# Patient Record
Sex: Female | Born: 1945
Health system: Southern US, Community
[De-identification: ages and names within clinical notes are randomized; demographics above are authoritative.]

## PROBLEM LIST (undated history)

## (undated) DIAGNOSIS — I10 Essential (primary) hypertension: Secondary | ICD-10-CM

## (undated) DIAGNOSIS — E119 Type 2 diabetes mellitus without complications: Secondary | ICD-10-CM

---

## 2005-05-23 ENCOUNTER — Ambulatory Visit: Payer: Self-pay | Admitting: Family Medicine

## 2005-06-03 ENCOUNTER — Ambulatory Visit: Payer: Self-pay | Admitting: Family Medicine

## 2005-06-19 ENCOUNTER — Ambulatory Visit (HOSPITAL_COMMUNITY): Admission: RE | Admit: 2005-06-19 | Discharge: 2005-06-19 | Payer: Self-pay | Admitting: Family Medicine

## 2005-07-02 ENCOUNTER — Ambulatory Visit: Payer: Self-pay | Admitting: Family Medicine

## 2005-07-30 ENCOUNTER — Ambulatory Visit: Payer: Self-pay | Admitting: *Deleted

## 2005-08-06 ENCOUNTER — Ambulatory Visit: Payer: Self-pay | Admitting: Family Medicine

## 2005-08-06 ENCOUNTER — Encounter (INDEPENDENT_AMBULATORY_CARE_PROVIDER_SITE_OTHER): Payer: Self-pay | Admitting: Family Medicine

## 2005-08-12 ENCOUNTER — Ambulatory Visit: Payer: Self-pay | Admitting: Family Medicine

## 2005-09-12 ENCOUNTER — Ambulatory Visit: Payer: Self-pay | Admitting: Family Medicine

## 2005-09-19 ENCOUNTER — Emergency Department (HOSPITAL_COMMUNITY): Admission: EM | Admit: 2005-09-19 | Discharge: 2005-09-19 | Payer: Self-pay | Admitting: Emergency Medicine

## 2005-10-14 ENCOUNTER — Ambulatory Visit: Payer: Self-pay | Admitting: Family Medicine

## 2005-11-12 ENCOUNTER — Ambulatory Visit: Payer: Self-pay | Admitting: Family Medicine

## 2005-11-14 ENCOUNTER — Ambulatory Visit: Payer: Self-pay | Admitting: Family Medicine

## 2006-01-02 ENCOUNTER — Ambulatory Visit: Payer: Self-pay | Admitting: Family Medicine

## 2006-01-09 ENCOUNTER — Ambulatory Visit: Payer: Self-pay | Admitting: Internal Medicine

## 2006-01-24 ENCOUNTER — Ambulatory Visit: Payer: Self-pay | Admitting: Internal Medicine

## 2006-03-06 ENCOUNTER — Encounter (INDEPENDENT_AMBULATORY_CARE_PROVIDER_SITE_OTHER): Payer: Self-pay | Admitting: Family Medicine

## 2006-03-06 ENCOUNTER — Ambulatory Visit: Payer: Self-pay | Admitting: Internal Medicine

## 2006-03-06 LAB — CONVERTED CEMR LAB: TSH: 1.614 microintl units/mL

## 2006-04-10 ENCOUNTER — Encounter (INDEPENDENT_AMBULATORY_CARE_PROVIDER_SITE_OTHER): Payer: Self-pay | Admitting: Family Medicine

## 2006-04-10 ENCOUNTER — Ambulatory Visit: Payer: Self-pay | Admitting: Internal Medicine

## 2006-04-10 LAB — CONVERTED CEMR LAB: Hgb A1c MFr Bld: 9.2 %

## 2006-06-02 ENCOUNTER — Ambulatory Visit: Payer: Self-pay | Admitting: Family Medicine

## 2006-06-23 ENCOUNTER — Encounter: Admission: RE | Admit: 2006-06-23 | Discharge: 2006-06-23 | Payer: Self-pay | Admitting: Internal Medicine

## 2006-07-14 ENCOUNTER — Ambulatory Visit: Payer: Self-pay | Admitting: Internal Medicine

## 2006-07-14 ENCOUNTER — Encounter (INDEPENDENT_AMBULATORY_CARE_PROVIDER_SITE_OTHER): Payer: Self-pay | Admitting: Family Medicine

## 2006-07-14 LAB — CONVERTED CEMR LAB: Hgb A1c MFr Bld: 7.4 %

## 2006-07-23 ENCOUNTER — Encounter (INDEPENDENT_AMBULATORY_CARE_PROVIDER_SITE_OTHER): Payer: Self-pay | Admitting: Family Medicine

## 2006-07-23 DIAGNOSIS — I679 Cerebrovascular disease, unspecified: Secondary | ICD-10-CM

## 2006-07-23 DIAGNOSIS — Z8679 Personal history of other diseases of the circulatory system: Secondary | ICD-10-CM | POA: Insufficient documentation

## 2006-07-23 DIAGNOSIS — E785 Hyperlipidemia, unspecified: Secondary | ICD-10-CM | POA: Insufficient documentation

## 2006-07-23 DIAGNOSIS — E119 Type 2 diabetes mellitus without complications: Secondary | ICD-10-CM

## 2006-07-23 DIAGNOSIS — E039 Hypothyroidism, unspecified: Secondary | ICD-10-CM | POA: Insufficient documentation

## 2006-07-23 DIAGNOSIS — I10 Essential (primary) hypertension: Secondary | ICD-10-CM | POA: Insufficient documentation

## 2006-07-23 DIAGNOSIS — M5410 Radiculopathy, site unspecified: Secondary | ICD-10-CM

## 2006-07-23 DIAGNOSIS — M7700 Medial epicondylitis, unspecified elbow: Secondary | ICD-10-CM

## 2006-07-23 DIAGNOSIS — E1149 Type 2 diabetes mellitus with other diabetic neurological complication: Secondary | ICD-10-CM

## 2006-10-01 ENCOUNTER — Encounter (INDEPENDENT_AMBULATORY_CARE_PROVIDER_SITE_OTHER): Payer: Self-pay | Admitting: *Deleted

## 2006-10-23 ENCOUNTER — Telehealth (INDEPENDENT_AMBULATORY_CARE_PROVIDER_SITE_OTHER): Payer: Self-pay | Admitting: *Deleted

## 2006-11-24 ENCOUNTER — Ambulatory Visit: Payer: Self-pay | Admitting: Family Medicine

## 2006-11-24 LAB — CONVERTED CEMR LAB
ALT: 14 units/L (ref 0–35)
AST: 17 units/L (ref 0–37)
Albumin: 4.6 g/dL (ref 3.5–5.2)
Blood Glucose, Fingerstick: 114
CO2: 25 meq/L (ref 19–32)
Calcium: 9.9 mg/dL (ref 8.4–10.5)
Cholesterol: 152 mg/dL (ref 0–200)
Eosinophils Relative: 2 % (ref 0–5)
Hgb A1c MFr Bld: 6.6 %
LDL Cholesterol: 61 mg/dL (ref 0–99)
Lymphocytes Relative: 44 % (ref 12–46)
Lymphs Abs: 2.7 10*3/uL (ref 0.7–4.0)
MCHC: 30.8 g/dL (ref 30.0–36.0)
Monocytes Relative: 7 % (ref 3–12)
Neutro Abs: 2.9 10*3/uL (ref 1.7–7.7)
Platelets: 301 10*3/uL (ref 150–400)
Protein, U semiquant: 30
RBC: 4.95 M/uL (ref 3.87–5.11)
RDW: 14.9 % (ref 11.5–15.5)
Specific Gravity, Urine: 1.015
TSH: 1.584 microintl units/mL (ref 0.350–5.50)
Total Bilirubin: 0.4 mg/dL (ref 0.3–1.2)
Total CHOL/HDL Ratio: 2
Triglycerides: 81 mg/dL (ref <150)
Urobilinogen, UA: 0.2
VLDL: 16 mg/dL (ref 0–40)
pH: 6

## 2007-01-26 ENCOUNTER — Ambulatory Visit: Payer: Self-pay | Admitting: Nurse Practitioner

## 2007-01-26 DIAGNOSIS — R319 Hematuria, unspecified: Secondary | ICD-10-CM

## 2007-01-26 DIAGNOSIS — N3090 Cystitis, unspecified without hematuria: Secondary | ICD-10-CM | POA: Insufficient documentation

## 2007-01-26 LAB — CONVERTED CEMR LAB
Hgb A1c MFr Bld: 7.2 %
pH: 7.5

## 2007-01-27 ENCOUNTER — Encounter (INDEPENDENT_AMBULATORY_CARE_PROVIDER_SITE_OTHER): Payer: Self-pay | Admitting: Nurse Practitioner

## 2007-01-29 ENCOUNTER — Encounter (INDEPENDENT_AMBULATORY_CARE_PROVIDER_SITE_OTHER): Payer: Self-pay | Admitting: Nurse Practitioner

## 2007-02-09 ENCOUNTER — Ambulatory Visit: Payer: Self-pay | Admitting: Family Medicine

## 2007-02-09 LAB — CONVERTED CEMR LAB
Nitrite: NEGATIVE
Specific Gravity, Urine: 1.02
pH: 5

## 2007-02-23 ENCOUNTER — Ambulatory Visit (HOSPITAL_COMMUNITY): Admission: RE | Admit: 2007-02-23 | Discharge: 2007-02-23 | Payer: Self-pay | Admitting: Family Medicine

## 2007-02-23 ENCOUNTER — Encounter (INDEPENDENT_AMBULATORY_CARE_PROVIDER_SITE_OTHER): Payer: Self-pay | Admitting: Family Medicine

## 2007-03-25 ENCOUNTER — Telehealth (INDEPENDENT_AMBULATORY_CARE_PROVIDER_SITE_OTHER): Payer: Self-pay | Admitting: *Deleted

## 2007-06-22 ENCOUNTER — Ambulatory Visit: Payer: Self-pay | Admitting: Family Medicine

## 2007-06-22 DIAGNOSIS — M899 Disorder of bone, unspecified: Secondary | ICD-10-CM | POA: Insufficient documentation

## 2007-06-22 DIAGNOSIS — M949 Disorder of cartilage, unspecified: Secondary | ICD-10-CM

## 2007-06-22 LAB — CONVERTED CEMR LAB: Blood Glucose, Fingerstick: 122

## 2007-07-07 LAB — CONVERTED CEMR LAB
ALT: 12 units/L (ref 0–35)
Basophils Relative: 0 % (ref 0–1)
Calcium: 9.1 mg/dL (ref 8.4–10.5)
Chloride: 105 meq/L (ref 96–112)
Creatinine, Ser: 0.6 mg/dL (ref 0.40–1.20)
HDL: 78 mg/dL (ref >39)
Hemoglobin: 12.4 g/dL (ref 12.0–15.0)
Lymphocytes Relative: 42 % (ref 12–46)
Monocytes Absolute: 0.5 10*3/uL (ref 0.1–1.0)
Monocytes Relative: 7 % (ref 3–12)
Neutrophils Relative %: 48 % (ref 43–77)
Platelets: 324 10*3/uL (ref 150–400)
RDW: 14.9 % (ref 11.5–15.5)
TSH: 6.216 microintl units/mL — ABNORMAL HIGH (ref 0.350–5.50)
Total CHOL/HDL Ratio: 2.6
Total Protein: 8.2 g/dL (ref 6.0–8.3)

## 2007-08-31 ENCOUNTER — Ambulatory Visit: Payer: Self-pay | Admitting: Family Medicine

## 2007-10-14 LAB — CONVERTED CEMR LAB: TSH: 5.113 microintl units/mL — ABNORMAL HIGH (ref 0.350–4.50)

## 2007-11-19 ENCOUNTER — Telehealth (INDEPENDENT_AMBULATORY_CARE_PROVIDER_SITE_OTHER): Payer: Self-pay | Admitting: Family Medicine

## 2007-11-23 ENCOUNTER — Emergency Department (HOSPITAL_COMMUNITY): Admission: EM | Admit: 2007-11-23 | Discharge: 2007-11-24 | Payer: Self-pay | Admitting: Emergency Medicine

## 2007-11-30 ENCOUNTER — Ambulatory Visit: Payer: Self-pay | Admitting: Family Medicine

## 2007-11-30 DIAGNOSIS — S86819A Strain of other muscle(s) and tendon(s) at lower leg level, unspecified leg, initial encounter: Secondary | ICD-10-CM

## 2007-11-30 DIAGNOSIS — S838X9A Sprain of other specified parts of unspecified knee, initial encounter: Secondary | ICD-10-CM | POA: Insufficient documentation

## 2008-01-25 ENCOUNTER — Ambulatory Visit: Payer: Self-pay | Admitting: Family Medicine

## 2008-01-25 LAB — CONVERTED CEMR LAB: TSH: 0.568 microintl units/mL (ref 0.350–4.50)

## 2008-04-25 ENCOUNTER — Telehealth (INDEPENDENT_AMBULATORY_CARE_PROVIDER_SITE_OTHER): Payer: Self-pay | Admitting: Family Medicine

## 2008-05-03 ENCOUNTER — Encounter (INDEPENDENT_AMBULATORY_CARE_PROVIDER_SITE_OTHER): Payer: Self-pay | Admitting: Family Medicine

## 2008-05-03 ENCOUNTER — Ambulatory Visit: Payer: Self-pay | Admitting: Family Medicine

## 2008-05-09 ENCOUNTER — Ambulatory Visit: Payer: Self-pay | Admitting: Family Medicine

## 2008-05-09 LAB — CONVERTED CEMR LAB
Blood Glucose, Fingerstick: 151
Hgb A1c MFr Bld: 7.6 %
Microalb, Ur: 1.13 mg/dL (ref 0.00–1.89)

## 2008-05-25 ENCOUNTER — Telehealth (INDEPENDENT_AMBULATORY_CARE_PROVIDER_SITE_OTHER): Payer: Self-pay | Admitting: Family Medicine

## 2008-06-06 ENCOUNTER — Ambulatory Visit: Payer: Self-pay | Admitting: Family Medicine

## 2008-06-06 LAB — CONVERTED CEMR LAB
BUN: 11 mg/dL (ref 6–23)
CO2: 25 meq/L (ref 19–32)
Chloride: 105 meq/L (ref 96–112)
Glucose, Bld: 102 mg/dL — ABNORMAL HIGH (ref 70–99)
HDL: 75 mg/dL (ref >39)
Potassium: 4.5 meq/L (ref 3.5–5.3)
Sodium: 141 meq/L (ref 135–145)
Triglycerides: 80 mg/dL (ref <150)

## 2008-06-07 ENCOUNTER — Encounter (INDEPENDENT_AMBULATORY_CARE_PROVIDER_SITE_OTHER): Payer: Self-pay | Admitting: Family Medicine

## 2008-12-26 ENCOUNTER — Ambulatory Visit: Payer: Self-pay | Admitting: Physician Assistant

## 2008-12-26 LAB — CONVERTED CEMR LAB
Blood Glucose, AC Bkfst: 93 mg/dL
Cholesterol, target level: 200 mg/dL

## 2008-12-27 LAB — CONVERTED CEMR LAB
ALT: 10 units/L (ref 0–35)
AST: 12 units/L (ref 0–37)
Albumin: 4.2 g/dL (ref 3.5–5.2)
CO2: 27 meq/L (ref 19–32)
Calcium: 9.2 mg/dL (ref 8.4–10.5)
Chloride: 105 meq/L (ref 96–112)
Creatinine, Ser: 0.62 mg/dL (ref 0.40–1.20)
Glucose, Bld: 95 mg/dL (ref 70–99)
HDL: 77 mg/dL (ref >39)
TSH: 3.813 microintl units/mL (ref 0.350–4.500)
Total Bilirubin: 0.4 mg/dL (ref 0.3–1.2)
Total Protein: 7.5 g/dL (ref 6.0–8.3)
Triglycerides: 114 mg/dL (ref <150)
VLDL: 23 mg/dL (ref 0–40)

## 2009-01-03 ENCOUNTER — Telehealth: Payer: Self-pay | Admitting: Physician Assistant

## 2009-03-16 ENCOUNTER — Emergency Department (HOSPITAL_COMMUNITY): Admission: EM | Admit: 2009-03-16 | Discharge: 2009-03-17 | Payer: Self-pay | Admitting: Emergency Medicine

## 2009-03-20 ENCOUNTER — Ambulatory Visit: Payer: Self-pay | Admitting: Physician Assistant

## 2009-03-20 DIAGNOSIS — L0291 Cutaneous abscess, unspecified: Secondary | ICD-10-CM | POA: Insufficient documentation

## 2009-03-20 DIAGNOSIS — L0390 Cellulitis, unspecified: Secondary | ICD-10-CM

## 2009-03-20 DIAGNOSIS — I8390 Asymptomatic varicose veins of unspecified lower extremity: Secondary | ICD-10-CM

## 2009-03-20 LAB — CONVERTED CEMR LAB: Blood Glucose, Fingerstick: 179

## 2009-03-21 DIAGNOSIS — R809 Proteinuria, unspecified: Secondary | ICD-10-CM

## 2009-03-21 LAB — CONVERTED CEMR LAB
ALT: 12 units/L (ref 0–35)
AST: 15 units/L (ref 0–37)
Albumin: 4.4 g/dL (ref 3.5–5.2)
CO2: 26 meq/L (ref 19–32)
Calcium: 9.3 mg/dL (ref 8.4–10.5)
Total Bilirubin: 0.3 mg/dL (ref 0.3–1.2)
Total Protein: 7.4 g/dL (ref 6.0–8.3)

## 2009-03-28 ENCOUNTER — Ambulatory Visit: Payer: Self-pay | Admitting: Physician Assistant

## 2009-03-28 ENCOUNTER — Ambulatory Visit (HOSPITAL_COMMUNITY): Admission: RE | Admit: 2009-03-28 | Discharge: 2009-03-28 | Payer: Self-pay | Admitting: Internal Medicine

## 2009-03-28 DIAGNOSIS — R82998 Other abnormal findings in urine: Secondary | ICD-10-CM

## 2009-03-28 DIAGNOSIS — R0989 Other specified symptoms and signs involving the circulatory and respiratory systems: Secondary | ICD-10-CM

## 2009-03-28 LAB — CONVERTED CEMR LAB
KOH Prep: NEGATIVE
Ketones, urine, test strip: NEGATIVE
Protein, U semiquant: NEGATIVE

## 2009-03-29 ENCOUNTER — Encounter: Payer: Self-pay | Admitting: Physician Assistant

## 2009-03-29 LAB — CONVERTED CEMR LAB
Bacteria, UA: NONE SEEN
Basophils Relative: 0 % (ref 0–1)
Bilirubin Urine: NEGATIVE
Eosinophils Absolute: 0.3 10*3/uL (ref 0.0–0.7)
HCT: 36.9 % (ref 36.0–46.0)
Hemoglobin, Urine: NEGATIVE
Ketones, ur: NEGATIVE mg/dL
Leukocytes, UA: NEGATIVE
Lymphocytes Relative: 35 % (ref 12–46)
MCHC: 31.4 g/dL (ref 30.0–36.0)
Monocytes Absolute: 0.7 10*3/uL (ref 0.1–1.0)
Neutro Abs: 3.8 10*3/uL (ref 1.7–7.7)
Neutrophils Relative %: 52 % (ref 43–77)
Nitrite: NEGATIVE
Platelets: 309 10*3/uL (ref 150–400)
Protein, ur: NEGATIVE mg/dL
Specific Gravity, Urine: 1.027 (ref 1.005–1.030)
Urine Glucose: NEGATIVE mg/dL
Urobilinogen, UA: 0.2 (ref 0.0–1.0)
WBC: 7.3 10*3/uL (ref 4.0–10.5)

## 2009-03-30 ENCOUNTER — Encounter: Payer: Self-pay | Admitting: Physician Assistant

## 2009-04-04 ENCOUNTER — Ambulatory Visit: Payer: Self-pay | Admitting: Physician Assistant

## 2009-04-04 ENCOUNTER — Encounter: Payer: Self-pay | Admitting: Physician Assistant

## 2009-04-05 ENCOUNTER — Encounter: Payer: Self-pay | Admitting: Physician Assistant

## 2009-04-05 LAB — CONVERTED CEMR LAB
Ferritin: 39 ng/mL (ref 10–291)
RBC Folate: 485 ng/mL (ref 180–600)
Vitamin B-12: 253 pg/mL (ref 211–911)

## 2009-04-10 ENCOUNTER — Ambulatory Visit: Payer: Self-pay | Admitting: Physician Assistant

## 2009-04-18 ENCOUNTER — Telehealth: Payer: Self-pay | Admitting: Physician Assistant

## 2009-04-18 ENCOUNTER — Ambulatory Visit (HOSPITAL_COMMUNITY)
Admission: RE | Admit: 2009-04-18 | Discharge: 2009-04-18 | Payer: Self-pay | Source: Home / Self Care | Admitting: Internal Medicine

## 2009-04-18 ENCOUNTER — Encounter: Payer: Self-pay | Admitting: Physician Assistant

## 2009-05-01 ENCOUNTER — Ambulatory Visit: Payer: Self-pay | Admitting: Physician Assistant

## 2009-05-01 LAB — CONVERTED CEMR LAB
CO2: 27 meq/L (ref 19–32)
Calcium: 8.5 mg/dL (ref 8.4–10.5)
Chloride: 106 meq/L (ref 96–112)
Cholesterol: 155 mg/dL (ref 0–200)
Potassium: 4.1 meq/L (ref 3.5–5.3)
Sodium: 141 meq/L (ref 135–145)
Triglycerides: 91 mg/dL (ref <150)

## 2009-05-02 ENCOUNTER — Encounter: Payer: Self-pay | Admitting: Physician Assistant

## 2009-05-04 ENCOUNTER — Encounter: Payer: Self-pay | Admitting: Physician Assistant

## 2009-05-07 ENCOUNTER — Encounter: Payer: Self-pay | Admitting: Physician Assistant

## 2009-06-05 ENCOUNTER — Encounter: Payer: Self-pay | Admitting: Physician Assistant

## 2009-07-03 ENCOUNTER — Ambulatory Visit: Payer: Self-pay | Admitting: Physician Assistant

## 2009-07-03 ENCOUNTER — Telehealth: Payer: Self-pay | Admitting: Physician Assistant

## 2009-07-03 DIAGNOSIS — M722 Plantar fascial fibromatosis: Secondary | ICD-10-CM | POA: Insufficient documentation

## 2009-07-03 LAB — CONVERTED CEMR LAB: Hgb A1c MFr Bld: 8 %

## 2009-07-05 LAB — CONVERTED CEMR LAB: TSH: 4.677 microintl units/mL — ABNORMAL HIGH (ref 0.350–4.500)

## 2009-07-14 ENCOUNTER — Encounter: Payer: Self-pay | Admitting: Physician Assistant

## 2009-07-28 ENCOUNTER — Ambulatory Visit: Payer: Self-pay | Admitting: Physician Assistant

## 2009-07-28 ENCOUNTER — Telehealth: Payer: Self-pay | Admitting: Physician Assistant

## 2009-07-28 LAB — CONVERTED CEMR LAB: Blood Glucose, Fingerstick: 115

## 2009-07-31 ENCOUNTER — Ambulatory Visit: Payer: Self-pay | Admitting: Vascular Surgery

## 2009-07-31 ENCOUNTER — Encounter: Payer: Self-pay | Admitting: Physician Assistant

## 2009-08-01 ENCOUNTER — Ambulatory Visit: Payer: Self-pay | Admitting: Physician Assistant

## 2009-08-03 ENCOUNTER — Encounter (INDEPENDENT_AMBULATORY_CARE_PROVIDER_SITE_OTHER): Payer: Self-pay | Admitting: *Deleted

## 2009-08-11 ENCOUNTER — Encounter (INDEPENDENT_AMBULATORY_CARE_PROVIDER_SITE_OTHER): Payer: Self-pay | Admitting: *Deleted

## 2009-08-14 ENCOUNTER — Ambulatory Visit: Payer: Self-pay | Admitting: Gastroenterology

## 2009-08-14 ENCOUNTER — Ambulatory Visit: Payer: Self-pay | Admitting: Physician Assistant

## 2009-08-14 ENCOUNTER — Encounter (INDEPENDENT_AMBULATORY_CARE_PROVIDER_SITE_OTHER): Payer: Self-pay | Admitting: *Deleted

## 2009-08-22 ENCOUNTER — Ambulatory Visit: Payer: Self-pay | Admitting: Gastroenterology

## 2009-08-23 ENCOUNTER — Encounter: Payer: Self-pay | Admitting: Physician Assistant

## 2009-10-16 ENCOUNTER — Ambulatory Visit: Payer: Self-pay | Admitting: Physician Assistant

## 2009-10-16 DIAGNOSIS — K0500 Acute gingivitis, plaque induced: Secondary | ICD-10-CM | POA: Insufficient documentation

## 2009-10-17 LAB — CONVERTED CEMR LAB
Basophils Absolute: 0 10*3/uL (ref 0.0–0.1)
Basophils Relative: 0 % (ref 0–1)
Eosinophils Absolute: 0.1 10*3/uL (ref 0.0–0.7)
Eosinophils Relative: 2 % (ref 0–5)
Free T4: 0.99 ng/dL (ref 0.80–1.80)
HCT: 41.4 % (ref 36.0–46.0)
Lymphs Abs: 2.5 10*3/uL (ref 0.7–4.0)
MCV: 79.9 fL (ref 78.0–100.0)
Neutrophils Relative %: 50 % (ref 43–77)
Platelets: 327 10*3/uL (ref 150–400)
RDW: 15.2 % (ref 11.5–15.5)
TSH: 3.541 microintl units/mL (ref 0.350–4.500)
WBC: 6 10*3/uL (ref 4.0–10.5)

## 2009-12-11 ENCOUNTER — Ambulatory Visit: Payer: Self-pay | Admitting: Internal Medicine

## 2010-01-21 ENCOUNTER — Telehealth (INDEPENDENT_AMBULATORY_CARE_PROVIDER_SITE_OTHER): Payer: Self-pay | Admitting: Internal Medicine

## 2010-01-22 ENCOUNTER — Ambulatory Visit: Admit: 2010-01-22 | Payer: Self-pay | Admitting: Physician Assistant

## 2010-02-03 ENCOUNTER — Emergency Department (HOSPITAL_COMMUNITY)
Admission: EM | Admit: 2010-02-03 | Discharge: 2010-02-03 | Payer: Self-pay | Source: Home / Self Care | Admitting: Emergency Medicine

## 2010-02-04 ENCOUNTER — Encounter: Payer: Self-pay | Admitting: Family Medicine

## 2010-02-06 LAB — URINALYSIS, ROUTINE W REFLEX MICROSCOPIC
Bilirubin Urine: NEGATIVE
Ketones, ur: NEGATIVE mg/dL
Protein, ur: NEGATIVE mg/dL
Specific Gravity, Urine: 1.015 (ref 1.005–1.030)
Urobilinogen, UA: 0.2 mg/dL (ref 0.0–1.0)

## 2010-02-06 LAB — POCT I-STAT, CHEM 8
Calcium, Ion: 1.17 mmol/L (ref 1.12–1.32)
Chloride: 101 mEq/L (ref 96–112)
Glucose, Bld: 86 mg/dL (ref 70–99)
HCT: 41 % (ref 36.0–46.0)
Hemoglobin: 13.9 g/dL (ref 12.0–15.0)
TCO2: 29 mmol/L (ref 0–100)

## 2010-02-06 LAB — URINE MICROSCOPIC-ADD ON

## 2010-02-06 LAB — GLUCOSE, CAPILLARY

## 2010-02-13 NOTE — Miscellaneous (Signed)
  Clinical Lists Changes  Problems: Assessed ABDOMINAL BRUIT as comment only - renal duplex: no RA stenosis inc velocities in prox and mid SMA  Observations: Added new observation of PAST MED HX: Current Problems:  MEDIAL EPICONDYLITIS, LEFT (ICD-726.31) DIABETIC PERIPHERAL NEUROPATHY (ICD-250.60) CEREBROVASCULAR DISEASE (ICD-437.9) TRANSIENT ISCHEMIC ATTACK, HX OF (ICD-V12.50) HYPOTHYROIDISM (ICD-244.9) HYPERTENSION (ICD-401.9) HYPERLIPIDEMIA (ICD-272.4) DIABETES MELLITUS, TYPE II (ICD-250.00) EKG done 09/12/05 Renal duplex 07/2009:  no RA stenosis; bilat kidney lengths normal; no celiac art stenosis but increased velocities noted in prox and mid SMA (08/23/2009 22:55)       Past History:  Past Medical History: Current Problems:  MEDIAL EPICONDYLITIS, LEFT (ICD-726.31) DIABETIC PERIPHERAL NEUROPATHY (ICD-250.60) CEREBROVASCULAR DISEASE (ICD-437.9) TRANSIENT ISCHEMIC ATTACK, HX OF (ICD-V12.50) HYPOTHYROIDISM (ICD-244.9) HYPERTENSION (ICD-401.9) HYPERLIPIDEMIA (ICD-272.4) DIABETES MELLITUS, TYPE II (ICD-250.00) EKG done 09/12/05 Renal duplex 07/2009:  no RA stenosis; bilat kidney lengths normal; no celiac art stenosis but increased velocities noted in prox and mid SMA   Impression & Recommendations:  Problem # 1:  ABDOMINAL BRUIT (ICD-785.9) renal duplex: no RA stenosis inc velocities in prox and mid SMA  Complete Medication List: 1)  Norvasc 10 Mg Tabs (Amlodipine besylate) .... Take 1 tablet by mouth once a day 2)  Diovan Hct 320-25 Mg Tabs (Valsartan-hydrochlorothiazide) .... Take 1 tablet by mouth once a day 3)  Aspir-low 81 Mg Tbec (Aspirin) .... Take 1 tablet by mouth once a day with food 4)  Crestor 40 Mg Tabs (Rosuvastatin calcium) .... Take 1 tablet by mouth once a day 5)  Actos 30 Mg Tabs (Pioglitazone hcl) .Marland Kitchen.. 1 by mouth once daily 6)  Glucophage Xr 500 Mg Tb24 (Metformin hcl) .... 2 by mouth two times a day 7)  Fosamax 70 Mg Tabs (Alendronate sodium)  .... Take one pill each week for bone strength 8)  Levothroid 125 Mcg Tabs (Levothyroxine sodium) .... Take 1 tablet by mouth once a day 9)  Caltrate 600+d Plus 600-400 Mg-unit Tabs (Calcium carbonate-vit d-min) .... Take 1 tablet by mouth two times a day 10)  Glucose Meter  .... Check sugar once daily 11)  Glucose Meter Strips  .... Check sugar once daily 12)  Lancets  .... Check sugar once daily 13)  Glucotrol Xl 5 Mg Xr24h-tab (Glipizide) .... Take 1 tablet by mouth once a day with a meal for diabetes.  please write in spanish. 14)  Diclofenac Sodium 75 Mg Tbec (Diclofenac sodium) .... Take 1 tablet by mouth two times a day with food as needed for pain (please write in spanish) 15)  Doxycycline Hyclate 100 Mg Tabs (Doxycycline hyclate) .Marland Kitchen.. 1 tab by mouth two times a day for 10 days 16)  Moviprep 100 Gm Solr (Peg-kcl-nacl-nasulf-na asc-c) .... As per prep instructions.

## 2010-02-13 NOTE — Letter (Signed)
Summary: TEST ORDER FORM//DEXA SCAN  TEST ORDER FORM//DEXA SCAN   Imported By: Arta Bruce 06/15/2009 12:16:17  _____________________________________________________________________  External Attachment:    Type:   Image     Comment:   External Document

## 2010-02-13 NOTE — Progress Notes (Signed)
Summary: Large Pimple infected//gk  Phone Note Call from Patient Call back at [phone]   Summary of Call: Pt has three or for pimples in her back and the large one is infected and she wants to come to see the provider if that is possible today (urgent).  Pt can come anytime before 4 pm. Alben Spittle PA-c Initial call taken by: Manon Hilding,  July 28, 2009 8:09 AM  Follow-up for Phone Call        States has large pimple on her back, very painful, draining purulent fluid through her clothes.  Appt. with triage nurse this AM.  Follow-up by: Dutch Quint RN,  July 28, 2009 10:59 AM  Additional Follow-up for Phone Call Additional follow up Details #1::        In office for visit.   Additional Follow-up by: Dutch Quint RN,  July 28, 2009 12:40 PM

## 2010-02-13 NOTE — Assessment & Plan Note (Signed)
Summary: *wound check//mm  Nurse Visit   Primary Care Provider:  Tereso Newcomer PA-C  CC:  f/u epidermoid cyst.  History of Present Illness: Seen in office Friday for painful, closed epidermoid cyst.  Cyst had I&D performed -- here for f/u wound check.  Provider notes:  pt. did not get Doxy filled--was taking Amoxicillin for dental problem.   Review of Systems Derm:  Open wound remains reddened, states thick, yellow exudate continues to drain from wound.  Has two other areas that have black centers, raised to mid back, closed.Marland Kitchen   Physical Exam  Skin:  Area of erythema about I and D  wound of infected epidermoid cyst less erythematous --some cheesy substance expressed again today with little discomfort.   No fluctuanc.    Impression & Recommendations:  Problem # 1:  EPIDERMOID CYST, INFECTED (ICD-706.2) Improved To get started on Doxycycline Orders: Dressing 4x4 UP (F6213)  Complete Medication List: 1)  Norvasc 10 Mg Tabs (Amlodipine besylate) .... Take 1 tablet by mouth once a day 2)  Diovan Hct 320-25 Mg Tabs (Valsartan-hydrochlorothiazide) .... Take 1 tablet by mouth once a day 3)  Aspir-low 81 Mg Tbec (Aspirin) .... Take 1 tablet by mouth once a day with food 4)  Crestor 40 Mg Tabs (Rosuvastatin calcium) .... Take 1 tablet by mouth once a day 5)  Actos 30 Mg Tabs (Pioglitazone hcl) .Marland Kitchen.. 1 by mouth once daily 6)  Glucophage Xr 500 Mg Tb24 (Metformin hcl) .... 2 by mouth two times a day 7)  Fosamax 70 Mg Tabs (Alendronate sodium) .... Take one pill each week for bone strength 8)  Levothroid 125 Mcg Tabs (Levothyroxine sodium) .... Take 1 tablet by mouth once a day 9)  Caltrate 600+d Plus 600-400 Mg-unit Tabs (Calcium carbonate-vit d-min) .... Take 1 tablet by mouth two times a day 10)  Glucose Meter  .... Check sugar once daily 11)  Glucose Meter Strips  .... Check sugar once daily 12)  Lancets  .... Check sugar once daily 13)  Glucotrol Xl 5 Mg Xr24h-tab (Glipizide) ....  Take 1 tablet by mouth once a day with a meal for diabetes.  please write in spanish. 14)  Diclofenac Sodium 75 Mg Tbec (Diclofenac sodium) .... Take 1 tablet by mouth two times a day with food as needed for pain (please write in spanish) 15)  Doxycycline Hyclate 100 Mg Tabs (Doxycycline hyclate) .Marland Kitchen.. 1 tab by mouth two times a day for 10 days   Patient Instructions: 1)  Warm compresses two times a day, Stop Amoxicillin and begin Doxy. ready to pickup @ [facility]. RTC if area becomes worse or does not heal after completing antibiotics.  CC: f/u epidermoid cyst   Allergies: No Known Drug Allergies  Orders Added: 1)  Dressing 4x4 UP [A6402] 2)  Est. Patient Level II [08657]

## 2010-02-13 NOTE — Miscellaneous (Signed)
Summary: Retasure Normal  Clinical Lists Changes  Observations: Added new observation of DIAB EYE EX: Retasure Normal (04/15/2009 23:08)

## 2010-02-13 NOTE — Letter (Signed)
Summary: Diabetic Instructions  [facility]  [address] [place], [place] [address]   Phone: [phone]  Fax: [phone]    Linda House 08/03/1946 MRN: [id number]   _x _   ORAL DIABETIC MEDICATION INSTRUCTIONS  The day before your procedure:   Take your diabetic pill as you do normally  The day of your procedure:   Do not take your diabetic pill    We will check your blood sugar levels during the admission process and again in Recovery before discharging you home  ________________________________________________________________________

## 2010-02-13 NOTE — Miscellaneous (Signed)
Summary: previsit/rm  Clinical Lists Changes  Medications: Added new medication of MOVIPREP 100 GM  SOLR (PEG-KCL-NACL-NASULF-NA ASC-C) As per prep instructions. - Signed Rx of MOVIPREP 100 GM  SOLR (PEG-KCL-NACL-NASULF-NA ASC-C) As per prep instructions.;  #1 x 0;  Signed;  Entered by: Sherren Kerns RN;  Authorized by: Mardella Layman MD [facility];  Method used: Samples Given Observations: Added new observation of ALLERGY REV: Done (08/14/2009 13:04)    Prescriptions: MOVIPREP 100 GM  SOLR (PEG-KCL-NACL-NASULF-NA ASC-C) As per prep instructions.  #1 x 0   Entered by:   Sherren Kerns RN   Authorized by:   Mardella Layman MD [facility]   Signed by:   Sherren Kerns RN on 08/14/2009   Method used:   Samples Given   RxID:   [id number]

## 2010-02-13 NOTE — Progress Notes (Signed)
Summary: [facility]  Phone Note From Other Clinic   Summary of Call: Diane, [facility] called in today because the pt has an aorta ultrasound, which was normal but they do not do rheno artery study.  If you heed any additional information, you can call back at [phone] [facility] Initial call taken by: Manon Hilding,  April 18, 2009 8:27 AM  Follow-up for Phone Call        [facility] does them. We should have sent her to the facility that does both. Schedule her renal arterial dopplers at [facility]. Follow-up by: Tereso Newcomer PA-C,  April 18, 2009 1:46 PM  Additional Follow-up for Phone Call Additional follow up Details #1::        called [facility] vascular  and they don't do it.... have to call [facility]... Armenia Shannon  April 21, 2009 2:36 PM     Additional Follow-up for Phone Call Additional follow up Details #2::    i called the vascular vein and they wants me to fax pt's order, referral sheet and information to them before scheduling pt's appt Follow-up by: Armenia Shannon,  April 24, 2009 2:35 PM   Appended Document: Orders Update Check with [facility] . . . they do them  ok... Armenia Shannon  May 03, 2009 5:06 PM    Clinical Lists Changes

## 2010-02-13 NOTE — Assessment & Plan Note (Signed)
Summary: cpp///cns   Vital Signs:  Patient profile:   65 year old female Menstrual status:  postmenopausal Height:      58 inches Weight:      167 pounds BMI:     35.03 Temp:     97.9 degrees F oral Pulse rate:   79 / minute Pulse rhythm:   regular Resp:     18 per minute BP sitting:   137 / 75  (left arm) Cuff size:   regular  Vitals Entered By: Arm[place] Shannon (March 28, 2009 3:21 PM) CC: cpp, Hypertension Management Is Patient Diabetic? Yes Pain Assessment Patient in pain? no       Does patient need assistance? Functional Status Self care Ambulation Normal   Primary Care Provider:  Tereso Newcomer PA-C  CC:  cpp and Hypertension Management.  History of Present Illness: Here for CPP No h/o abnormal pap. Postmenopausal. No vaginal bleeding. No odor, dishcarge or burning. Had mammo yest. Last DEXA 2009.  On fosamax.  Due for repeat this year. No h/o colonoscopy.   Hypertension History:      Positive major cardiovascular risk factors include female age 9 years old or older, diabetes, hyperlipidemia, and hypertension.  Negative major cardiovascular risk factors include negative family history for ischemic heart disease and non-tobacco-user status.    Habits & Providers  Alcohol-Tobacco-Diet     Alcohol drinks/day: 0     Tobacco Status: quit     Year Quit: 2006     Pack years: <10  Exercise-Depression-Behavior     Does Patient Exercise: yes     Type of exercise: walking     Exercise (avg: min/session): 30-60     Times/week: <3     Have you felt down or hopeless? no     Have you felt little pleasure in things? no     STD Risk: never     Drug Use: never     Seat Belt Use: always  Problems Prior to Update: 1)  Abdominal Bruit  (ICD-785.9) 2)  Family History Diabetes 1st Degree Relative  (ICD-V18.0) 3)  Urinalysis, Abnormal  (ICD-791.9) 4)  Microalbuminuria  (ICD-791.0) 5)  Varicose Veins, Lower Extremities  (ICD-454.9) 6)  Abscess, Skin  (ICD-682.9) 7)   Preventive Health Care  (ICD-V70.0) 8)  Muscle Strain, Hamstring Muscle  (ICD-844.8) 9)  Screening For Mlig Neop, Breast, Nos  (ICD-V76.10) 10)  Osteopenia  (ICD-733.90) 11)  Cystitis  (ICD-595.9) 12)  Hematuria Unspecified  (ICD-599.70) 13)  Medial Epicondylitis, Left  (ICD-726.31) 14)  Diabetic Peripheral Neuropathy  (ICD-250.60) 15)  Cerebrovascular Disease  (ICD-437.9) 16)  Transient Ischemic Attack, Hx of  (ICD-V12.50) 17)  Hypothyroidism  (ICD-244.9) 18)  Hypertension  (ICD-401.9) 19)  Hyperlipidemia  (ICD-272.4) 20)  Diabetes Mellitus, Type II  (ICD-250.00)  Current Medications (verified): 1)  Norvasc 10 Mg Tabs (Amlodipine Besylate) .... Take 1 Tablet By Mouth Once A Day 2)  Diovan Hct 320-12.5 Mg Tabs (Valsartan-Hydrochlorothiazide) .Marland Kitchen.. 1 By Mouth Q Am 3)  Aspir-Low 81 Mg Tbec (Aspirin) .... Take 1 Tablet By Mouth Once A Day With Food 4)  Crestor 40 Mg Tabs (Rosuvastatin Calcium) .... Take 1 Tablet By Mouth Once A Day 5)  Actos 30 Mg Tabs (Pioglitazone Hcl) .Marland Kitchen.. 1 By Mouth Once Daily 6)  Glucophage Xr 500 Mg  Tb24 (Metformin Hcl) .... 2 By Mouth Two Times A Day 7)  Fosamax 70 Mg  Tabs (Alendronate Sodium) .... Take One Pill Each Week For Bone Strength 8)  Levothroid  125 Mcg Tabs (Levothyroxine Sodium) .... Take 1 Tablet By Mouth Once A Day 9)  Naprosyn 500 Mg Tabs (Naproxen) .... 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) CEREBROVASCULAR DISEASE (ICD-437.9) TRANSIENT ISCHEMIC ATTACK, HX OF (ICD-V12.50) HYPOTHYROIDISM (ICD-244.9) HYPERTENSION (ICD-401.9) HYPERLIPIDEMIA (ICD-272.4) DIABETES MELLITUS, TYPE II (ICD-250.00) EKG done 09/12/05  Past Surgical History: Last updated: 07/23/2006 Tubal ligation 1982  Family History: Family History  Diabetes 1st degree relative - dad no colon CA no breast CA no ovarian CA  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         Rest of ROS negative.  Physical Exam  General:  alert, well-developed, and well-nourished.   Head:  normocephalic and atraumatic.   Eyes:  pupils equal, pupils round, and pupils reactive to light.   fundi diff to visualize Ears:  R ear normal and L ear normal.   Nose:  no external deformity.   Mouth:  pharynx pink and moist, no erythema, and no exudates.   Neck:  supple, no thyromegaly, no JVD, no carotid bruits, and no cervical lymphadenopathy.   Breasts:  skin/areolae normal, no masses, no abnormal thickening, no nipple discharge, no tenderness, and no adenopathy.   Lungs:  normal breath sounds, no crackles, and no wheezes.   Heart:  normal rate, regular rhythm, and no murmur.   Abdomen:  soft, non-tender, normal bowel sounds, and no hepatomegaly.   + abdominal bruit  Rectal:  no external abnormalities, no hemorrhoids, normal sphincter tone, no masses, no tenderness, no fissures, no fistulae, and no perianal rash.   Genitalia:  normal introitus, no external lesions, no vaginal discharge, mucosa pink and moist, no vaginal or cervical lesions, and no vaginal atrophy.   bleeding noted from insertion of speculum unable to palpate fundus or adnexae due to body habitus  Msk:  normal ROM.   Extremities:  no edema Neurologic:  alert & oriented X3, cranial nerves II-XII intact, strength normal in all extremities, and DTRs symmetrical and normal.   Skin:  turgor normal.   Psych:  normally interactive and good eye contact.     Impression & Recommendations:  Problem # 1:  PREVENTIVE HEALTH CARE  (ICD-V70.0) discussed colonoscopy . . . she is willing to proceed PHQ9=1  Orders: Hemoccult Cards -3 specimans (take home) (16109) Hemoccult Guaiac-1 spec.(in office) (82270) T-CBC w/Diff ([id number]) T-Pap Smear, Thin Prep (91478) KOH/ WET Mount [phone]) T-Urinalysis [phone]) Gastroenterology Referral (GI)  Problem # 2:  ABSCESS, SKIN (ICD-682.9)  will complete 14 days of doxy still has some purulent drainage advised her to use warm compresses  Her updated medication list for this problem includes:    Doxycycline Hyclate 100 Mg Tabs (Doxycycline hyclate) .Marland Kitchen... Take 1 tablet by mouth two times a day  Problem # 3:  DIABETES MELLITUS, TYPE II (ICD-250.00) to see dietician f/u in 3 mos  if A1C is above 7, add glipizide Retasure scheduled  Her updated medication list for this problem includes:    Diovan Hct 320-25 Mg Tabs (Valsartan-hydrochlorothiazide) .Marland Kitchen... Take 1 tablet by mouth once a day    Aspir-low  81 Mg Tbec (Aspirin) .Marland Kitchen... Take 1 tablet by mouth once a day with food    Actos 30 Mg Tabs (Pioglitazone hcl) .Marland Kitchen... 1 by mouth once daily    Glucophage Xr 500 Mg Tb24 (Metformin hcl) .Marland Kitchen... 2 by mouth two times a day  Orders: T-Urinalysis ([id number])  Problem # 4:  HYPERTENSION (ICD-401.9) still not at goal change diovan to 320/25 and recheck bmet in 2 weeks  Her updated medication list for this problem includes:    Norvasc 10 Mg Tabs (Amlodipine besylate) .Marland Kitchen... Take 1 tablet by mouth once a day    Diovan Hct 320-25 Mg Tabs (Valsartan-hydrochlorothiazide) .Marland Kitchen... Take 1 tablet by mouth once a day  Orders: T-Urinalysis ([id number])  Problem # 5:  HYPOTHYROIDISM (ICD-244.9) recent labs ok  Her updated medication list for this problem includes:    Levothroid 125 Mcg Tabs (Levothyroxine sodium) .Marland Kitchen... Take 1 tablet by mouth once a day  Problem # 6:  HYPERLIPIDEMIA (ICD-272.4) recent LFTs ok arrange FLP  Her updated medication list for this problem includes:     Crestor 40 Mg Tabs (Rosuvastatin calcium) .Marland Kitchen... Take 1 tablet by mouth once a day  Problem # 7:  ABDOMINAL BRUIT (ICD-785.9) check for AAA or renal artery stenosis Orders: Ultrasound (Ultrasound)  Problem # 8:  OSTEOPENIA (ICD-733.90) due for repeat DEXA  Her updated medication list for this problem includes:    Fosamax 70 Mg Tabs (Alendronate sodium) .Marland Kitchen... Take one pill each week for bone strength  Orders: Dexa scan (Dexa scan)  Complete Medication List: 1)  Norvasc 10 Mg Tabs (Amlodipine besylate) .... Take 1 tablet by mouth once a day 2)  Diovan Hct 320-25 Mg Tabs (Valsartan-hydrochlorothiazide) .... Take 1 tablet by mouth once a day 3)  Aspir-low 81 Mg Tbec (Aspirin) .... Take 1 tablet by mouth once a day with food 4)  Crestor 40 Mg Tabs (Rosuvastatin calcium) .... Take 1 tablet by mouth once a day 5)  Actos 30 Mg Tabs (Pioglitazone hcl) .Marland Kitchen.. 1 by mouth once daily 6)  Glucophage Xr 500 Mg Tb24 (Metformin hcl) .... 2 by mouth two times a day 7)  Fosamax 70 Mg Tabs (Alendronate sodium) .... Take one pill each week for bone strength 8)  Levothroid 125 Mcg Tabs (Levothyroxine sodium) .... Take 1 tablet by mouth once a day 9)  Naprosyn 500 Mg Tabs (Naproxen) .... Take 1 tablet by mouth every 12 hours as needed leg pain 10)  Doxycycline Hyclate 100 Mg Tabs (Doxycycline hyclate) .... Take 1 tablet by mouth two times a day  Other Orders: T- * Misc. Laboratory test [id number])  Hypertension Assessment/Plan:      The patient's hypertensive risk group is category C: Target organ damage and/or diabetes.  Her calculated 10 year risk of coronary heart disease is 13 %.  Today's blood pressure is 137/75.  Her blood pressure goal is < 130/80.   Patient Instructions: 1)  Please schedule a follow-up appointment in 3 months with Demetrice Amstutz for diabetes.  2)  Schedule appt with Susie Piper. 3)  Return to the lab 2 weeks after getting the new prescription for Diovan for a BMET (401.1). 4)  Return fasting  in next several weeks for Lipids (272.4).  Do not eat or drink anything after midnight except water. 5)  Warm compresses to left shoulder infection two times a day. 6)  Take antibiotic until it is all gone. Prescriptions: DIOVAN HCT 320-25 MG TABS (VALSARTAN-HYDROCHLOROTHIAZIDE) Take 1 tablet by mouth once a day  #  30 x 5   Entered and Authorized by:   Tereso Newcomer PA-C   Signed by:   Tereso Newcomer PA-C on 03/28/2009   Method used:   Print then Give to Patient   RxID:   [id number] DOXYCYCLINE HYCLATE 100 MG TABS (DOXYCYCLINE HYCLATE) Take 1 tablet by mouth two times a day  #8 x 0   Entered and Authorized by:   Tereso Newcomer PA-C   Signed by:   Tereso Newcomer PA-C on 03/28/2009   Method used:   Print then Give to Patient   RxID:   [id number]   Laboratory Results   Urine Tests  Date/Time Received: March 28, 2009 3:38 PM   Routine Urinalysis   Glucose: negative   (Normal Range: Negative) Bilirubin: negative   (Normal Range: Negative) Ketone: negative   (Normal Range: Negative) Spec. Gravity: >=1.030   (Normal Range: 1.003-1.035) Blood: trace-lysed   (Normal Range: Negative) pH: 5.5   (Normal Range: 5.0-8.0) Protein: negative   (Normal Range: Negative) Urobilinogen: 1.0   (Normal Range: 0-1) Nitrite: negative   (Normal Range: Negative) Leukocyte Esterace: negative   (Normal Range: Negative)     Blood Tests   Date/Time Received: March 28, 2009 3:39 PM   HGBA1C: 7.3%   (Normal Range: Non-Diabetic - 3-6%   Control Diabetic - 6-8%)    Wet Mount Source: vaginal WBC/hpf: 1-5 Bacteria/hpf: rare Clue cells/hpf: none Yeast/hpf: none Wet Mount KOH: Negative Trichomonas/hpf: none

## 2010-02-13 NOTE — Letter (Signed)
Summary: Linda House /TO PT  Letter/FROM Linda House /TO PT   Imported By: Arta Bruce 07/14/2009 09:31:38  _____________________________________________________________________  External Attachment:    Type:   Image     Comment:   External Document

## 2010-02-13 NOTE — Assessment & Plan Note (Signed)
Summary: 3 MONTH FU FOR DIABETES//KT   Vital Signs:  Patient profile:   65 year old female Menstrual status:  postmenopausal Height:      58 inches Weight:      164 pounds BMI:     34.40 Temp:     98.0 degrees F oral Pulse rate:   74 / minute Pulse rhythm:   regular Resp:     18 per minute BP sitting:   126 / 76  (left arm) Cuff size:   regular  Vitals Entered By: [place] Shannon (July 03, 2009 10:06 AM) CC: three month f/u.... pt says her feet hurt..., Hypertension Management Is Patient Diabetic? Yes Pain Assessment Patient in pain? no      CBG Result 114  Does patient need assistance? Functional Status Self care Ambulation Normal   Primary Care Provider:  Tereso Newcomer PA-C  CC:  three month f/u.... pt says her feet hurt... and Hypertension Management.  History of Present Illness: Here for f/u.  HTN:  Diovan increased last visit.  BP much better today.  DM:  A1C 7.3 last visit.  Never saw Susie Piper.  Trying to watch diet more closely.  She does not check blood sugars at home.  Does not have a machine.    Pain in feet:  Notes right heel pain.  Worse in the morning.  Feels better the more she does.  But, pain is constant.  Feels stiff.  Abdominal bruit:  No AAA on ultrasound.  Was to have renal arterial dopplers.  No results yet.  She has not done the study yet.  She shows me papers that she is supposed to get the ultrasound done at [facility] on July 18.    Screening colo:  She could not afford colo.  Will check on referral and ask to have with Dr. Doreatha Martin.  Hypertension History:      She denies headache, chest pain, and dyspnea with exertion.        Positive major cardiovascular risk factors include female age 65 years old or older, diabetes, hyperlipidemia, and hypertension.  Negative major cardiovascular risk factors include negative family history for ischemic heart disease and non-tobacco-user status.     Current Medications (verified): 1)  Norvasc 10 Mg Tabs  (Amlodipine Besylate) .... Take 1 Tablet By Mouth Once A Day 2)  Diovan Hct 320-25 Mg Tabs (Valsartan-Hydrochlorothiazide) .... Take 1 Tablet By Mouth Once A Day 3)  Aspir-Low 81 Mg Tbec (Aspirin) .... Take 1 Tablet By Mouth Once A Day With Food 4)  Crestor 40 Mg Tabs (Rosuvastatin Calcium) .... Take 1 Tablet By Mouth Once A Day 5)  Actos 30 Mg Tabs (Pioglitazone Hcl) .Marland Kitchen.. 1 By Mouth Once Daily 6)  Glucophage Xr 500 Mg  Tb24 (Metformin Hcl) .... 2 By Mouth Two Times A Day 7)  Fosamax 70 Mg  Tabs (Alendronate Sodium) .... Take One Pill Each Week For Bone Strength 8)  Levothroid 125 Mcg Tabs (Levothyroxine Sodium) .... Take 1 Tablet By Mouth Once A Day 9)  Naprosyn 500 Mg Tabs (Naproxen) .... Take 1 Tablet By Mouth Every 12 Hours As Needed Leg Pain 10)  Caltrate 600+d Plus 600-400 Mg-Unit Tabs (Calcium Carbonate-Vit D-Min) .... Take 1 Tablet By Mouth Two Times A Day  Allergies (verified): No Known Drug Allergies  Physical Exam  General:  alert, well-developed, and well-nourished.   Head:  normocephalic and atraumatic.   Neck:  supple and no carotid bruits.   Lungs:  normal breath sounds,  no crackles, and no wheezes.   Heart:  normal rate and regular rhythm.   Msk:  right foot: + pain with palpation over heel no deformity  Pulses:  R posterior tibial normal, R dorsalis pedis normal, L posterior tibial normal, and L dorsalis pedis normal.   Extremities:  no edema  Neurologic:  alert & oriented X3 and cranial nerves II-XII intact.   Psych:  normally interactive.     Impression & Recommendations:  Problem # 1:  DIABETES MELLITUS, TYPE II (ICD-250.00) add glipizide f/u in 3 mos  Her updated medication list for this problem includes:    Diovan Hct 320-25 Mg Tabs (Valsartan-hydrochlorothiazide) .Marland Kitchen... Take 1 tablet by mouth once a day    Aspir-low 81 Mg Tbec (Aspirin) .Marland Kitchen... Take 1 tablet by mouth once a day with food    Actos 30 Mg Tabs (Pioglitazone hcl) .Marland Kitchen... 1 by mouth once daily     Glucophage Xr 500 Mg Tb24 (Metformin hcl) .Marland Kitchen... 2 by mouth two times a day    Glucotrol Xl 5 Mg Xr24h-tab (Glipizide) .Marland Kitchen... Take 1 tablet by mouth once a day with a meal for diabetes.  please write in spanish.  Problem # 2:  PLANTAR FASCIITIS, RIGHT (ICD-728.71) naprosyn not helping will change to diclofenac and take two times a day for a week, then as needed ice massage stretches heel inserts if no improvement, send to podiatry  The following medications were removed from the medication list:    Naprosyn 500 Mg Tabs (Naproxen) .Marland Kitchen... Take 1 tablet by mouth every 12 hours as needed leg pain Her updated medication list for this problem includes:    Diclofenac Sodium 75 Mg Tbec (Diclofenac sodium) .Marland Kitchen... Take 1 tablet by mouth two times a day with food as needed for pain (please write in spanish)  Problem # 3:  ABDOMINAL BRUIT (ICD-785.9) renal dopplers sched 7/18 at [facility]  Problem # 4:  PREVENTIVE HEALTH CARE (ICD-V70.0) refer to Dr. Doreatha Martin for colo  Problem # 5:  HYPERTENSION (ICD-401.9) Assessment: Improved better controlled  Her updated medication list for this problem includes:    Norvasc 10 Mg Tabs (Amlodipine besylate) .Marland Kitchen... Take 1 tablet by mouth once a day    Diovan Hct 320-25 Mg Tabs (Valsartan-hydrochlorothiazide) .Marland Kitchen... Take 1 tablet by mouth once a day  Problem # 6:  HYPOTHYROIDISM (ICD-244.9)  Her updated medication list for this problem includes:    Levothroid 125 Mcg Tabs (Levothyroxine sodium) .Marland Kitchen... Take 1 tablet by mouth once a day  Orders: T-TSH ([address])  Problem # 7:  OSTEOPENIA (ICD-733.90) continue fosamax  Her updated medication list for this problem includes:    Fosamax 70 Mg Tabs (Alendronate sodium) .Marland Kitchen... Take one pill each week for bone strength    Caltrate 600+d Plus 600-400 Mg-unit Tabs (Calcium carbonate-vit d-min) .Marland Kitchen... Take 1 tablet by mouth two times a day  Problem # 8:  CEREBROVASCULAR DISEASE (ICD-437.9) cont ASA  Complete Medication  List: 1)  Norvasc 10 Mg Tabs (Amlodipine besylate) .... Take 1 tablet by mouth once a day 2)  Diovan Hct 320-25 Mg Tabs (Valsartan-hydrochlorothiazide) .... Take 1 tablet by mouth once a day 3)  Aspir-low 81 Mg Tbec (Aspirin) .... Take 1 tablet by mouth once a day with food 4)  Crestor 40 Mg Tabs (Rosuvastatin calcium) .... Take 1 tablet by mouth once a day 5)  Actos 30 Mg Tabs (Pioglitazone hcl) .Marland Kitchen.. 1 by mouth once daily 6)  Glucophage Xr 500 Mg Tb24 (Metformin hcl) .Marland KitchenMarland KitchenMarland Kitchen  2 by mouth two times a day 7)  Fosamax 70 Mg Tabs (Alendronate sodium) .... Take one pill each week for bone strength 8)  Levothroid 125 Mcg Tabs (Levothyroxine sodium) .... Take 1 tablet by mouth once a day 9)  Caltrate 600+d Plus 600-400 Mg-unit Tabs (Calcium carbonate-vit d-min) .... Take 1 tablet by mouth two times a day 10)  Glucose Meter  .... Check sugar once daily 11)  Glucose Meter Strips  .... Check sugar once daily 12)  Lancets  .... Check sugar once daily 13)  Glucotrol Xl 5 Mg Xr24h-tab (Glipizide) .... Take 1 tablet by mouth once a day with a meal for diabetes.  please write in spanish. 14)  Diclofenac Sodium 75 Mg Tbec (Diclofenac sodium) .... Take 1 tablet by mouth two times a day with food as needed for pain (please write in spanish)  Hypertension Assessment/Plan:      The patient's hypertensive risk group is category C: Target organ damage and/or diabetes.  Her calculated 10 year risk of coronary heart disease is 9 %.  Today's blood pressure is 126/76.  Her blood pressure goal is < 130/80.  Patient Instructions: 1)  Stop taking naprosyn. 2)  Take diclofenac two times a day with food for 7 days, then take two times a day with food as needed for pain. 3)  Rub ice on your heel two times a day for a week.  Do this for about 15 minute each time. 4)  Get tennis shoes or shoes with good cushioning in your heel.  You can also go to the drug store and get "Heel Inserts" to wear in your shoes for extra support. 5)   Do the stretches I gave you two times a day every day. 6)  If your pain does not get better over the next 6 weeks or gets worse, call. 7)  I am adding Glipizide to your medicines to help with your diabetes.  Take with a meal every day.  I have faxed the prescription to [facility].  You can pick up in 3-4 days. 8)  Please schedule a follow-up appointment in 3 months with Nyiah Pianka for diabetes and blood pressure. 9)  Make sure you go to the test on July 18. 10)  Someone will call you for an appointment with the doctor to do a colonoscopy. 11)    Prescriptions: DICLOFENAC SODIUM 75 MG TBEC (DICLOFENAC SODIUM) Take 1 tablet by mouth two times a day with food as needed for pain (please write in Spanish)  #30 x 3   Entered and Authorized by:   Tereso Newcomer PA-C   Signed by:   Tereso Newcomer PA-C on 07/03/2009   Method used:   Faxed to ...       [facility] (retail)       [address] [place], [place]  [address]       Ph: [phone]       Fax: [phone]   RxID:   [id number] GLUCOTROL XL 5 MG XR24H-TAB (GLIPIZIDE) Take 1 tablet by mouth once a day with a meal for diabetes.  Please write in Spanish.  #30 x 5   Entered and Authorized by:   Tereso Newcomer PA-C   Signed by:   Tereso Newcomer PA-C on 07/03/2009   Method used:   Faxed to ...       [facility] (retail)  [address] [place], [place]  [address]       Ph: [phone]       Fax: [phone]   RxID:   [id number] LANCETS check sugar once daily  #1 mo supply x 11   Entered and Authorized by:   Tereso Newcomer PA-C   Signed by:   Tereso Newcomer PA-C on 07/03/2009   Method used:   Print then Give to Patient   RxID:   [id number] GLUCOSE METER STRIPS check sugar once daily  #1 mo supply x 11   Entered and Authorized by:   Tereso Newcomer PA-C   Signed by:   Tereso Newcomer PA-C on 07/03/2009   Method used:   Print then Give to Patient    RxID:   [id number] GLUCOSE METER check sugar once daily  #1 x 0   Entered and Authorized by:   Tereso Newcomer PA-C   Signed by:   Tereso Newcomer PA-C on 07/03/2009   Method used:   Print then Give to Patient   RxID:   [id number]   Laboratory Results   Blood Tests     HGBA1C: 8.0%   (Normal Range: Non-Diabetic - 3-6%   Control Diabetic - 6-8%) CBG Random:: 114mg /dL

## 2010-02-13 NOTE — Procedures (Signed)
Summary: Colonoscopy  Patient: Linda House Note: All result statuses are Final unless otherwise noted.  Tests: (1) Colonoscopy (COL)   COL Colonoscopy           DONE     [facility]     [address]     [place], [place]  [address]           COLONOSCOPY PROCEDURE REPORT           PATIENT:  Linda, House  MR#:  #[id number]     BIRTHDATE:  08/03/1946, 63 yrs. old  GENDER:  female     ENDOSCOPIST:  Vania Rea. Jarold Motto, MD, [facility]     REF. BY:  Julieanne Manson, M.D.     PROCEDURE DATE:  08/22/2009     PROCEDURE:  Average-risk screening colonoscopy     G0121     ASA CLASS:  Class II     INDICATIONS:  Routine Risk Screening     MEDICATIONS:   Fentanyl 50 mcg IV, Versed 8 mg IV           DESCRIPTION OF PROCEDURE:   After the risks benefits and     alternatives of the procedure were thoroughly explained, informed     consent was obtained.  Digital rectal exam was performed and     revealed no abnormalities.   The LB [id number] endoscope     was introduced through the anus and advanced to the cecum, which     was identified by both the appendix and ileocecal valve, without     limitations.  The quality of the prep was excellent, using     MoviPrep.  The instrument was then slowly withdrawn as the colon     was fully examined.     <<PROCEDUREIMAGES>>           FINDINGS:  No polyps or cancers were seen.  This was otherwise a     normal examination of the colon.   Retroflexed views in the rectum     revealed hemorrhoids.  small internal hemorrhoid noted.  The scope     was then withdrawn from the patient and the procedure completed.           COMPLICATIONS:  None     ENDOSCOPIC IMPRESSION:     1) No polyps or cancers     2) Otherwise normal examination     3) Hemorrhoids     RECOMMENDATIONS:     1) Continue current colorectal screening recommendations for     "routine risk" patients with a repeat colonoscopy in 10 years.     REPEAT  EXAM:  No           ______________________________     Vania Rea. Jarold Motto, MD, [facility]           CC:  Tereso Newcomer PA           n.     eSIGNED:   Vania Rea. Patterson at 08/22/2009 08:57 AM           Timmie Foerster, #[id number]  Note: An exclamation mark (!) indicates a result that was not dispersed into the flowsheet. Document Creation Date: 08/22/2009 8:58 AM _______________________________________________________________________  (1) Order result status: Final Collection or observation date-time: 08/22/2009 08:51 Requested date-time:  Receipt date-time:  Reported date-time:  Referring Physician:   Ordering Physician: Sheryn Bison [phone]) Specimen Source:  Source: Launa Grill Order Number: [id number] Lab site:   Appended  Document: Colonoscopy    Clinical Lists Changes  Observations: Added new observation of COLONNXTDUE: 08/2019 (08/22/2009 11:12)      Appended Document: Colonoscopy    Clinical Lists Changes  Observations: Added new observation of PAST MED HX: Current Problems:  MEDIAL EPICONDYLITIS, LEFT (ICD-726.31) DIABETIC PERIPHERAL NEUROPATHY (ICD-250.60) CEREBROVASCULAR DISEASE (ICD-437.9) TRANSIENT ISCHEMIC ATTACK, HX OF (ICD-V12.50) HYPOTHYROIDISM (ICD-244.9) HYPERTENSION (ICD-401.9) HYPERLIPIDEMIA (ICD-272.4) DIABETES MELLITUS, TYPE II (ICD-250.00) EKG done 09/12/05 Renal duplex 07/2009:  no RA stenosis; bilat kidney lengths normal; no celiac art stenosis but increased velocities noted in prox and mid SMA Colo 08/2009: hemorrhoids; no polyps; repeat 2021 (08/24/2009 17:26)        Past History:  Past Medical History: Current Problems:  MEDIAL EPICONDYLITIS, LEFT (ICD-726.31) DIABETIC PERIPHERAL NEUROPATHY (ICD-250.60) CEREBROVASCULAR DISEASE (ICD-437.9) TRANSIENT ISCHEMIC ATTACK, HX OF (ICD-V12.50) HYPOTHYROIDISM (ICD-244.9) HYPERTENSION (ICD-401.9) HYPERLIPIDEMIA (ICD-272.4) DIABETES MELLITUS, TYPE II (ICD-250.00) EKG  done 09/12/05 Renal duplex 07/2009:  no RA stenosis; bilat kidney lengths normal; no celiac art stenosis but increased velocities noted in prox and mid SMA Colo 08/2009: hemorrhoids; no polyps; repeat 2021

## 2010-02-13 NOTE — Letter (Signed)
Summary: *HSN Results Follow up  [facility]  [address] [place], [place] [address]   Phone: [phone]  Fax: [phone]      03/30/2009   [facility] [address] [place], [place]  [address]   Dear  Ms. Lennon Alstrom,                            ____S.Drinkard,FNP   ____D. Gore,FNP       ____B. McPherson,MD   ____V. Rankins,MD    ____E. Mulberry,MD    ____N. Daphine Deutscher, FNP  ____D. Reche Dixon, MD    ____K. Philipp Deputy, MD    __x__S. Alben Spittle, PA-C     This letter is to inform you that your recent test(s):  ___x____Pap Smear    _______Lab Test     _______X-ray    ___x____ is within acceptable limits  _______ requires a medication change  _______ requires a follow-up lab visit  _______ requires a follow-up visit with your provider   Comments:       _________________________________________________________ If you have any questions, please contact our office                     Sincerely,  Tereso Newcomer PA-C [facility]

## 2010-02-13 NOTE — Letter (Signed)
Summary: CONSULTATION REQUEST VASCULAR LAB  CONSULTATION REQUEST VASCULAR LAB   Imported By: Arta Bruce 05/04/2009 09:30:08  _____________________________________________________________________  External Attachment:    Type:   Image     Comment:   External Document

## 2010-02-13 NOTE — Letter (Signed)
Summary: *HSN Results Follow up  [facility]  [address] [place], [place] [address]   Phone: [phone]  Fax: [phone]      06/05/2009   [facility] [address] [place], [place]  [address]   Dear  Ms. Lennon Alstrom,                            ____S.Drinkard,FNP   ____D. Gore,FNP       ____B. McPherson,MD   ____V. Rankins,MD    ____E. Mulberry,MD    ____N. Daphine Deutscher, FNP  ____D. Reche Dixon, MD    ____K. Philipp Deputy, MD    __x__S. Alben Spittle, PA-C     This letter is to inform you that your recent test(s):  _______Pap Smear    _______Lab Test     _______X-ray    _______ is within acceptable limits  _______ requires a medication change  _______ requires a follow-up lab visit  _______ requires a follow-up visit with your provider   Comments: Your eye test in April was normal.       _________________________________________________________ If you have any questions, please contact our office                     Sincerely,  Tereso Newcomer PA-C [facility]

## 2010-02-13 NOTE — Letter (Signed)
Summary: Previsit letter  [facility]  [address] [place], [place] [address]   Phone: [phone]  Fax: [phone]       08/03/2009 MRN: [id number]  [facility] [address] [place], [place]  [address]  Dear Linda House,  Welcome to the Gastroenterology Division at [facility].    You are scheduled to see a nurse for your pre-procedure visit on 08/14/2009 at 1:00PM on the 3rd floor at [facility], [address]  We ask that you try to arrive at our office 15 minutes prior to your appointment time to allow for check-in.  Your nurse visit will consist of discussing your medical and surgical history, your immediate family medical history, and your medications.    Please bring a complete list of all your medications or, if you prefer, bring the medication bottles and we will list them.  We will need to be aware of both prescribed and over the counter drugs.  We will need to know exact dosage information as well.  If you are on blood thinners (Coumadin, Plavix, Aggrenox, Ticlid, etc.) please call our office today/prior to your appointment, as we need to consult with your physician about holding your medication.   Please be prepared to read and sign documents such as consent forms, a financial agreement, and acknowledgement forms.  If necessary, and with your consent, a friend or relative is welcome to sit-in on the nurse visit with you.  Please bring your insurance card so that we may make a copy of it.  If your insurance requires a referral to see a specialist, please bring your referral form from your primary care physician.  No co-pay is required for this nurse visit.     If you cannot keep your appointment, please call [phone] to cancel or reschedule prior to your appointment date.  This allows [place] the opportunity to schedule an appointment for another patient in need of care.    Thank you for choosing [facility] for  your medical needs.  We appreciate the opportunity to care for you.  Please visit [place] at our website  to learn more about our practice.                     Sincerely.                                                                                                                   The Gastroenterology Division

## 2010-02-13 NOTE — Miscellaneous (Signed)
Summary: Bone Density 2011  Clinical Lists Changes  Problems: Assessed OSTEOPENIA as comment only -  Her updated medication list for this problem includes:    Fosamax 70 Mg Tabs (Alendronate sodium) .Marland Kitchen... Take one pill each week for bone strength    Caltrate 600+d Plus 600-400 Mg-unit Tabs (Calcium carbonate-vit d-min) .Marland Kitchen... Take 1 tablet by mouth two times a day  - Signed Medications: Added new medication of CALTRATE 600+D PLUS 600-400 MG-UNIT TABS (CALCIUM CARBONATE-VIT D-MIN) Take 1 tablet by mouth two times a day - Signed Rx of CALTRATE 600+D PLUS 600-400 MG-UNIT TABS (CALCIUM CARBONATE-VIT D-MIN) Take 1 tablet by mouth two times a day;  #60 x 11;  Signed;  Entered by: Tereso Newcomer PA-C;  Authorized by: Tereso Newcomer PA-C;  Method used: Faxed to [facility], [address], [place], [place]  [address], Ph: [phone], Fax: [phone] Observations: Added new observation of BONE DENSITY:  Hip Total: T Score -2.5 to -1.0 Hip.     T score of right and left Femur is -1.5 (04/18/2009 22:03)   Notify patient that her bone density test is stable. She should stay on fosamax. She should also be taking calcium + vitamin D every day (Caltrate + D 600 mg + 400 International Units two times a day )   pt would like a rx sent to [facility]... [place] Shannon  May 09, 2009 10:23 AM  Prescriptions: CALTRATE 600+D PLUS 600-400 MG-UNIT TABS (CALCIUM CARBONATE-VIT D-MIN) Take 1 tablet by mouth two times a day  #60 x 11   Entered and Authorized by:   Tereso Newcomer PA-C   Signed by:   Tereso Newcomer PA-C on 05/10/2009   Method used:   Faxed to ...       [facility] (retail)       [address] [place], [place]  [address]       Ph: [phone]       Fax: [phone]   RxID:   [id number]    Bone Density  Procedure date:  04/18/2009  Findings:       Hip Total: T Score -2.5 to -1.0 Hip.     T score of  right and left Femur is -1.5  Comments:       Assessment:  Osteopenia.      Impression & Recommendations:  Problem # 1:  OSTEOPENIA (ICD-733.90)  Her updated medication list for this problem includes:    Fosamax 70 Mg Tabs (Alendronate sodium) .Marland Kitchen... Take one pill each week for bone strength    Caltrate 600+d Plus 600-400 Mg-unit Tabs (Calcium carbonate-vit d-min) .Marland Kitchen... Take 1 tablet by mouth two times a day  Complete Medication List: 1)  Norvasc 10 Mg Tabs (Amlodipine besylate) .... Take 1 tablet by mouth once a day 2)  Diovan Hct 320-25 Mg Tabs (Valsartan-hydrochlorothiazide) .... Take 1 tablet by mouth once a day 3)  Aspir-low 81 Mg Tbec (Aspirin) .... Take 1 tablet by mouth once a day with food 4)  Crestor 40 Mg Tabs (Rosuvastatin calcium) .... Take 1 tablet by mouth once a day 5)  Actos 30 Mg Tabs (Pioglitazone hcl) .Marland Kitchen.. 1 by mouth once daily 6)  Glucophage Xr 500 Mg Tb24 (Metformin hcl) .... 2 by mouth two times a day 7)  Fosamax 70 Mg Tabs (Alendronate sodium) .... Take one pill each week for bone strength 8)  Levothroid 125 Mcg Tabs (Levothyroxine sodium) .Marland KitchenMarland KitchenMarland Kitchen  Take 1 tablet by mouth once a day 9)  Naprosyn 500 Mg Tabs (Naproxen) .... Take 1 tablet by mouth every 12 hours as needed leg pain 10)  Caltrate 600+d Plus 600-400 Mg-unit Tabs (Calcium carbonate-vit d-min) .... Take 1 tablet by mouth two times a day

## 2010-02-13 NOTE — Assessment & Plan Note (Signed)
Summary: FU VISIT WITH WEAVER PA IN 3 MONTHS FOR DM AND BP CHECK//GK   Vital Signs:  Patient profile:   65 year old female Menstrual status:  postmenopausal Height:      58 inches Weight:      157.9 pounds BMI:     33.12 Temp:     97.7 degrees F oral Pulse rate:   76 / minute Pulse rhythm:   regular Resp:     16 per minute BP sitting:   150 / 84  (left arm) Cuff size:   regular  Vitals Entered By: [place] Shannon (October 16, 2009 9:57 AM) CC: f/u.... Is Patient Diabetic? No Pain Assessment Patient in pain? no       Does patient need assistance? Functional Status Self care Ambulation Normal   Primary Care Provider:  Tereso Newcomer PA-C  CC:  f/u.....  History of Present Illness: Here for f/u.  DM:  Thinks sugars are doing ok.  Card expired.  Could not get medicines.  To pick up this week.  Has not takend anything for a month.  Sugars range 110-120s.  Sugars were ok while taking meds.  No hypoglycemic episodes.    HTN:  Again, no meds in a month.  Felt pretty good when she was taking meds.  Feels a little dizzy now that she is not taking.  No sycope.  No chest pain or dyspnea.  No headaches.   Right plantar fasciitis:  Has been seen for this before.  Gave her stretches, diclofenac.  She has heel inserts.  Not getting better.  Having significant pain.  On her feet all day at work.  Gingival irritation:  Had dental work done recently.  Startedd after this.  To see dentist in 2 days.  Painful.  No fever.  Problems Prior to Update: 1)  Epidermoid Cyst, Infected  (ICD-706.2) 2)  Plantar Fasciitis, Right  (ICD-728.71) 3)  Abdominal Bruit  (ICD-785.9) 4)  Family History Diabetes 1st Degree Relative  (ICD-V18.0) 5)  Urinalysis, Abnormal  (ICD-791.9) 6)  Microalbuminuria  (ICD-791.0) 7)  Varicose Veins, Lower Extremities  (ICD-454.9) 8)  Abscess, Skin  (ICD-682.9) 9)  Preventive Health Care  (ICD-V70.0) 10)  Muscle Strain, Hamstring Muscle  (ICD-844.8) 11)  Screening For  Mlig Neop, Breast, Nos  (ICD-V76.10) 12)  Osteopenia  (ICD-733.90) 13)  Cystitis  (ICD-595.9) 14)  Hematuria Unspecified  (ICD-599.70) 15)  Medial Epicondylitis, Left  (ICD-726.31) 16)  Diabetic Peripheral Neuropathy  (ICD-250.60) 17)  Cerebrovascular Disease  (ICD-437.9) 18)  Transient Ischemic Attack, Hx of  (ICD-V12.50) 19)  Hypothyroidism  (ICD-244.9) 20)  Hypertension  (ICD-401.9) 21)  Hyperlipidemia  (ICD-272.4) 22)  Diabetes Mellitus, Type II  (ICD-250.00)  Current Medications (verified): 1)  Norvasc 10 Mg Tabs (Amlodipine Besylate) .... Take 1 Tablet By Mouth Once A Day 2)  Diovan Hct 320-25 Mg Tabs (Valsartan-Hydrochlorothiazide) .... Take 1 Tablet By Mouth Once A Day 3)  Aspir-Low 81 Mg Tbec (Aspirin) .... Take 1 Tablet By Mouth Once A Day With Food 4)  Crestor 40 Mg Tabs (Rosuvastatin Calcium) .... Take 1 Tablet By Mouth Once A Day 5)  Actos 30 Mg Tabs (Pioglitazone Hcl) .Marland Kitchen.. 1 By Mouth Once Daily 6)  Glucophage Xr 500 Mg  Tb24 (Metformin Hcl) .... 2 By Mouth Two Times A Day 7)  Fosamax 70 Mg  Tabs (Alendronate Sodium) .... Take One Pill Each Week For Bone Strength 8)  Levothroid 125 Mcg Tabs (Levothyroxine Sodium) .... Take 1 Tablet By Mouth  Once A Day 9)  Caltrate 600+d Plus 600-400 Mg-Unit Tabs (Calcium Carbonate-Vit D-Min) .... Take 1 Tablet By Mouth Two Times A Day 10)  Glucose Meter .... Check Sugar Once Daily 11)  Glucose Meter Strips .... Check Sugar Once Daily 12)  Lancets .... Check Sugar Once Daily 13)  Glucotrol Xl 5 Mg Xr24h-Tab (Glipizide) .... Take 1 Tablet By Mouth Once A Day With A Meal For Diabetes.  Please Write in Spanish. 14)  Diclofenac Sodium 75 Mg Tbec (Diclofenac Sodium) .... Take 1 Tablet By Mouth Two Times A Day With Food As Needed For Pain (Please Write in Spanish) 15)  Doxycycline Hyclate 100 Mg Tabs (Doxycycline Hyclate) .Marland Kitchen.. 1 Tab By Mouth Two Times A Day For 10 Days  Allergies (verified): No Known Drug Allergies  Past History:  Past  Medical History: Last updated: 08/24/2009 Current Problems:  MEDIAL EPICONDYLITIS, LEFT (ICD-726.31) DIABETIC PERIPHERAL NEUROPATHY (ICD-250.60) CEREBROVASCULAR DISEASE (ICD-437.9) TRANSIENT ISCHEMIC ATTACK, HX OF (ICD-V12.50) HYPOTHYROIDISM (ICD-244.9) HYPERTENSION (ICD-401.9) HYPERLIPIDEMIA (ICD-272.4) DIABETES MELLITUS, TYPE II (ICD-250.00) EKG done 09/12/05 Renal duplex 07/2009:  no RA stenosis; bilat kidney lengths normal; no celiac art stenosis but increased velocities noted in prox and mid SMA Colo 08/2009: hemorrhoids; no polyps; repeat 2021  Past Surgical History: Last updated: 07/23/2006 Tubal ligation 1982  Physical Exam  General:  alert and well-developed.   Head:  normocephalic and atraumatic.   Mouth:  poor dentition and gingival inflammation.   Neck:  no cervical lymphadenopathy.   Lungs:  normal breath sounds.   Heart:  normal rate and regular rhythm.   Abdomen:  soft and non-tender.   Msk:  Right heel:  + pain with palp  Neurologic:  alert & oriented X3 and cranial nerves II-XII intact.   Psych:  normally interactive.    Diabetes Management Exam:    Foot Exam (with socks and/or shoes not present):       Inspection:          Left foot: normal          Right foot: normal       Nails:          Left foot: normal          Right foot: normal   Impression & Recommendations:  Problem # 1:  PLANTAR FASCIITIS, RIGHT (ICD-728.71)  refer to foot clinic at [facility]   Her updated medication list for this problem includes:    Diclofenac Sodium 75 Mg Tbec (Diclofenac sodium) .Marland Kitchen... Take 1 tablet by mouth two times a day with food as needed for pain (please write in spanish)  Orders: Podiatry Referral (Podiatry)  Problem # 2:  DIABETES MELLITUS, TYPE II (ICD-250.00) refill meds check labs  Her updated medication list for this problem includes:    Diovan Hct 320-25 Mg Tabs (Valsartan-hydrochlorothiazide) .Marland Kitchen... Take 1 tablet by mouth once a day    Aspir-low 81 Mg Tbec  (Aspirin) .Marland Kitchen... Take 1 tablet by mouth once a day with food    Actos 30 Mg Tabs (Pioglitazone hcl) .Marland Kitchen... 1 by mouth once daily    Glucophage Xr 500 Mg Tb24 (Metformin hcl) .Marland Kitchen... 2 by mouth two times a day    Glucotrol Xl 5 Mg Xr24h-tab (Glipizide) .Marland Kitchen... Take 1 tablet by mouth once a day with a meal for diabetes.  please write in spanish.  Orders: Capillary Blood Glucose/CBG (82948) T- Hemoglobin A1C ([address])  Problem # 3:  HYPERTENSION (ICD-401.9) refill meds  Her updated medication list for this problem includes:  Norvasc 10 Mg Tabs (Amlodipine besylate) .Marland Kitchen... Take 1 tablet by mouth once a day    Diovan Hct 320-25 Mg Tabs (Valsartan-hydrochlorothiazide) .Marland Kitchen... Take 1 tablet by mouth once a day  Problem # 4:  HYPOTHYROIDISM (ICD-244.9) refill meds check labs  Her updated medication list for this problem includes:    Levothroid 125 Mcg Tabs (Levothyroxine sodium) .Marland Kitchen... Take 1 tablet by mouth once a day  Orders: T-TSH (14782-95621) T-T4, Free [id number])  Problem # 5:  ACUTE GINGIVITIS PLAQUE INDUCED (ICD-523.00)  will give chlorhexidine rinse f/u with dentist this week check tsh and check cbc to r/o vit def.  Orders: T-TSH (62952-84132) T-CBC w/Diff [phone])  Complete Medication List: 1)  Norvasc 10 Mg Tabs (Amlodipine besylate) .... Take 1 tablet by mouth once a day 2)  Diovan Hct 320-25 Mg Tabs (Valsartan-hydrochlorothiazide) .... Take 1 tablet by mouth once a day 3)  Aspir-low 81 Mg Tbec (Aspirin) .... Take 1 tablet by mouth once a day with food 4)  Crestor 40 Mg Tabs (Rosuvastatin calcium) .... Take 1 tablet by mouth once a day 5)  Actos 30 Mg Tabs (Pioglitazone hcl) .Marland Kitchen.. 1 by mouth once daily 6)  Glucophage Xr 500 Mg Tb24 (Metformin hcl) .... 2 by mouth two times a day 7)  Fosamax 70 Mg Tabs (Alendronate sodium) .... Take one pill each week for bone strength 8)  Levothroid 125 Mcg Tabs (Levothyroxine sodium) .... Take 1 tablet by mouth once a day 9)   Caltrate 600+d Plus 600-400 Mg-unit Tabs (Calcium carbonate-vit d-min) .... Take 1 tablet by mouth two times a day 10)  Glucose Meter  .... Check sugar once daily 11)  Glucose Meter Strips  .... Check sugar once daily 12)  Lancets  .... Check sugar once daily 13)  Glucotrol Xl 5 Mg Xr24h-tab (Glipizide) .... Take 1 tablet by mouth once a day with a meal for diabetes.  please write in spanish. 14)  Diclofenac Sodium 75 Mg Tbec (Diclofenac sodium) .... Take 1 tablet by mouth two times a day with food as needed for pain (please write in spanish) 15)  Peridex 0.12 % Soln (Chlorhexidine gluconate) .... Swish and spit two times a day for one week  Patient Instructions: 1)  Schedule appt with foot clinic at [facility] for plantar fasciitis. 2)  Follow up with your dentist for your gums. 3)  Use the mouth rinse for one week. 4)  Get your medicines today and start taking again. 5)  Schedule follow up appt in 3 months for diabetes and blood pressure. 6)  If gums are getting worse, come back sooner. Prescriptions: PERIDEX 0.12 % SOLN (CHLORHEXIDINE GLUCONATE) swish and spit two times a day for one week  #1 bottle x 0   Entered and Authorized by:   Tereso Newcomer PA-C   Signed by:   Tereso Newcomer PA-C on 10/17/2009   Method used:   Faxed to ...       [facility] (retail)       [address] [place], [place]  [address]       Ph: [phone]       Fax: [phone]   RxID:   [id number]

## 2010-02-13 NOTE — Letter (Signed)
Summary: [facility] Instructions  [facility]  [address] [place], [place] [address]   Phone: [phone]  Fax: [phone]       KEISHLA OYER    08/03/1946    MRN: [id number]        Procedure Day Dorna Bloom:  Jake Shark  08/22/09     Arrival Time:  7:30AM     Procedure Time:  8:30AM     Location of Procedure:                    Juliann Pares _  [facility] (4th Floor)   PREPARATION FOR COLONOSCOPY WITH MOVIPREP   Starting 5 days prior to your procedure 08/17/09 do not eat nuts, seeds, popcorn, corn, beans, peas,  salads, or any raw vegetables.  Do not take any fiber supplements (e.g. Metamucil, Citrucel, and Benefiber).  THE DAY BEFORE YOUR PROCEDURE         DATE: 08/21/09  DAY: MONDAY  1.  Drink clear liquids the entire day-NO SOLID FOOD  2.  Do not drink anything colored red or purple.  Avoid juices with pulp.  No orange juice.  3.  Drink at least 64 oz. (8 glasses) of fluid/clear liquids during the day to prevent dehydration and help the prep work efficiently.  CLEAR LIQUIDS INCLUDE: Water Jello Ice Popsicles Tea (sugar ok, no milk/cream) Powdered fruit flavored drinks Coffee (sugar ok, no milk/cream) Gatorade Juice: apple, white grape, white cranberry  Lemonade Clear bullion, consomm, broth Carbonated beverages (any kind) Strained chicken noodle soup Hard Candy                             4.  In the morning, mix first dose of MoviPrep solution:    Empty 1 Pouch A and 1 Pouch B into the disposable container    Add lukewarm drinking water to the top line of the container. Mix to dissolve    Refrigerate (mixed solution should be used within 24 hrs)  5.  Begin drinking the prep at 5:00 p.m. The MoviPrep container is divided by 4 marks.   Every 15 minutes drink the solution down to the next mark (approximately 8 oz) until the full liter is complete.   6.  Follow completed prep with 16 oz of clear liquid of your choice (Nothing red or purple).   Continue to drink clear liquids until bedtime.  7.  Before going to bed, mix second dose of MoviPrep solution:    Empty 1 Pouch A and 1 Pouch B into the disposable container    Add lukewarm drinking water to the top line of the container. Mix to dissolve    Refrigerate  THE DAY OF YOUR PROCEDURE      DATE: 08/22/09  DAY: TUESDAY  Beginning at 3:30AM (5 hours before procedure):         1. Every 15 minutes, drink the solution down to the next mark (approx 8 oz) until the full liter is complete.  2. Follow completed prep with 16 oz. of clear liquid of your choice.    3. You may drink clear liquids until 6:30AM (2 HOURS BEFORE PROCEDURE).   MEDICATION INSTRUCTIONS  Unless otherwise instructed, you should take regular prescription medications with a small sip of water   as early as possible the morning of your procedure.  Diabetic patients - see separate instructions.    Additional medication instructions:n/a  OTHER INSTRUCTIONS  You will need a responsible adult at least 64 years of age to accompany you and drive you home.   This person must remain in the waiting room during your procedure.  Wear loose fitting clothing that is easily removed.  Leave jewelry and other valuables at home.  However, you may wish to bring a book to read or  an iPod/MP3 player to listen to music as you wait for your procedure to start.  Remove all body piercing jewelry and leave at home.  Total time from sign-in until discharge is approximately 2-3 hours.  You should go home directly after your procedure and rest.  You can resume normal activities the  day after your procedure.  The day of your procedure you should not:   Drive   Make legal decisions   Operate machinery   Drink alcohol   Return to work  You will receive specific instructions about eating, activities and medications before you leave.    The above instructions have been reviewed and explained to me by    Sherren Kerns RN  August 14, 2009 1:37 PM     I fully understand and can verbalize these instructions _____________________________ Date _________

## 2010-02-13 NOTE — Letter (Signed)
Summary: RENAL ARTERY DUPLEX EVAL  RENAL ARTERY DUPLEX EVAL   Imported By: Arta Bruce 08/30/2009 11:48:12  _____________________________________________________________________  External Attachment:    Type:   Image     Comment:   External Document

## 2010-02-13 NOTE — Assessment & Plan Note (Signed)
Summary: *Has infected rash on back // tl  Nurse Visit   Vital Signs:  Patient profile:   65 year old female Menstrual status:  postmenopausal Temp:     98.2 degrees F  Vitals Entered By: Gaylyn Cheers RN (July 28, 2009 11:40 AM)  Patient Instructions: 1)  follow up with Tereso Newcomer, PA-C Beginning of next week.   Review of Systems Derm:  Complains of changes in color of skin, itching, and lesion(s).   Primary Care Provider:  Tereso Newcomer PA-C  CC:  sore on back.  History of Present Illness: mid-back reddened raised pustule, very tender to touch. First noticed yesterday. She also has 2 pimples with black centers on her lower back.   Physical Exam  Skin:  3 cm area about epidermoid cyst, mid thoracic back with erythema --has come to a pustular head. Area cleaned in sterile fashion.  2 cc of Lidocaine with Epi injected subcutaneously about pustular head. 11 blade used to open area of about 2 cm with scant thin pustular drainage.  Hemostat opened in wound to break up adhesions and expressed 3 cc or more of cheesy white sebaceous material with bad odor.  Pt. tolerated well. Dressing placed   Impression & Recommendations:  Problem # 1:  EPIDERMOID CYST, INFECTED (ICD-706.2)  To be seen immediately if develops increased pain, redness, discharge, fever. Change dressing daily Start Doxycycline  Orders: I&D Abscess, Simple / Single (10060)  Complete Medication List: 1)  Norvasc 10 Mg Tabs (Amlodipine besylate) .... Take 1 tablet by mouth once a day 2)  Diovan Hct 320-25 Mg Tabs (Valsartan-hydrochlorothiazide) .... Take 1 tablet by mouth once a day 3)  Aspir-low 81 Mg Tbec (Aspirin) .... Take 1 tablet by mouth once a day with food 4)  Crestor 40 Mg Tabs (Rosuvastatin calcium) .... Take 1 tablet by mouth once a day 5)  Actos 30 Mg Tabs (Pioglitazone hcl) .Marland Kitchen.. 1 by mouth once daily 6)  Glucophage Xr 500 Mg Tb24 (Metformin hcl) .... 2 by mouth two times a day 7)  Fosamax  70 Mg Tabs (Alendronate sodium) .... Take one pill each week for bone strength 8)  Levothroid 125 Mcg Tabs (Levothyroxine sodium) .... Take 1 tablet by mouth once a day 9)  Caltrate 600+d Plus 600-400 Mg-unit Tabs (Calcium carbonate-vit d-min) .... Take 1 tablet by mouth two times a day 10)  Glucose Meter  .... Check sugar once daily 11)  Glucose Meter Strips  .... Check sugar once daily 12)  Lancets  .... Check sugar once daily 13)  Glucotrol Xl 5 Mg Xr24h-tab (Glipizide) .... Take 1 tablet by mouth once a day with a meal for diabetes.  please write in spanish. 14)  Diclofenac Sodium 75 Mg Tbec (Diclofenac sodium) .... Take 1 tablet by mouth two times a day with food as needed for pain (please write in spanish) 15)  Doxycycline Hyclate 100 Mg Tabs (Doxycycline hyclate) .Marland Kitchen.. 1 tab by mouth two times a day for 10 days  Other Orders: Capillary Blood Glucose/CBG (04540)  CC: sore on back Is Patient Diabetic? Yes CBG Result 115 CBG Device ID [id number]   Allergies (verified): No Known Drug Allergies Laboratory Results   Blood Tests     CBG Random:: 115mg /dL     Orders Added: 1)  Capillary Blood Glucose/CBG [82948] 2)  I&D Abscess, Simple / Single [10060] Prescriptions: DOXYCYCLINE HYCLATE 100 MG TABS (DOXYCYCLINE HYCLATE) 1 tab by mouth two times a day for 10 days  #20 x  0   Entered by:   Julieanne Manson MD   Authorized by:   Tereso Newcomer PA-C   Signed by:   Julieanne Manson MD on 07/28/2009   Method used:   Faxed to ...       [facility] (retail)       [address] [place], [place]  [address]       Ph: [phone]       Fax: [phone]   RxID:   [id number]

## 2010-02-13 NOTE — Letter (Signed)
Summary: *HSN Results Follow up  [facility]  [address] [place], [place] [address]   Phone: [phone]  Fax: [phone]      05/02/2009   Taleyah RODRIGUEZ [address] [place], [place]  [address]   Dear  Ms. Lennon Alstrom,                            ____S.Drinkard,FNP   ____D. Gore,FNP       ____B. McPherson,MD   ____V. Rankins,MD    ____E. Mulberry,MD    ____N. Daphine Deutscher, FNP  ____D. Reche Dixon, MD    ____K. Philipp Deputy, MD    __x__S. Alben Spittle, PA-C     This letter is to inform you that your recent test(s):  _______Pap Smear    ____x___Lab Test     _______X-ray    ___x____ is within acceptable limits  _______ requires a medication change  _______ requires a follow-up lab visit  _______ requires a follow-up visit with your provider   Comments:       _________________________________________________________ If you have any questions, please contact our office                     Sincerely,  Tereso Newcomer PA-C [facility]

## 2010-02-13 NOTE — Progress Notes (Signed)
Summary: GI referral  Phone Note Outgoing Call   Summary of Call: Needs screening colo.  Schedule with Dr. Victorino Dike. Initial call taken by: Tereso Newcomer PA-C,  July 03, 2009 10:35 AM

## 2010-02-13 NOTE — Letter (Signed)
Summary: NUTRITIONIST SUMMARY/SUSIE  NUTRITIONIST SUMMARY/SUSIE   Imported By: Arta Bruce 04/12/2009 10:51:14  _____________________________________________________________________  External Attachment:    Type:   Image     Comment:   External Document

## 2010-02-13 NOTE — Letter (Signed)
Summary: RETAUSRE  RETAUSRE   Imported By: Arta Bruce 06/06/2009 10:16:23  _____________________________________________________________________  External Attachment:    Type:   Image     Comment:   External Document

## 2010-02-13 NOTE — Assessment & Plan Note (Signed)
Summary: LEFT ARM PROBLEM/ DM PT//GK   Vital Signs:  Patient profile:   65 year old female Menstrual status:  postmenopausal Weight:      165 pounds Temp:     98.2 degrees F Pulse rate:   76 / minute Pulse rhythm:   regular Resp:     18 per minute BP sitting:   151 / 76  (left arm) Cuff size:   regular  Vitals Entered By: Vesta Mixer CMA (March 20, 2009 11:27 AM)  Serial Vital Signs/Assessments:  Time      Position  BP       Pulse  Resp  Temp     By 1:05 PM             190/80                         Tereso Newcomer PA-C  CC: abcess left deltoid area x 1 1/2 weeks, Hypertension Management Is Patient Diabetic? Yes Pain Assessment Patient in pain? no      CBG Result 179  Does patient need assistance? Ambulation Normal   CC:  abcess left deltoid area x 1 1/2 weeks and Hypertension Management.  History of Present Illness: Here for abscess left deltoid. Went to ED last Friday. Had I&D and was placed on doxycycline. Feels much better.  Denies fevers or chills.  Denies nausea or vomiting. Was told to f/u today for recheck.  DM:  A1C up some.  Has slacked off on activity.  Diet also poor last several mos.  Needs retasure and microalbumin.  Has not check sugar at home.  Ran out of strips.  HTN:  Takes meds at night.  No meds yet today.    Health maint:  Due for mammo and pap.  CPP scheduled next week.   Hypertension History:      She denies headache, chest pain, palpitations, dyspnea with exertion, and syncope.  Further comments include: did not take meds this morning .        Positive major cardiovascular risk factors include female age 7 years old or older, diabetes, hyperlipidemia, and hypertension.  Negative major cardiovascular risk factors include negative family history for ischemic heart disease and non-tobacco-user status.    Problems Prior to Update: 1)  Abscess, Skin  (ICD-682.9) 2)  Preventive Health Care  (ICD-V70.0) 3)  Muscle Strain, Hamstring Muscle   (ICD-844.8) 4)  Screening For Mlig Neop, Breast, Nos  (ICD-V76.10) 5)  Osteopenia  (ICD-733.90) 6)  Cystitis  (ICD-595.9) 7)  Hematuria Unspecified  (ICD-599.70) 8)  Medial Epicondylitis, Left  (ICD-726.31) 9)  Diabetic Peripheral Neuropathy  (ICD-250.60) 10)  Cerebrovascular Disease  (ICD-437.9) 11)  Transient Ischemic Attack, Hx of  (ICD-V12.50) 12)  Hypothyroidism  (ICD-244.9) 13)  Hypertension  (ICD-401.9) 14)  Hyperlipidemia  (ICD-272.4) 15)  Diabetes Mellitus, Type II  (ICD-250.00)  Allergies: No Known Drug Allergies  Past History:  Past Medical History: Last updated: 11/30/2007 Current Problems:  MEDIAL EPICONDYLITIS, LEFT (ICD-726.31) DIABETIC PERIPHERAL NEUROPATHY (ICD-250.60) CEREBROVASCULAR DISEASE (ICD-437.9) TRANSIENT ISCHEMIC ATTACK, HX OF (ICD-V12.50) HYPOTHYROIDISM (ICD-244.9) HYPERTENSION (ICD-401.9) HYPERLIPIDEMIA (ICD-272.4) DIABETES MELLITUS, TYPE II (ICD-250.00) EKG done 09/12/05  Past Surgical History: Last updated: 07/23/2006 Tubal ligation 1982  Physical Exam  General:  alert, well-developed, and well-nourished.   Head:  normocephalic and atraumatic.   Neck:  supple.   Lungs:  normal breath sounds, no crackles, and no wheezes.   Heart:  normal rate and regular rhythm.  Abdomen:  soft.   Neurologic:  alert & oriented X3 and cranial nerves II-XII intact.   Skin:  3.5 cm area of induration over left deltoid lanced area in center with purulent discharge area of erythema confined to immediate vicinity mild pain with palpation  Psych:  normally interactive.    Diabetes Management Exam:    Foot Exam (with socks and/or shoes not present):       Sensory-Monofilament:          Left foot: normal          Right foot: normal   Impression & Recommendations:  Problem # 1:  PREVENTIVE HEALTH CARE (ICD-V70.0)  has CPP scheduled next week overdue for pap schedule mammo  Orders: Mammogram (Screening) (Mammo)  Problem # 2:  DIABETES MELLITUS,  TYPE II (ICD-250.00) A1C above 7 previously under 7 not as active increase activity refresher with dietician if remains above 7, add glipizide  Her updated medication list for this problem includes:    Diovan Hct 320-12.5 Mg Tabs (Valsartan-hydrochlorothiazide) .Marland Kitchen... 1 by mouth q am    Aspir-low 81 Mg Tbec (Aspirin) .Marland Kitchen... Take 1 tablet by mouth once a day with food    Actos 30 Mg Tabs (Pioglitazone hcl) .Marland Kitchen... 1 by mouth once daily    Glucophage Xr 500 Mg Tb24 (Metformin hcl) .Marland Kitchen... 2 by mouth two times a day  Orders: Capillary Blood Glucose/CBG (16109) T-Urine Microalbumin w/creat. ratio [phone]) Diabetic Clinic Referral (Diabetic)  Problem # 3:  HYPERLIPIDEMIA (ICD-272.4) not fasting today  Her updated medication list for this problem includes:    Crestor 40 Mg Tabs (Rosuvastatin calcium) .Marland Kitchen... Take 1 tablet by mouth once a day  Orders: T-Comprehensive Metabolic Panel (82956-21308)  Problem # 4:  HYPERTENSION (ICD-401.9) last took meds last night admits to compliance increase norvasc to 10 mg once daily may need to increase diovan/hct to 320/25 . . .has appt next week may need to add another agent (beta blocker or clonidine)   Her updated medication list for this problem includes:    Norvasc 10 Mg Tabs (Amlodipine besylate) .Marland Kitchen... Take 1 tablet by mouth once a day    Diovan Hct 320-12.5 Mg Tabs (Valsartan-hydrochlorothiazide) .Marland Kitchen... 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 Take 1 tablet by mouth once a day 2)  Diovan Hct 320-12.5 Mg Tabs (Valsartan-hydrochlorothiazide) .Marland Kitchen.. 1 by mouth q am 3)  Aspir-low 81 Mg Tbec (Aspirin) .... Take 1 tablet by mouth once a day with food 4)  Crestor 40 Mg Tabs (Rosuvastatin calcium) .... Take 1 tablet by mouth once a day 5)  Actos 30 Mg Tabs (Pioglitazone hcl) .Marland Kitchen.. 1 by mouth once daily 6)  Glucophage Xr 500 Mg Tb24 (Metformin hcl) .... 2 by mouth two times a day 7)  Fosamax 70 Mg Tabs (Alendronate sodium) .... Take one pill each week for bone strength 8)  Levothroid 125 Mcg Tabs (Levothyroxine sodium) .... Take 1 tablet by mouth once a day 9)  Naprosyn 500 Mg Tabs (Naproxen) .Marland KitchenMarland KitchenMarland Kitchen  Take 1 tablet by mouth every 12 hours as needed leg pain  Hypertension Assessment/Plan:      The patient's hypertensive risk group is category C: Target organ damage and/or diabetes.  Her calculated 10 year risk of coronary heart disease is 17 %.  Today's blood pressure is 151/76.  Her blood pressure goal is < 130/80.   Patient Instructions: 1)  Schedule retasure at [facility]. 2)  Increase acitivity.  Try to walk for about 20-30 minutes 4-5 days a week. Start slow (5 minutes a day) and slowly build up to 20-30 minutes.  Go at your own pace. Walk fast enough that you could not talk on a cell phone while walking.  Stop if you have chest pain or severe shortness of breath.   3)  Keep arm clean and dry. 4)  Keep open area covered when active.  5)  You can use neosporin applied once to twice a day. 6)  If you develop fever, chills, worsening pain or redness, call us as soon as possible. 7)  Finish all your antibiotics. 8)  Follow up next week with Tniyah Nakagawa for CPP as scheduled.  Check with the front desk before leaving for the date and time. 9)  Come in fasting to next appt if possible for labs (nothing to eat or drink for 8 hours before appt except water). 10)  Increase norvasc to 10 mg a day (you can take 2 of the 5 mg tablets to  make 10 mg). Prescriptions: NORVASC 10 MG TABS (AMLODIPINE BESYLATE) Take 1 tablet by mouth once a day  #30 x 5   Entered and Authorized by:   Tereso Newcomer PA-C   Signed by:   Tereso Newcomer PA-C on 03/20/2009   Method used:   Print then Give to Patient   RxID:   [id number]   Laboratory Results   Blood Tests     HGBA1C: 7.6%   (Normal Range: Non-Diabetic - 3-6%   Control Diabetic - 6-8%) CBG Random:: 179mg /dL       Last LDL:                                                 114 (12/26/2008 9:09:00 PM)        Diabetic Foot Exam  Set Next Diabetic Foot Exam here: 03/22/2010   10-g (5.07) Semmes-Weinstein Monofilament Test Performed by: Vesta Mixer CMA          Right Foot          Left Foot Visual Inspection               Test Control      normal         normal Site 1         normal         normal Site 2         normal         normal Site 3         normal         normal Site 4         normal         normal Site 5         normal         normal Site 6  normal         normal Site 7         normal         normal Site 8         normal         normal Site 9         normal         normal Site 10         normal         normal  Impression      normal         normal

## 2010-02-13 NOTE — Progress Notes (Signed)
Summary: Office Visit//DEPRESSION SCREENING  Office Visit//DEPRESSION SCREENING   Imported By: Arta Bruce 05/26/2009 14:41:12  _____________________________________________________________________  External Attachment:    Type:   Image     Comment:   External Document

## 2010-02-15 NOTE — Progress Notes (Signed)
  Phone Note From Pharmacy   Caller: [facility] - Pharmac Summary of Call: Request for refill of all the meds previously filled on 12/29/09.  [place]--could you please call on Monday and check to see if they received previous refill orders--if not give it to them on telephone.  Thanks Initial call taken by: Julieanne Manson MD,  January 21, 2010 11:40 PM  Follow-up for Phone Call        Per Ria Comment at [facility] pharmacy all refills sent on 12/29/09 were received and she has refills. Follow-up by: Levon Hedger,  January 23, 2010 12:10 PM

## 2010-03-19 ENCOUNTER — Other Ambulatory Visit (HOSPITAL_COMMUNITY): Payer: Self-pay | Admitting: *Deleted

## 2010-03-19 ENCOUNTER — Encounter (INDEPENDENT_AMBULATORY_CARE_PROVIDER_SITE_OTHER): Payer: Self-pay | Admitting: Nurse Practitioner

## 2010-03-19 ENCOUNTER — Encounter: Payer: Self-pay | Admitting: Nurse Practitioner

## 2010-03-19 DIAGNOSIS — Z1231 Encounter for screening mammogram for malignant neoplasm of breast: Secondary | ICD-10-CM

## 2010-03-19 LAB — CONVERTED CEMR LAB
Bilirubin Urine: NEGATIVE
Blood in Urine, dipstick: NEGATIVE
Glucose, Urine, Semiquant: NEGATIVE
Urobilinogen, UA: 0.2

## 2010-03-20 LAB — CONVERTED CEMR LAB
Albumin: 4.4 g/dL (ref 3.5–5.2)
Alkaline Phosphatase: 89 units/L (ref 39–117)
BUN: 8 mg/dL (ref 6–23)
Creatinine, Ser: 0.6 mg/dL (ref 0.40–1.20)
Eosinophils Absolute: 0.2 10*3/uL (ref 0.0–0.7)
Eosinophils Relative: 3 % (ref 0–5)
Glucose, Bld: 111 mg/dL — ABNORMAL HIGH (ref 70–99)
HCT: 39.5 % (ref 36.0–46.0)
HDL: 71 mg/dL (ref >39)
Hemoglobin: 12.3 g/dL (ref 12.0–15.0)
LDL Cholesterol: 110 mg/dL — ABNORMAL HIGH (ref 0–99)
Lymphs Abs: 2.3 10*3/uL (ref 0.7–4.0)
MCV: 82.1 fL (ref 78.0–100.0)
Monocytes Absolute: 0.3 10*3/uL (ref 0.1–1.0)
Monocytes Relative: 6 % (ref 3–12)
Neutrophils Relative %: 50 % (ref 43–77)
Potassium: 4.9 meq/L (ref 3.5–5.3)
RBC: 4.81 M/uL (ref 3.87–5.11)
Triglycerides: 110 mg/dL (ref <150)
WBC: 5.6 10*3/uL (ref 4.0–10.5)

## 2010-03-27 NOTE — Letter (Signed)
Summary: TEST  ORDER FORM/MAMMOGRAM//APPT DATE & TIME  TEST  ORDER FORM/MAMMOGRAM//APPT DATE & TIME   Imported By: Arta Bruce 03/20/2010 08:58:23  _____________________________________________________________________  External Attachment:    Type:   Image     Comment:   External Document

## 2010-03-27 NOTE — Letter (Signed)
Summary: Handout Printed  Printed Handout:  - Gingivitis-Brief

## 2010-03-27 NOTE — Assessment & Plan Note (Signed)
Summary: Diabetes/HTN   Vital Signs:  Patient profile:   65 year old female Menstrual status:  postmenopausal Weight:      154.38 pounds Temp:     98.3 degrees F oral Pulse rate:   70 / minute Pulse rhythm:   regular Resp:     16 per minute BP sitting:   153 / 68  (left arm) Cuff size:   regular  Vitals Entered By: Hale Drone CMA (March 19, 2010 8:43 AM) CC: 3 month f/u on DM and BP. , Hypertension Management, Lipid Management Is Patient Diabetic? Yes Pain Assessment Patient in pain? no      CBG Result 111 CBG Device ID non fasting  Does patient need assistance? Functional Status Self care Ambulation Normal   Primary Care Provider:  Tereso Newcomer PA-C  CC:  3 month f/u on DM and BP. , Hypertension Management, and Lipid Management.  History of Present Illness:  Pt into the office for f/u on diabetes and high blood pressure.  Pt presents today with all her medications purchase tickets. Reports that she needs refills on crestor.  Community liason present today during the exam - Spanish  Dental problems - pt has been going to the dentist and she has been taking antibiotics and using mouth was for "infection". Pt has questions about what is causing the infection.  Mammogram - last done 03/28/2009  Diabetes Management History:      The patient is a 65 years old female who comes in for evaluation of DM Type 2.  She has not been enrolled in the "Diabetic Education Program".  She states understanding of dietary principles and is following her diet appropriately.  No sensory loss is reported.  Self foot exams are not being performed.  She is not checking home blood sugars.  She says that she is exercising.  Type of exercise includes: walking.  Duration of exercise is estimated to be 30-60  She is doing this <3 times per week.        Hypoglycemic symptoms are not occurring.  No hyperglycemic symptoms are reported.    Hypertension History:      She denies headache, chest pain,  and palpitations.  She notes no problems with any antihypertensive medication side effects.  pt is fasting today for labs and not yet taken her medications.        Positive major cardiovascular risk factors include female age 54 years old or older, diabetes, hyperlipidemia, and hypertension.  Negative major cardiovascular risk factors include negative family history for ischemic heart disease and non-tobacco-user status.        Further assessment for target organ damage reveals no history of ASHD, stroke/TIA, or peripheral vascular disease.    Lipid Management History:      Positive NCEP/ATP III risk factors include female age 4 years old or older, diabetes, and hypertension.  Negative NCEP/ATP III risk factors include HDL cholesterol greater than 60, no family history for ischemic heart disease, non-tobacco-user status, no ASHD (atherosclerotic heart disease), no prior stroke/TIA, no peripheral vascular disease, and no history of aortic aneurysm.        The patient states that she knows about the "Therapeutic Lifestyle Change" diet.  The patient does not know about adjunctive measures for cholesterol lowering.  She notes side effects from her lipid-lowering medication.  The patient notes symptoms to suggest myopathy or liver disease.     Current Medications (verified): 1)  Norvasc 10 Mg Tabs (Amlodipine Besylate) .... Take  1 Tablet By Mouth Once A Day 2)  Diovan Hct 320-25 Mg Tabs (Valsartan-Hydrochlorothiazide) .... Take 1 Tablet By Mouth Once A Day 3)  Aspir-Low 81 Mg Tbec (Aspirin) .... Take 1 Tablet By Mouth Once A Day With Food 4)  Crestor 40 Mg Tabs (Rosuvastatin Calcium) .... Take 1 Tablet By Mouth Once A Day 5)  Actos 30 Mg Tabs (Pioglitazone Hcl) .Marland Kitchen.. 1 By Mouth Once Daily 6)  Glucophage Xr 500 Mg  Tb24 (Metformin Hcl) .... 2 By Mouth Two Times A Day 7)  Fosamax 70 Mg  Tabs (Alendronate Sodium) .... Take One Pill Each Week For Bone Strength 8)  Levothroid 125 Mcg Tabs (Levothyroxine  Sodium) .... Take 1 Tablet By Mouth Once A Day 9)  Caltrate 600+d Plus 600-400 Mg-Unit Tabs (Calcium Carbonate-Vit D-Min) .... Take 1 Tablet By Mouth Two Times A Day 10)  Glucose Meter .... Check Sugar Once Daily 11)  Glucose Meter Strips .... Check Sugar Once Daily 12)  Lancets .... Check Sugar Once Daily 13)  Glucotrol Xl 5 Mg Xr24h-Tab (Glipizide) .... Take 1 Tablet By Mouth Once A Day With A Meal For Diabetes.  Please Write in Spanish. 14)  Diclofenac Sodium 75 Mg Tbec (Diclofenac Sodium) .... Take 1 Tablet By Mouth Two Times A Day With Food As Needed For Pain (Please Write in Spanish)  Allergies (verified): No Known Drug Allergies  Review of Systems General:  Denies loss of appetite. CV:  Denies chest pain or discomfort. Resp:  Denies cough. GI:  Denies abdominal pain, nausea, and vomiting. MS:  ongoing problems with foot - she has done the exercises as ordered without good results. Neuro:  Denies headaches.  Physical Exam  General:  alert.   Head:  normocephalic.   Eyes:  glasses Ears:  ear piercing(s) noted.   Lungs:  normal breath sounds.   Heart:  normal rate and regular rhythm.    Diabetes Management Exam:    Foot Exam (with socks and/or shoes not present):       Sensory-Monofilament:          Left foot: normal          Right foot: normal   Impression & Recommendations:  Problem # 1:  DIABETES MELLITUS, TYPE II (ICD-250.00) doing well. good control. continue current meds. Her updated medication list for this problem includes:    Diovan Hct 320-25 Mg Tabs (Valsartan-hydrochlorothiazide) .Marland Kitchen... Take 1 tablet by mouth once a day    Aspir-low 81 Mg Tbec (Aspirin) .Marland Kitchen... Take 1 tablet by mouth once a day with food    Actos 30 Mg Tabs (Pioglitazone hcl) .Marland Kitchen... 1 by mouth once daily    Glucophage Xr 500 Mg Tb24 (Metformin hcl) .Marland Kitchen... 2 by mouth two times a day    Glucotrol Xl 5 Mg Xr24h-tab (Glipizide) .Marland Kitchen... Take 1 tablet by mouth once a day with a meal for diabetes.   please write in spanish.  Orders: Capillary Blood Glucose/CBG (82948) Hgb A1C (16109UE) UA Dipstick w/o Micro (manual) (45409)  Problem # 2:  HYPERLIPIDEMIA (ICD-272.4) will check labs today Her updated medication list for this problem includes:    Crestor 40 Mg Tabs (Rosuvastatin calcium) .Marland Kitchen... Take 1 tablet by mouth once a day  Orders: T-Lipid Profile (81191-47829)  Problem # 3:  HYPERTENSION (ICD-401.9) BP is slightly elevated today but pt is fasing today for labs Her updated medication list for this problem includes:    Norvasc 10 Mg Tabs (Amlodipine besylate) .Marland Kitchen... Take 1 tablet  by mouth once a day    Diovan Hct 320-25 Mg Tabs (Valsartan-hydrochlorothiazide) .Marland Kitchen... Take 1 tablet by mouth once a day  Orders: T-Comprehensive Metabolic Panel (09811-91478) T-CBC w/Diff (29562-13086)  Problem # 4:  HYPOTHYROIDISM (ICD-244.9) will check labs Her updated medication list for this problem includes:    Levothroid 125 Mcg Tabs (Levothyroxine sodium) .Marland Kitchen... Take 1 tablet by mouth once a day  Orders: T-TSH (57846-96295) T-Urine Microalbumin w/creat. ratio [phone])  Problem # 5:  ACUTE GINGIVITIS PLAQUE INDUCED (ICD-523.00) reviewed dx with pt handout given  Problem # 6:  OTHER SCREENING BREAST EXAMINATION (ICD-V76.19) will schedule for pt Orders: Mammogram (Screening) (Mammo)  Complete Medication List: 1)  Norvasc 10 Mg Tabs (Amlodipine besylate) .... Take 1 tablet by mouth once a day 2)  Diovan Hct 320-25 Mg Tabs (Valsartan-hydrochlorothiazide) .... Take 1 tablet by mouth once a day 3)  Aspir-low 81 Mg Tbec (Aspirin) .... Take 1 tablet by mouth once a day with food 4)  Crestor 40 Mg Tabs (Rosuvastatin calcium) .... Take 1 tablet by mouth once a day 5)  Actos 30 Mg Tabs (Pioglitazone hcl) .Marland Kitchen.. 1 by mouth once daily 6)  Glucophage Xr 500 Mg Tb24 (Metformin hcl) .... 2 by mouth two times a day 7)  Fosamax 70 Mg Tabs (Alendronate sodium) .... Take one pill each week for  bone strength 8)  Levothroid 125 Mcg Tabs (Levothyroxine sodium) .... Take 1 tablet by mouth once a day 9)  Caltrate 600+d Plus 600-400 Mg-unit Tabs (Calcium carbonate-vit d-min) .... Take 1 tablet by mouth two times a day 10)  Glucose Meter  .... Check sugar once daily 11)  Glucose Meter Strips  .... Check sugar once daily 12)  Lancets  .... Check sugar once daily 13)  Glucotrol Xl 5 Mg Xr24h-tab (Glipizide) .... Take 1 tablet by mouth once a day with a meal for diabetes.  please write in spanish. 14)  Diclofenac Sodium 75 Mg Tbec (Diclofenac sodium) .... Take 1 tablet by mouth two times a day with food as needed for pain (please write in spanish)  Diabetes Management Assessment/Plan:      The following lipid goals have been established for the patient: Total cholesterol goal of 200; LDL cholesterol goal of 100; HDL cholesterol goal of 40; Triglyceride goal of 150.  Her blood pressure goal is < 130/80.    Hypertension Assessment/Plan:      The patient's hypertensive risk group is category C: Target organ damage and/or diabetes.  Her calculated 10 year risk of coronary heart disease is 13 %.  Today's blood pressure is 153/68.  Her blood pressure goal is < 130/80.  Lipid Assessment/Plan:      Based on NCEP/ATP III, the patient's risk factor category is "history of diabetes".  The patient's lipid goals are as follows: Total cholesterol goal is 200; LDL cholesterol goal is 100; HDL cholesterol goal is 40; Triglyceride goal is 150.  Her LDL cholesterol goal has been met.     Patient Instructions: 1)  Your labs will be checked today and you will be notified of the results. 2)  Blood pressure 153/68 but his is likely because you have not eaten. Be sure to take your medications daily. 3)  Read the handout about infection of the gums 4)  Diabetes - Your Hgba1c = 6.6.  This is doing weill. 5)  Be sure to incorporate some type of exercise into your regimen into your daily routine which will make the  stiffness better 6)  You will be scheduled for a mammogram.  Keep the time/date of this appointment 7)  Follow up in 3 months for diabetes and high blood pressure.   Orders Added: 1)  Capillary Blood Glucose/CBG [82948] 2)  Hgb A1C [83036QW] 3)  T-Lipid Profile [80061-22930] 4)  T-Comprehensive Metabolic Panel [80053-22900] 5)  T-CBC w/Diff [04540-98119] 6)  T-TSH [14782-95621] 7)  T-Urine Microalbumin w/creat. ratio [[id number]] 8)  UA Dipstick w/o Micro (manual) [81002] 9)  Mammogram (Screening) [Mammo]     Diabetic Foot Exam    10-g (5.07) Semmes-Weinstein Monofilament Test Performed by: Hale Drone CMA          Right Foot          Left Foot Visual Inspection     normal         normal Test Control      normal         normal Site 1         normal         normal Site 2         normal         normal Site 3         normal         normal Site 4         normal         normal Site 5         normal         normal Site 6         normal         normal Site 7         normal         normal Site 8         normal         normal Site 9         normal         normal Site 10         normal         normal  Impression      normal         normal   Laboratory Results   Urine Tests  Date/Time Received: March 19, 2010 9:04 AM   Routine Urinalysis   Color: lt. yellow Glucose: negative   (Normal Range: Negative) Bilirubin: negative   (Normal Range: Negative) Ketone: negative   (Normal Range: Negative) Spec. Gravity: 1.015   (Normal Range: 1.003-1.035) Blood: negative   (Normal Range: Negative) pH: 8.0   (Normal Range: 5.0-8.0) Protein: trace   (Normal Range: Negative) Urobilinogen: 0.2   (Normal Range: 0-1) Nitrite: negative   (Normal Range: Negative) Leukocyte Esterace: negative   (Normal Range: Negative)     Blood Tests   Date/Time Received: March 19, 2010 8:57 AM   HGBA1C: 6.6%   (Normal Range: Non-Diabetic - 3-6%   Control Diabetic - 6-8%) CBG Random::  111mg /dL      Appended Document: Diabetes/HTN    Clinical Lists Changes  Orders: Added new Service order of Est. Patient Level IV (30865) - Signed

## 2010-03-30 LAB — GLUCOSE, CAPILLARY

## 2010-04-03 ENCOUNTER — Ambulatory Visit (HOSPITAL_COMMUNITY)
Admission: RE | Admit: 2010-04-03 | Discharge: 2010-04-03 | Disposition: A | Discharge status: Home / Self Care | Payer: Self-pay | Source: Ambulatory Visit | Attending: Internal Medicine | Admitting: Internal Medicine

## 2010-04-03 DIAGNOSIS — Z1231 Encounter for screening mammogram for malignant neoplasm of breast: Secondary | ICD-10-CM | POA: Insufficient documentation

## 2010-05-29 NOTE — Procedures (Signed)
RENAL ARTERY DUPLEX EVALUATION   INDICATION:  Uncontrolled high blood pressure and abdominal bruit.   HISTORY:  Diabetes:  yes.  Cardiac:  no  Hypertension:  yes  Smoking:  previous   RENAL ARTERY DUPLEX FINDINGS:  Aorta-Proximal:  99 cm/s  Aorta-Mid:  153 cm/s  Aorta-Distal:  131 cm/s  Celiac Artery Origin:  184 cm/s  SMA Origin:  180 cm/s  SMA proximal: 304 cm/s  SMA mid:  570 cm/s                                    RIGHT               LEFT  Renal Artery Origin:             190 Cm/s            118 cm/s  Renal Artery Proximal:           190 cm/s            125 cm/s  Renal Artery Mid:                177 cm/s            104 cm/s  Renal Artery Distal:             152 cm/s            94 cm/s  Hilar Acceleration Time (AT):    m/s2                m/s2  Renal-Aortic Ratio (RAR):        1.24                0.8  Kidney Size:                     10.2 cm             10.3 cm  End Diastolic Ratio (EDR):  Resistive Index (RI):            0.65                0.69   IMPRESSION:  1. No evidence of renal artery stenosis noted in the bilateral renal      arteries.  2. The bilateral kidney length and intrarenal resistive indices are      within normal limits.  3. No evidence of celiac artery stenosis; however increased velocity      is noted in the proximal to mid superior mesenteric artery.       ___________________________________________  Quita Skye Hart [place], M.D.   NT/MEDQ  D:  07/31/2009  T:  07/31/2009  Job:  [id number]

## 2010-10-16 LAB — POCT I-STAT, CHEM 8
BUN: 9
Calcium, Ion: 1.16
Creatinine, Ser: 0.8
Glucose, Bld: 155 — ABNORMAL HIGH
Hemoglobin: 12.9
TCO2: 28

## 2010-10-16 LAB — DIFFERENTIAL
Basophils Relative: 0
Lymphocytes Relative: 29
Monocytes Relative: 7
Neutro Abs: 5.1
Neutrophils Relative %: 61

## 2010-10-16 LAB — URINALYSIS, ROUTINE W REFLEX MICROSCOPIC
Nitrite: NEGATIVE
Specific Gravity, Urine: 1.012
Urobilinogen, UA: 0.2
pH: 5.5

## 2010-10-16 LAB — URINE MICROSCOPIC-ADD ON

## 2010-10-16 LAB — CBC
MCHC: 33.2
RBC: 4.48
WBC: 8.3

## 2010-12-03 ENCOUNTER — Emergency Department (HOSPITAL_COMMUNITY)
Admission: EM | Admit: 2010-12-03 | Discharge: 2010-12-03 | Disposition: A | Discharge status: Home / Self Care | Payer: Self-pay | Attending: Emergency Medicine | Admitting: Emergency Medicine

## 2010-12-03 ENCOUNTER — Emergency Department (HOSPITAL_COMMUNITY): Payer: Self-pay

## 2010-12-03 DIAGNOSIS — M1711 Unilateral primary osteoarthritis, right knee: Secondary | ICD-10-CM

## 2010-12-03 DIAGNOSIS — M1710 Unilateral primary osteoarthritis, unspecified knee: Secondary | ICD-10-CM | POA: Insufficient documentation

## 2010-12-03 DIAGNOSIS — I10 Essential (primary) hypertension: Secondary | ICD-10-CM | POA: Insufficient documentation

## 2010-12-03 DIAGNOSIS — M25569 Pain in unspecified knee: Secondary | ICD-10-CM | POA: Insufficient documentation

## 2010-12-03 DIAGNOSIS — Z79899 Other long term (current) drug therapy: Secondary | ICD-10-CM | POA: Insufficient documentation

## 2010-12-03 DIAGNOSIS — E119 Type 2 diabetes mellitus without complications: Secondary | ICD-10-CM | POA: Insufficient documentation

## 2010-12-03 DIAGNOSIS — Z7982 Long term (current) use of aspirin: Secondary | ICD-10-CM | POA: Insufficient documentation

## 2010-12-03 HISTORY — DX: Essential (primary) hypertension: I10

## 2010-12-03 MED ORDER — HYDROCODONE-ACETAMINOPHEN 5-325 MG PO TABS: 1.0000 | ORAL_TABLET | Freq: Four times a day (QID) | ORAL | Status: AC | PRN

## 2010-12-03 NOTE — ED Notes (Signed)
Pt has been taken to radiology.  No distress when she left

## 2010-12-03 NOTE — ED Notes (Signed)
Called ortho for knee sleeve

## 2010-12-03 NOTE — Progress Notes (Signed)
Orthopedic Tech Progress Note Patient Details:  Linda House 1945/09/15 [id number]  Other Ortho Devices Type of Ortho Device: Crutches;Knee Sleeve   Cammer, Mickie Bail 12/03/2010, 12:00 PM

## 2010-12-03 NOTE — ED Notes (Signed)
Walking down the steps on Saturday and developed. Rt. Knee pain, which is swollen

## 2010-12-03 NOTE — ED Provider Notes (Signed)
History     CSN: [id number] Arrival date & time: 12/03/2010  9:41 AM   First MD Initiated Contact with Patient 12/03/10 1024      Chief Complaint  Patient presents with  . Knee Pain   HPI Patient seen and evaluated for right knee pain. Reports that she was walking down the stairs when she developed the pain. Patient denies any trauma or falls. Patient denies any other injuries. Patient denies any ankle pain. Patient has pain in his right knee on and off. Patient is able to ambulate without problems.   Past Medical History  Diagnosis Date  . Diabetes mellitus   . Hypertension     History reviewed. No pertinent past surgical history.  History reviewed. No pertinent family history.  History  Substance Use Topics  . Smoking status: Never Smoker   . Smokeless tobacco: Never Used  . Alcohol Use: No    OB History    Grav Para Term Preterm Abortions TAB SAB Ect Mult Living                  Review of Systems  Constitutional: Negative for fever, chills, diaphoresis and appetite change.  HENT: Negative for neck pain.   Eyes: Negative for photophobia and visual disturbance.  Respiratory: Negative for cough, chest tightness and shortness of breath.   Cardiovascular: Negative for chest pain.  Gastrointestinal: Negative for nausea, vomiting and abdominal pain.  Genitourinary: Negative for flank pain.  Musculoskeletal: Negative for back pain and gait problem.  Skin: Negative for rash.  Neurological: Negative for weakness and numbness.  All other systems reviewed and are negative.    Allergies  Review of patient's allergies indicates no known allergies.  Home Medications   Current Outpatient Rx  Name Route Sig Dispense Refill  . ALENDRONATE SODIUM 70 MG PO TABS Oral Take 70 mg by mouth every 7 (seven) days. Hold while in hospital. Take with a full glass of water on an empty stomach.     . AMLODIPINE BESYLATE 10 MG PO TABS Oral Take 10 mg by mouth daily.      . ASPIRIN EC  81 MG PO TBEC Oral Take 81 mg by mouth daily.      Marland Kitchen CALCIUM 500-125 MG-UNIT PO TABS Oral Take 1 tablet by mouth daily.      Marland Kitchen GLIPIZIDE ER 5 MG PO TB24 Oral Take 5 mg by mouth daily.      Marland Kitchen LEVOTHYROXINE SODIUM 125 MCG PO TABS Oral Take 125 mcg by mouth daily.      Marland Kitchen METFORMIN HCL ER 500 MG PO TB24 Oral Take 500 mg by mouth 2 (two) times daily.      Marland Kitchen PIOGLITAZONE HCL 30 MG PO TABS Oral Take 30 mg by mouth daily.      Marland Kitchen ROSUVASTATIN CALCIUM 40 MG PO TABS Oral Take 40 mg by mouth daily.      Marland Kitchen VALSARTAN-HYDROCHLOROTHIAZIDE 320-25 MG PO TABS Oral Take 1 tablet by mouth daily.        BP 146/57  Pulse 72  Temp(Src) 97.5 F (36.4 C) (Oral)  Resp 20  SpO2 98%  Physical Exam  Constitutional: She is oriented to person, place, and time. She appears well-developed and well-nourished. No distress.  HENT:  Head: Normocephalic and atraumatic.  Eyes: EOM are normal. Pupils are equal, round, and reactive to light.  Neck: Normal range of motion.  Cardiovascular: Normal rate and regular rhythm.  Exam reveals no gallop and no friction rub.  No murmur heard. Pulmonary/Chest: Effort normal and breath sounds normal.  Abdominal: There is no tenderness. There is no rebound.  Musculoskeletal:       Right knee: She exhibits normal range of motion, no swelling, no deformity, no LCL laxity, normal patellar mobility, no bony tenderness, normal meniscus and no MCL laxity. tenderness found. Medial joint line, lateral joint line, MCL and LCL tenderness noted. No patellar tendon tenderness noted.  Neurological: She is alert and oriented to person, place, and time. She has normal reflexes. No sensory deficit.  Skin: She is not diaphoretic.    ED Course  Procedures (including critical care time)  11:57 AM Patient seen and re-evaluated. Resting comfortably. VSS stable. NAD. Patient notified of testing results. Stated agreement and understanding. Patient stated understanding to treatment plan and diagnosis. Knee  sleeve and crutches.  Dg Knee Complete 4 Views Right  12/03/2010  *RADIOLOGY REPORT*  Clinical Data: Posterior knee pain.  No known injury.  RIGHT KNEE - COMPLETE 4+ VIEW  Comparison: None.  Findings: There is a small to moderate knee joint effusion.  The mineralization and alignment are normal.  There is mild medial joint space loss.  The additional joint spaces are preserved. There is no evidence of acute fracture or dislocation.  IMPRESSION: No acute osseous findings.  Small to moderate knee joint effusion.  Original Report Authenticated By: Gerrianne Scale, M.D.      MDM  DJD of right knee        Demetrius Charity, PA 12/03/10 1157

## 2010-12-03 NOTE — ED Provider Notes (Signed)
Medical screening examination/treatment/procedure(s) were performed by non-physician practitioner and as supervising physician I was immediately available for consultation/collaboration.  Raeford Razor, MD 12/03/10 2155

## 2012-03-16 ENCOUNTER — Emergency Department (HOSPITAL_COMMUNITY)
Admission: EM | Admit: 2012-03-16 | Discharge: 2012-03-16 | Disposition: A | Discharge status: Home / Self Care | Payer: No Typology Code available for payment source | Source: Home / Self Care

## 2012-03-16 ENCOUNTER — Encounter (HOSPITAL_COMMUNITY): Payer: Self-pay

## 2012-03-16 DIAGNOSIS — E119 Type 2 diabetes mellitus without complications: Secondary | ICD-10-CM

## 2012-03-16 DIAGNOSIS — I10 Essential (primary) hypertension: Secondary | ICD-10-CM

## 2012-03-16 LAB — TSH: TSH: 5.218 u[IU]/mL — ABNORMAL HIGH (ref 0.350–4.500)

## 2012-03-16 LAB — HEMOGLOBIN A1C
Hgb A1c MFr Bld: 10.6 % — ABNORMAL HIGH (ref <5.7)
Mean Plasma Glucose: 258 mg/dL — ABNORMAL HIGH (ref <117)

## 2012-03-16 LAB — COMPREHENSIVE METABOLIC PANEL
AST: 16 U/L (ref 0–37)
Albumin: 3.9 g/dL (ref 3.5–5.2)
BUN: 8 mg/dL (ref 6–23)
Calcium: 9.8 mg/dL (ref 8.4–10.5)
Chloride: 100 mEq/L (ref 96–112)
Creatinine, Ser: 0.55 mg/dL (ref 0.50–1.10)
GFR calc non Af Amer: >90 mL/min (ref >90)
Total Bilirubin: 0.4 mg/dL (ref 0.3–1.2)

## 2012-03-16 LAB — CBC
MCH: 26.6 pg (ref 26.0–34.0)
MCHC: 34.1 g/dL (ref 30.0–36.0)
MCV: 78.2 fL (ref 78.0–100.0)
Platelets: 329 10*3/uL (ref 150–400)

## 2012-03-16 MED ORDER — LEVOTHYROXINE SODIUM 125 MCG PO TABS: 125.0000 ug | ORAL_TABLET | Freq: Every day | ORAL | Status: DC

## 2012-03-16 MED ORDER — PIOGLITAZONE HCL 30 MG PO TABS: 30.0000 mg | ORAL_TABLET | Freq: Every day | ORAL | Status: DC

## 2012-03-16 MED ORDER — ASPIRIN EC 81 MG PO TBEC: 81.0000 mg | DELAYED_RELEASE_TABLET | Freq: Every day | ORAL | Status: DC

## 2012-03-16 MED ORDER — METFORMIN HCL ER 500 MG PO TB24: 500.0000 mg | ORAL_TABLET | Freq: Two times a day (BID) | ORAL | Status: DC

## 2012-03-16 MED ORDER — VALSARTAN-HYDROCHLOROTHIAZIDE 320-25 MG PO TABS: 1.0000 | ORAL_TABLET | Freq: Every day | ORAL | Status: DC

## 2012-03-16 MED ORDER — ROSUVASTATIN CALCIUM 40 MG PO TABS: 40.0000 mg | ORAL_TABLET | Freq: Every day | ORAL | Status: DC

## 2012-03-16 NOTE — ED Provider Notes (Signed)
History     CSN: [id number]  Arrival date & time 03/16/12  4860  67 year old female who is here to establish care. The patient states that she has not had any of her medications for the last 10 months. She is here for refill on all of her medications. The patient has a history of hypothyroidism, hypertension, diabetes,she denies any chest pain shortness of breath orthopnea paroxysmal nocturnal dyspnea or dependent edema.    Chief Complaint  Patient presents with  . Medication Refill    (Consider location/radiation/quality/duration/timing/severity/associated sxs/prior treatment) HPI  Past Medical History  Diagnosis Date  . Diabetes mellitus   . Hypertension     History reviewed. No pertinent past surgical history.  No family history on file.  History  Substance Use Topics  . Smoking status: Never Smoker   . Smokeless tobacco: Never Used  . Alcohol Use: No    OB History   Grav Para Term Preterm Abortions TAB SAB Ect Mult Living                  Review of Systems Complete 14 point review of systems was done and was found to be negative  Allergies  Review of patient's allergies indicates no known allergies.  Home Medications   Current Outpatient Rx  Name  Route  Sig  Dispense  Refill  . alendronate (FOSAMAX) 70 MG tablet   Oral   Take 70 mg by mouth every 7 (seven) days. Hold while in hospital. Take with a full glass of water on an empty stomach.          Marland Kitchen amLODipine (NORVASC) 10 MG tablet   Oral   Take 10 mg by mouth daily.           Marland Kitchen aspirin EC 81 MG tablet   Oral   Take 1 tablet (81 mg total) by mouth daily.   30 tablet   2   . Calcium 500-125 MG-UNIT TABS   Oral   Take 1 tablet by mouth daily.           Marland Kitchen glipiZIDE (GLUCOTROL XL) 5 MG 24 hr tablet   Oral   Take 5 mg by mouth daily.          Marland Kitchen levothyroxine (SYNTHROID, LEVOTHROID) 125 MCG tablet   Oral   Take 1 tablet (125 mcg total) by mouth daily.   30 tablet   2   . metFORMIN  (GLUCOPHAGE-XR) 500 MG 24 hr tablet   Oral   Take 1 tablet (500 mg total) by mouth 2 (two) times daily.   60 tablet   2   . pioglitazone (ACTOS) 30 MG tablet   Oral   Take 1 tablet (30 mg total) by mouth daily.   30 tablet   2   . rosuvastatin (CRESTOR) 40 MG tablet   Oral   Take 1 tablet (40 mg total) by mouth daily.   30 tablet   3   . valsartan-hydrochlorothiazide (DIOVAN-HCT) 320-25 MG per tablet   Oral   Take 1 tablet by mouth daily.   30 tablet   3     BP 179/66  Pulse 71  Temp(Src) 98 F (36.7 C) (Oral)  Resp 18  SpO2 99%  Physical Exam General: alert, well-developed, and well-nourished.  Head: normocephalic and atraumatic.  Neck: supple and no carotid bruits.  Lungs: normal breath sounds, no crackles, and no wheezes.  Heart: normal rate and regular rhythm.  Msk: right foot:  +  pain with palpation over heel  no deformity   ED Course  Procedures (including critical care time)  Labs Reviewed - No data to display No results found.   No diagnosis found.    MDM  #1Diabetes mellitus type 2 we'll check a hemoglobin A1c, continue metformin, continue Actos. Will provide a prescription for glucometer #2Hypertension the patient has been provided a prescription for valsartan-hydrochlorothiazide. #3 hypothyroidism we'll check a TSH and continue Synthroid #64followup in 2 months        Richarda Overlie, MD 03/16/12 1227

## 2012-03-16 NOTE — ED Notes (Signed)
Patient here to establish doctor- history of DM

## 2012-05-23 ENCOUNTER — Encounter (HOSPITAL_COMMUNITY): Payer: Self-pay | Admitting: Emergency Medicine

## 2012-05-23 ENCOUNTER — Emergency Department (HOSPITAL_COMMUNITY)
Admission: EM | Admit: 2012-05-23 | Discharge: 2012-05-23 | Disposition: A | Discharge status: Home / Self Care | Payer: No Typology Code available for payment source | Source: Home / Self Care | Attending: Emergency Medicine | Admitting: Emergency Medicine

## 2012-05-23 DIAGNOSIS — T148XXA Other injury of unspecified body region, initial encounter: Secondary | ICD-10-CM

## 2012-05-23 DIAGNOSIS — IMO0002 Reserved for concepts with insufficient information to code with codable children: Secondary | ICD-10-CM

## 2012-05-23 MED ORDER — CEPHALEXIN 500 MG PO CAPS: 500.0000 mg | ORAL_CAPSULE | Freq: Three times a day (TID) | ORAL | Status: DC

## 2012-05-23 MED ORDER — IBUPROFEN 800 MG PO TABS
ORAL_TABLET | ORAL | Status: AC
  Filled 2012-05-23: qty 1

## 2012-05-23 MED ORDER — HYDROCODONE-ACETAMINOPHEN 5-325 MG PO TABS
1.0000 | ORAL_TABLET | Freq: Once | ORAL | Status: AC
  Administered 2012-05-23: 1 via ORAL

## 2012-05-23 MED ORDER — IBUPROFEN 800 MG PO TABS: 800.0000 mg | ORAL_TABLET | Freq: Once | ORAL | Status: DC

## 2012-05-23 MED ORDER — HYDROCODONE-ACETAMINOPHEN 5-325 MG PO TABS
ORAL_TABLET | ORAL | Status: AC
  Filled 2012-05-23: qty 1

## 2012-05-23 MED ORDER — HYDROCODONE-ACETAMINOPHEN 5-325 MG PO TABS: ORAL_TABLET | ORAL | Status: DC

## 2012-05-23 NOTE — ED Notes (Addendum)
Pt c/o puncture wound to left hand/palm w/tip of knife onset 1 hour ago while cooking w/a knife.  Puncture wound <0.5cm  Last tetanus w/in 2 yrs; hx of DM She is alert and oriented w/no signs of acute distress.

## 2012-05-23 NOTE — ED Provider Notes (Signed)
Chief Complaint:   Chief Complaint  Patient presents with  . Extremity Laceration    History of Present Illness:   Linda House is a 67 year old female who cut the palm of her left hand on a knife at home about an hour ago. She was slicing some vegetables. The knife slipped. She is able to flex all her fingers without any difficulty or hurts to flex her middle finger. She describes some numbness in the tip of the little finger. Her last tetanus shot was 2-1/2 years ago.  Review of Systems:  Other than noted above, the patient denies any of the following symptoms: Systemic:  No fever or chills. Musculoskeletal:  No joint pain or decreased range of motion. Neuro:  No numbness, tingling, or weakness.  PMFSH:  Past medical history, family history, social history, meds, and allergies were reviewed. She has diabetes, hypertension, and hyperlipidemia. She takes Fosamax, amlodipine, aspirin, Glucotrol, Synthroid, metformin, Actos, Crestor, and Diovan/HCTZ.  Physical Exam:   Vital signs:  BP 147/66  Pulse 81  Temp(Src) 97.8 F (36.6 C) (Oral)  Resp 16  SpO2 99% Ext:  There is a small 1 cm stab wound in the palm of the hand just overlying the flexor tendon of the middle finger. She is able to flex her middle finger all the way and the flexor mechanism is intact as well as extensor mechanism. There was a small amount of bleeding. There is no obvious contamination the wound or foreign body present.  All other joints had a full ROM without pain.  Pulses were full.  Good capillary refill in all digits.  No edema. Neurological:  Alert and oriented.  No muscle weakness.  Sensation was intact to light touch.   Procedure: Verbal informed consent was obtained.  The patient was informed of the risks and benefits of the procedure and understands and accepts.  Identity of the patient was verified verbally and by wristband.   The laceration area described above was prepped with Betadine and saline   and anesthetized with 5 mL of 2% Xylocaine without epinephrine.  The wound was then closed as follows:  Wound edges were approximated with 3 5-0 nylon sutures.  There were no immediate complications, and the patient tolerated the procedure well. The laceration was then cleansed, Bacitracin ointment was applied and a clean, dry pressure dressing was put on.   Course in Urgent Care Center:   Given Norco 5/325 for pain.  Assessment:  The encounter diagnosis was Laceration.  Plan:   1.  The following meds were prescribed:   Discharge Medication List as of 05/23/2012  6:04 PM    START taking these medications   Details  cephALEXin (KEFLEX) 500 MG capsule Take 1 capsule (500 mg total) by mouth 3 (three) times daily., Starting 05/23/2012, Until Discontinued, Normal    HYDROcodone-acetaminophen (NORCO/VICODIN) 5-325 MG per tablet 1 to 2 tabs every 4 to 6 hours as needed for pain., Print       2.  The patient was instructed in wound care and pain control, and handouts were given. 3.  The patient was told to return in 14 days for suture removal or wound recheck or sooner if any sign of infection.     Reuben Likes, MD 05/23/12 [phone]

## 2012-06-08 ENCOUNTER — Emergency Department (INDEPENDENT_AMBULATORY_CARE_PROVIDER_SITE_OTHER)
Admission: EM | Admit: 2012-06-08 | Discharge: 2012-06-08 | Disposition: A | Discharge status: Home / Self Care | Payer: No Typology Code available for payment source | Source: Home / Self Care | Attending: Family Medicine | Admitting: Family Medicine

## 2012-06-08 ENCOUNTER — Encounter (HOSPITAL_COMMUNITY): Payer: Self-pay

## 2012-06-08 DIAGNOSIS — Z4802 Encounter for removal of sutures: Secondary | ICD-10-CM

## 2012-06-08 NOTE — ED Provider Notes (Addendum)
History     CSN: [id number]  Arrival date & time 06/08/12  1109   First MD Initiated Contact with Patient 06/08/12 1147      Chief Complaint  Patient presents with  . Suture / Staple Removal    (Consider location/radiation/quality/duration/timing/severity/associated sxs/prior treatment) HPI Comments: 67 year old right-handed female with history of diabetes and a recent stab wound in her left hand status post suture on may 10. Here for suture removal and wound check. State that the wound is minimally tender denies redness swelling or drainage. Patient reports mild pain type of discomfort at palm close to her wound and at the base of third finger with flexion and extension but is able to perform all the movements without locking or weakness or other than minimal discomfort. Denies numbness, weakness or paresthesias in the entire left hand. She works making cookies. States she finished prescribed antibiotic as instructed.   Past Medical History  Diagnosis Date  . Diabetes mellitus   . Hypertension     History reviewed. No pertinent past surgical history.  History reviewed. No pertinent family history.  History  Substance Use Topics  . Smoking status: Never Smoker   . Smokeless tobacco: Never Used  . Alcohol Use: No    OB History   Grav Para Term Preterm Abortions TAB SAB Ect Mult Living                  Review of Systems  Constitutional: Negative for fever and chills.  Musculoskeletal:       As per HPI  Skin: Positive for wound.  Neurological: Negative for weakness and numbness.  All other systems reviewed and are negative.    Allergies  Review of patient's allergies indicates no known allergies.  Home Medications   Current Outpatient Rx  Name  Route  Sig  Dispense  Refill  . alendronate (FOSAMAX) 70 MG tablet   Oral   Take 70 mg by mouth every 7 (seven) days. Hold while in hospital. Take with a full glass of water on an empty stomach.          Marland Kitchen  amLODipine (NORVASC) 10 MG tablet   Oral   Take 10 mg by mouth daily.           Marland Kitchen aspirin EC 81 MG tablet   Oral   Take 1 tablet (81 mg total) by mouth daily.   30 tablet   2   . Calcium 500-125 MG-UNIT TABS   Oral   Take 1 tablet by mouth daily.           . cephALEXin (KEFLEX) 500 MG capsule   Oral   Take 1 capsule (500 mg total) by mouth 3 (three) times daily.   30 capsule   0   . glipiZIDE (GLUCOTROL XL) 5 MG 24 hr tablet   Oral   Take 5 mg by mouth daily.          Marland Kitchen HYDROcodone-acetaminophen (NORCO/VICODIN) 5-325 MG per tablet      1 to 2 tabs every 4 to 6 hours as needed for pain.   20 tablet   0   . levothyroxine (SYNTHROID, LEVOTHROID) 125 MCG tablet   Oral   Take 1 tablet (125 mcg total) by mouth daily.   30 tablet   2   . metFORMIN (GLUCOPHAGE-XR) 500 MG 24 hr tablet   Oral   Take 1 tablet (500 mg total) by mouth 2 (two) times daily.   60 tablet  2   . pioglitazone (ACTOS) 30 MG tablet   Oral   Take 1 tablet (30 mg total) by mouth daily.   30 tablet   2   . rosuvastatin (CRESTOR) 40 MG tablet   Oral   Take 1 tablet (40 mg total) by mouth daily.   30 tablet   3   . valsartan-hydrochlorothiazide (DIOVAN-HCT) 320-25 MG per tablet   Oral   Take 1 tablet by mouth daily.   30 tablet   3     BP 157/77  Pulse 84  Temp(Src) 98.1 F (36.7 C) (Oral)  Resp 18  SpO2 96%  Physical Exam  Nursing note and vitals reviewed. Constitutional: She is oriented to person, place, and time. She appears well-developed and well-nourished.  Cardiovascular: Normal heart sounds.   Pulmonary/Chest: Breath sounds normal.  Musculoskeletal:  Left hand: small horizonal sutured laceration in the center of the left palm appears healing well. 3 sutures removed without bleeding or drainage. No swelling, retraction or signs of infection. Minimal discomfort with third finger flexion  but otherwise patient presents with third finger full range of motion (normal MPJ,  IPJ,DIPJ flexion and extension passively and against resistance) normal strength and also 3rd finger and entire left hand appears neurovascularly intact.  Neurological: She is alert and oriented to person, place, and time.  Skin:  See MS Exam    ED Course  Procedures (including critical care time)  Labs Reviewed - No data to display No results found.   1. Visit for suture removal       MDM  Small stab wound at the center of left palm status post suture removal today. Wound healing properly. No signs of infection. Minimal discomfort with third finger flexion and extension possible caused by mild injury or sweeling of the third flexor tendon but otherwise patient presents with full range of motion and strength of the middle finger and appears neurovascularly intact. Supportive care including rehabilitation exercises and ice therapy after her rehabilitation exercises and red flags that should prompt her return to medical attention discussed with patient and Spanish and provided in writing.        Sharin Grave, MD 06/08/12 1234  Sharin Grave, MD 06/08/12 1239

## 2012-06-08 NOTE — ED Notes (Signed)
Suture removal for wound to palmar aspect of hand 5-10 injury

## 2012-11-19 ENCOUNTER — Encounter: Payer: Self-pay | Admitting: Internal Medicine

## 2012-11-19 ENCOUNTER — Ambulatory Visit: Payer: Self-pay | Attending: Internal Medicine | Admitting: Internal Medicine

## 2012-11-19 VITALS — BP 149/78 | HR 67 | Temp 98.2°F | Resp 16 | Ht 59.0 in | Wt 157.0 lb

## 2012-11-19 DIAGNOSIS — I10 Essential (primary) hypertension: Secondary | ICD-10-CM | POA: Insufficient documentation

## 2012-11-19 DIAGNOSIS — E785 Hyperlipidemia, unspecified: Secondary | ICD-10-CM | POA: Insufficient documentation

## 2012-11-19 DIAGNOSIS — E119 Type 2 diabetes mellitus without complications: Secondary | ICD-10-CM | POA: Insufficient documentation

## 2012-11-19 DIAGNOSIS — E7800 Pure hypercholesterolemia, unspecified: Secondary | ICD-10-CM | POA: Insufficient documentation

## 2012-11-19 DIAGNOSIS — M810 Age-related osteoporosis without current pathological fracture: Secondary | ICD-10-CM | POA: Insufficient documentation

## 2012-11-19 DIAGNOSIS — E039 Hypothyroidism, unspecified: Secondary | ICD-10-CM | POA: Insufficient documentation

## 2012-11-19 LAB — LIPID PANEL
Cholesterol: 303 mg/dL — ABNORMAL HIGH (ref 0–200)
Total CHOL/HDL Ratio: 4.5 Ratio
Triglycerides: 148 mg/dL (ref <150)
VLDL: 30 mg/dL (ref 0–40)

## 2012-11-19 LAB — CBC WITH DIFFERENTIAL/PLATELET
Eosinophils Absolute: 0.2 10*3/uL (ref 0.0–0.7)
Eosinophils Relative: 4 % (ref 0–5)
HCT: 39.4 % (ref 36.0–46.0)
Lymphs Abs: 3.2 10*3/uL (ref 0.7–4.0)
MCH: 25.7 pg — ABNORMAL LOW (ref 26.0–34.0)
MCHC: 33.8 g/dL (ref 30.0–36.0)
MCV: 76.1 fL — ABNORMAL LOW (ref 78.0–100.0)
Monocytes Absolute: 0.4 10*3/uL (ref 0.1–1.0)
Neutrophils Relative %: 43 % (ref 43–77)
Platelets: 340 10*3/uL (ref 150–400)
RBC: 5.18 MIL/uL — ABNORMAL HIGH (ref 3.87–5.11)

## 2012-11-19 LAB — TSH: TSH: 7.614 u[IU]/mL — ABNORMAL HIGH (ref 0.350–4.500)

## 2012-11-19 LAB — CMP AND LIVER
ALT: 17 U/L (ref 0–35)
AST: 16 U/L (ref 0–37)
BUN: 9 mg/dL (ref 6–23)
Bilirubin, Direct: 0.1 mg/dL (ref 0.0–0.3)
CO2: 27 mEq/L (ref 19–32)
Chloride: 100 mEq/L (ref 96–112)
Creat: 0.6 mg/dL (ref 0.50–1.10)
Glucose, Bld: 185 mg/dL — ABNORMAL HIGH (ref 70–99)
Indirect Bilirubin: 0.3 mg/dL (ref 0.0–0.9)
Sodium: 137 mEq/L (ref 135–145)
Total Bilirubin: 0.4 mg/dL (ref 0.3–1.2)

## 2012-11-19 LAB — POCT GLYCOSYLATED HEMOGLOBIN (HGB A1C): Hemoglobin A1C: 12.8

## 2012-11-19 LAB — T4, FREE: Free T4: 1 ng/dL (ref 0.80–1.80)

## 2012-11-19 MED ORDER — BENZOCAINE-RESORCINOL 5-2 % VA CREA: TOPICAL_CREAM | Freq: Every day | VAGINAL | Status: DC

## 2012-11-19 MED ORDER — ALENDRONATE SODIUM 70 MG PO TABS: 70.0000 mg | ORAL_TABLET | ORAL | Status: DC

## 2012-11-19 MED ORDER — CALCIUM 500-125 MG-UNIT PO TABS: 1.0000 | ORAL_TABLET | Freq: Every day | ORAL | Status: DC

## 2012-11-19 MED ORDER — GLIPIZIDE ER 5 MG PO TB24: 5.0000 mg | ORAL_TABLET | Freq: Every day | ORAL | Status: DC

## 2012-11-19 MED ORDER — PIOGLITAZONE HCL 30 MG PO TABS: 30.0000 mg | ORAL_TABLET | Freq: Every day | ORAL | Status: DC

## 2012-11-19 MED ORDER — ASPIRIN EC 81 MG PO TBEC: 81.0000 mg | DELAYED_RELEASE_TABLET | Freq: Every day | ORAL | Status: DC

## 2012-11-19 MED ORDER — VALSARTAN-HYDROCHLOROTHIAZIDE 320-25 MG PO TABS: 1.0000 | ORAL_TABLET | Freq: Every day | ORAL | Status: DC

## 2012-11-19 MED ORDER — METFORMIN HCL 1000 MG PO TABS: 1000.0000 mg | ORAL_TABLET | Freq: Two times a day (BID) | ORAL | Status: DC

## 2012-11-19 MED ORDER — AMLODIPINE BESYLATE 10 MG PO TABS: 10.0000 mg | ORAL_TABLET | Freq: Every day | ORAL | Status: DC

## 2012-11-19 MED ORDER — FLUCONAZOLE 150 MG PO TABS: 150.0000 mg | ORAL_TABLET | Freq: Once | ORAL | Status: DC

## 2012-11-19 MED ORDER — ROSUVASTATIN CALCIUM 40 MG PO TABS: 40.0000 mg | ORAL_TABLET | Freq: Every day | ORAL | Status: DC

## 2012-11-19 MED ORDER — LEVOTHYROXINE SODIUM 125 MCG PO TABS: 125.0000 ug | ORAL_TABLET | Freq: Every day | ORAL | Status: DC

## 2012-11-19 NOTE — Progress Notes (Signed)
Pt is here to establish care. Pt is here to manage her diabetes and HTN. Pt is requesting a refill of her medications. Pt has daughter here to translate for her. I tried to give an eye exam unable to preform test because of language barrier.

## 2012-11-19 NOTE — Progress Notes (Signed)
Patient ID: Linda House, female   DOB: [birth year], 67 y.o.   MRN: [id number] Patient Demographics  Linda House, is a 67 y.o. female  WGN:[id number]  HQI:[id number]  DOB - Oct 15, 1945  CC:  Chief Complaint  Patient presents with  . Establish Care       HPI: Linda House is a 67 y.o. female here today to establish medical care. She has history of hypertension and diabetes, dyslipidemia, osteoporosis, previous TIA, hypothyroidism. She is on medications as listed below. She has no significant complaint today. Pt is requesting a refill of her medications. Pt has daughter here to translate for her. She claims compliant with medications and follow-ups. Patient has No headache, No chest pain, No abdominal pain - No Nausea, No new weakness tingling or numbness, No Cough - SOB.  No Known Allergies Past Medical History  Diagnosis Date  . Diabetes mellitus   . Hypertension    Current Outpatient Prescriptions on File Prior to Visit  Medication Sig Dispense Refill  . cephALEXin (KEFLEX) 500 MG capsule Take 1 capsule (500 mg total) by mouth 3 (three) times daily.  30 capsule  0  . HYDROcodone-acetaminophen (NORCO/VICODIN) 5-325 MG per tablet 1 to 2 tabs every 4 to 6 hours as needed for pain.  20 tablet  0   No current facility-administered medications on file prior to visit.   Family History  Problem Relation Age of Onset  . Diabetes Father   . Hypertension Father    History   Social History  . Marital Status: Married    Spouse Name: N/A    Number of Children: N/A  . Years of Education: N/A   Occupational History  . Not on file.   Social History Main Topics  . Smoking status: Never Smoker   . Smokeless tobacco: Never Used  . Alcohol Use: No  . Drug Use: No  . Sexual Activity: No   Other Topics Concern  . Not on file   Social History Narrative  . No narrative on file    Review of Systems: Constitutional: Negative for fever, chills,  diaphoresis, activity change, appetite change and fatigue. HENT: Negative for ear pain, nosebleeds, congestion, facial swelling, rhinorrhea, neck pain, neck stiffness and ear discharge.  Eyes: Negative for pain, discharge, redness, itching and visual disturbance. Respiratory: Negative for cough, choking, chest tightness, shortness of breath, wheezing and stridor.  Cardiovascular: Negative for chest pain, palpitations and leg swelling. Gastrointestinal: Negative for abdominal distention. Genitourinary: Negative for dysuria, urgency, frequency, hematuria, flank pain, decreased urine volume, difficulty urinating and dyspareunia.  Musculoskeletal: Negative for back pain, joint swelling, arthralgia and gait problem. Neurological: Negative for dizziness, tremors, seizures, syncope, facial asymmetry, speech difficulty, weakness, light-headedness, numbness and headaches.  Hematological: Negative for adenopathy. Does not bruise/bleed easily. Psychiatric/Behavioral: Negative for hallucinations, behavioral problems, confusion, dysphoric mood, decreased concentration and agitation.    Objective:   Filed Vitals:   11/19/12 1423  BP: 149/78  Pulse: 67  Temp: 98.2 F (36.8 C)  Resp: 16    Physical Exam: Constitutional: Patient appears well-developed and well-nourished. No distress. HENT: Normocephalic, atraumatic, External right and left ear normal. Oropharynx is clear and moist.  Eyes: Conjunctivae and EOM are normal. PERRLA, no scleral icterus. Neck: Normal ROM. Neck supple. No JVD. No tracheal deviation. No thyromegaly. CVS: RRR, S1/S2 +, no murmurs, no gallops, no carotid bruit.  Pulmonary: Effort and breath sounds normal, no stridor, rhonchi, wheezes, rales.  Abdominal: Soft. BS +, no distension, tenderness, rebound  or guarding.  Musculoskeletal: Normal range of motion. No edema and no tenderness.  Lymphadenopathy: No lymphadenopathy noted, cervical, inguinal or axillary Neuro: Alert. Normal  reflexes, muscle tone coordination. No cranial nerve deficit. Skin: Skin is warm and dry. No rash noted. Not diaphoretic. No erythema. No pallor. Psychiatric: Normal mood and affect. Behavior, judgment, thought content normal.  Lab Results  Component Value Date   WBC 7.0 03/16/2012   HGB 13.9 03/16/2012   HCT 40.8 03/16/2012   MCV 78.2 03/16/2012   PLT 329 03/16/2012   Lab Results  Component Value Date   CREATININE 0.55 03/16/2012   BUN 8 03/16/2012   NA 139 03/16/2012   K 4.4 03/16/2012   CL 100 03/16/2012   CO2 29 03/16/2012    Lab Results  Component Value Date   HGBA1C 12.8 11/19/2012   Lipid Panel     Component Value Date/Time   CHOL 203* 03/19/2010 2036   TRIG 110 03/19/2010 2036   HDL 71 03/19/2010 2036   CHOLHDL 2.9 Ratio 03/19/2010 2036   VLDL 22 03/19/2010 2036   LDLCALC 110* 03/19/2010 2036       Assessment and plan:   Patient Active Problem List   Diagnosis Date Noted  . Diabetes 11/19/2012  . Essential hypertension, benign 11/19/2012  . Dyslipidemia 11/19/2012  . Osteoporosis, unspecified 11/19/2012  . ACUTE GINGIVITIS PLAQUE INDUCED 10/16/2009  . PLANTAR FASCIITIS, RIGHT 07/03/2009  . ABDOMINAL BRUIT 03/28/2009  . URINALYSIS, ABNORMAL 03/28/2009  . MICROALBUMINURIA 03/21/2009  . VARICOSE VEINS, LOWER EXTREMITIES 03/20/2009  . ABSCESS, SKIN 03/20/2009  . MUSCLE STRAIN, HAMSTRING MUSCLE 11/30/2007  . OSTEOPENIA 06/22/2007  . CYSTITIS 01/26/2007  . HEMATURIA UNSPECIFIED 01/26/2007  . HYPOTHYROIDISM 07/23/2006  . DIABETES MELLITUS, TYPE II 07/23/2006  . DIABETIC PERIPHERAL NEUROPATHY 07/23/2006  . HYPERLIPIDEMIA 07/23/2006  . HYPERTENSION 07/23/2006  . CEREBROVASCULAR DISEASE 07/23/2006  . MEDIAL EPICONDYLITIS, LEFT 07/23/2006  . TRANSIENT ISCHEMIC ATTACK, HX OF 07/23/2006    Plan: Refill all medications: Hypertension: Amlodipine 10 mg tablet by mouth daily Losartan/hydrochlorothiazide 320-25 mg tablet by mouth daily DASH diet Aspirin 81 mg tablet by mouth  daily  Hypercholesterolemia: Rosuvastatin 40 mg tablet by mouth daily  Diabetes mellitus: Pioglitazone 30 mg tablet by mouth daily Metformin 1000 mg tablet by mouth twice a day Glipizide 5 mg tablet by mouth daily  Hypothyroidism Levothyroxine 125 mcg tablet by mouth daily  Osteoporosis: Calcium 500-125 mg unit tablet by mouth daily Fosamax 70 mg tablet by mouth weekly  Labs: Comprehensive metabolic panel Complete blood count and differentials Thyroid function test Complete urinalysis Hemoglobin A1c Vitamin D level  Patient has been counseled extensively about nutrition and exercise Patient to schedule appointment for physical  Follow up in 4 weeks or when necessary  Interpreter was used to communicate directly with patient for the entire encounter including providing detailed patient instructions.   The patient was given clear instructions to go to ER or return to medical center if symptoms don't improve, worsen or new problems develop. The patient verbalized understanding. The patient was told to call to get lab results if they haven't heard anything in the next week.     Jeanann Lewandowsky, MD, MHA, FACP, FAAP [facility] [place], [place] [phone]   11/19/2012, 2:59 PM

## 2012-11-19 NOTE — Patient Instructions (Signed)
Hipotiroidismo (Hypothyroidism) La tiroides es una glndula grande ubicada en la parte anterior e inferior del cuello. La glndula tiroides interviene en el control del metabolismo. El metabolismo es el modo en que el organismo utiliza los alimentos. El control del metabolismo se realiza a travs de una hormona denominada tiroxina. Cuando la actividad de esta glndula est por debajo de lo normal (hipotiroidismo) produce muy poca cantidad de hormona. CAUSAS Aqu se incluyen:   Ausencia de tejido tiroideo.  Bocio por dficit de yodo.  Bocio por medicamentos.  Defectos congnitos (desde el nacimiento).  Trastornos de la glndula pituitaria Esto ocasiona la falta de TSH (sigla que significa hormona estimulante de la tiroides) Esta hormona le informa a la tiroides que debe producir ms hormona. SNTOMAS  Letargia (sentir que no se tiene Engineer, drilling)  Intolerancia al fro  [employer] (a pesar de una ingesta normal de alimentos)  Piel seca  [place] seco  Irregularidades menstruales  Enlentecimiento de los procesos de pensamiento La insuficiente cantidad de hormona tiroidea tambin puede ocasionar problemas cardacos. El hipotiroidismo en el recin nacido es el cretinismo en su forma extrema. Es importante que esta forma se trate de modo adecuado e inmediato, ya que puede conducir rpidamente al retardo del desarrollo fsico y mental. DIAGNSTICO Para comprobar la existencia de hipotiroidismo, Hydrographic surveyor anlisis de sangre y radiografas y estudios con [place]. Muchas veces los signos estn ocultos. Es necesario que el profesional vigile la enfermedad con anlisis de [place]. Esto se realiza luego de Actor diagnstico (determinar cul es el problema). Puede ser necesario que el profesional que lo asiste controle esta enfermedad con anlisis de sangre ya sea antes o despus del diagnstico y [place]. TRATAMIENTO Los niveles bajos de hormona tiroidea se  incrementan con el uso de hormona tiroidea sinttica. Este es un tratamiento seguro y [place]. Se dispone de hormona tiroidea sinttica para el tratamiento de este trastorno. Generalmente lleva algunas semanas obtener el efecto total de los medicamentos. Luego de obtener el efecto completo del [place], habitualmente pasan otras cuatro semanas para que los sntomas empiezan a Geneticist, molecular. El profesional podr comenzar indicndole dosis bajas. Si usted tuvo problemas cardacos, la dosis se aumentar de manera gradual. Podr volver a lo normal sin Marketing executive una situacin de emergencia. INSTRUCCIONES PARA EL CUIDADO DOMICILIARIO  Tome los [employer]ée [employer] ha indicado el profesional que lo asiste. Infrmele al profesional todos los medicamentos que toma o que ha comenzado a Careers adviser. El profesional que lo asiste lo ayudar con los esquemas de las dosis.  A medida que obtiene mejora, es necesario aumentar la dosis. Ser necesario Education officer, environmental continuos anlisis de [place], segn lo indique el profesional.  Informe acerca de todos los efectos secundarios que sospeche que podran deberse a los medicamentos. SOLICITE ATENCIN MDICA SI: Solicite atencin mdica si observa:  Sudoracin.  Temblores.  Ansiedad.  Rpida prdida de peso.  Intolerancia al calor.  Cambios emocionales.  Diarrea.  Debilidad. SOLICITE ATENCIN MDICA DE INMEDIATO SI: Presenta dolor en el pecho, una frecuencia cardaca irregular (palpitaciones) o latidos cardacos rpidos. EST SEGURO QUE:   Comprende las instrucciones para el alta mdica.  Controlar su enfermedad.  Solicitar atencin mdica de inmediato segn las indicaciones. Document Released: 12/31/2004 Document Revised: 03/25/2011 [facility] Patient Information 2014 [place], [place]. Diabetes y Doroteo Glassman fsica (Diabetes and Exercise) Hacer actividad fsica con regularidad es muy importante. No se trata slo de perder peso. Tiene muchos otros beneficios,  como por ejemplo:  Mejorar el [place],  flexibilidad y resistencia.  Aumenta la densidad sea.  Ayuda a Art gallery manager.  Disminuye la Art gallery manager.  Aumenta la fuerza muscular.  Reduce el estrs y las tensiones.  Mejora el estado de salud general. Las personas diabticas que realizan actividad fsica tienen beneficios adicionales debido al ejercicio:  Reduce el apetito.  Mejora la utilizacin del azcar (glucosa) por parte del organismo.  Ayuda a disminuir o Information systems manager.  Disminuye la presin arterial.  Ayuda a disminuir los lpidos en sangre (colesterol y triglicridos).  Mejora el uso de la insulina por parte del organismo porque:  Aumenta la sensibilidad del organismo a la insulina.  Reduce las necesidades de insulina del organismo.  Disminuye el riesgo de enfermedad cardaca por la actividad fsica.  Disminuye el colesterol y los triglicridos.  Aumenta los niveles de colesterol bueno (como las lipoprotenas de alta densidad [HDL]) en el organismo.  Disminuye los niveles de glucosa en [place]. SU PLAN DE ACTIVIDAD  Elija una actividad que disfrute y establezca objetivos realistas. Su mdico o educador en diabetes podrn ayudarlo a Tax adviser que lo beneficie. Podr dividir las actividades en 2 o 3 sesiones a lo Paediatric nurse. Hacer esto es tan bueno como hacer una sesin prolongada. Las ideas para los ejercicios incluyen:  Lleve a Multimedia programmer.  Utilice las Microbiologist del ascensor.  Baile su cancin favorita.  Haga sus ejercicios favoritos con Leisure centre manager. RECOMENDACIONES PARA REALIZAR EJERCICIOS CUANDO SE TIENE DIABETES TIPO 1 O TIPO 2   Controle la glucosa en sangre antes de comenzar. Si el nivel de glucosa en sangre es de ms de 240 mg/dl, controle las cetonas en la [place]. Si hay cetonas, no realice actividad fsica.  Evite inyectarse insulina en las zonas del cuerpo que ejercitar. Por ejemplo, evite  inyectarse insulina en:  Los brazos, si juega al tenis.  Las piernas, si corre.  Lleve un registro de:  Alimentos que consume antes y despus de Tour manager.  Los momentos esperables de picos de accin de la insulina.  Los niveles de glucosa en sangre antes y despus de hacer ejercicios.  El tipo y cantidad de [place].  Revise los registros con su mdico. El mdico lo ayudar a Environmental education officer pautas para ajustar la cantidad de alimento y las cantidades de insulina antes y despus de Radio producer ejercicios.  Si toma insulina o agentes hipoglucemiantes por va oral, observe si hay signos y sntomas de hipoglucemia. Ellos son:  Mareos.  Temblores.  Sudoracin.  Escalofros.  Confusin.  Beba gran cantidad de agua mientras hace ejercicios para evitar la deshidratacin o el infarto. Durante la actividad fsica se pierde Data processing manager.  Comente con su mdico antes de comenzar un programa de actividad fsica para verificar que sea seguro para usted. Recuerde, cualquier actividad es mejor que ninguna. Document Released: 01/20/2007 Document Revised: 09/02/2012 [facility] Patient Information 2014 [place], [place]. Hipertensin (Hypertension) Cuando el corazn late [employer] sangre a travs de las arterias. La fuerza que se origina es la presin arterial. Si la presin es demasiado elevada, se denomina hipertensin. El peligro radica en que puede sufrirla y no saberlo. Hipertensin puede significar que su corazn debe trabajar ms intensamente para bombear sangre. Las arterias pueden Dietitian o rgidas. El [place] extra [place] el riesgo de enfermedades cardacas, ictus y [employer].  La presin arterial est formada por dos nmeros: el nmero mayor sobre el nmero menor, por ejemplo110/70. Se seala "110/72". Los Reliant Energy  son por debajo de 120 para el nmero ms alto (sistlica) y por debajo de 80 para el ms bajo (diastlica). Anote su presin sangunea hoy. Debe  prestar mucha atencin a su presin arterial si sufre alguna otra enfermedad como:  Insuficiencia cardaca  Ataques cardiacos previos  Diabetes  Enfermedad renal crnica  Ictus previo  Mltiples factores de riesgo para enfermedades cardacas Para diagnosticar si usted sufre hipertensin arterial, debe medirse la presin mientras encuentra sentado con el brazo elevado a la altura del nivel del corazn. Debe medirse al menos 2 veces. Una nica lectura de presin arterial elevada (especialmente en el servicio de emergencias) no significa que necesita tratamiento. Hay enfermedades en las que la presin arterial es diferente en ambos brazos. Es importante que consulte rpidamente con su mdico para un control. La Harley[employer]Davidson de las personas sufren hipertensin esencial, lo que significa que no tiene una causa especfica. Este tipo de hipertensin puede bajarse modificando algunos factores en el estilo de vida como:  Librarian, academic.  El consumo de cigarrillos.  La falta de actividad fsica.  Peso excesivo  Consumo de drogas y alcohol.  Consumiendo menos sal La mayora de las personas no tienen sntomas hasta que la hipertensin ocasiona un dao en el organismo. El tratamiento efectivo puede evitar, Designer, industrial/product o reducir ese dao. TRATAMIENTO: El tratamiento para la hipertensin, cuando se ha identificado una causa, est dirigido a la misma Hay un gran nmero en medicamentos para tratarla. Se agrupan en diferentes categoras y Media planner los medicamentos indicados para usted. Muchos medicamentos disponibles tienen efectos secundarios. Debe comentar los efectos secundarios con su mdico. Si la presin arterial permanece elevada despus de modificar su estilo de vida o comenzar a tomar medicamentos:  Los medicamentos deben ser reemplazados  Puede ser necesario evaluar otros problemas.  Debe estar seguro que comprende las indicaciones, que sabe cmo y cundo [employer].  Asegrese  de Education officer, environmental un control con su mdico dentro del tiempo indicado (generalmente dentro de las Marsh & McLennan) para volver a Systems analyst presin arterial y Dentist los medicamentos prescritos.  Si est tomando ms de un medicamento para la presin arterial, asegrese que sabe cmo y en qu momentos debe tomarlos. Tomar los medicamentos al mismo tiempo puede dar como resultado un gran descenso en la presin arterial. SOLICITE ATENCIN MDICA DE INMEDIATO SI PRESENTA:  Dolor de cabeza intenso, visin borrosa o cambios en la visin, o confusin.  Debilidad o adormecimientos inusuales o sensacin de desmayo.  Dolor de pecho o abdominal intenso, vmitos o problemas para respirar. ASEGRESE QUE:   Comprende estas instrucciones.  Controlar su enfermedad.  Solicitar ayuda inmediatamente si no mejora o si empeora. Document Released: 12/31/2004 Document Revised: 03/25/2011 [facility] Patient Information 2014 [place], [place]. Osteoporosis  (Osteoporosis)  A lo largo de la vida, el cuerpo elimina el tejido viejo de los [place] y lo reemplaza por tejido nuevo. A medida que se envejece, el cuerpo no puede reponerlo tan rpidamente como lo elimina. Alrededor Safeco Corporation 30 aos, la mayora de las personas comienza a perder poco a poco el tejido de los huesos debido al desequilibrio entre la prdida y la reposicin. Algunas personas pierden ms tejido Fisher [employer]. La prdida del tejido del hueso ms all de un grado normal se considera osteoporosis.  La osteoporosis afecta la resistencia y la durabilidad de los [place]. El interior de los extremos de los huesos y los huesos planos, como los huesos de la pelvis, se parecen a un panal porque estn  llenos de pequeos espacios abiertos. Al perder tejido los huesos se vuelven menos densos. Esto significa que los espacios abiertos se hacen ms grandes y las paredes entre estos espacios se vuelven ms delgadas. Como consecuencia, los huesos se vuelven ms dbiles. Los huesos de  una persona que sufre osteoporosis llegan a ser tan dbiles que pueden romperse (fractura) en un accidente leve, como una simple cada.  CAUSAS  Los siguientes factores han sido asociados con el desarrollo de la osteoporosis.   El hbito de fumar.  Beber ms de 2 medidas de bebidas [employer]el[employer] Corporation a la [place].  Uso de ciertos medicamentos durante un tiempo prolongado.  Corticoides.  Medicamentos para la quimioterapia.  Medicamentos para la tiroides.  Medicamentos antiepilpticos.  Medicamentos de supresin gonadal.  Medicamentos inmunosupresores.  Tener bajo peso.  Falta de actividad fsica.  Falta de exposicin al sol. Esto puede ser la causa del dficit de vitamina D.  Ciertas enfermedades crnicas.  Ciertas enfermedades inflamatorias del intestino, como la enfermedad de Crohn y la colitis ulcerosa.  Diabetes.  Hipertiroidismo.  Hiperparatiroidismo. FACTORES DE RIESGO  Cualquier persona puede desarrollar osteoporosis. Sin embargo, los siguientes factores pueden aumentar el riesgo de Environmental education officer osteoporosis.   [place] - La mujeres tienen ms [employer] International.  Edad - Tener ms de 50 aos aumenta el riesgo.  [place] - Las personas de raza blanca y asitica tienen ms riesgo.  El peso - Tener un peso extremadamente bajo puede aumentar el riesgo de osteoporosis.  Historia familiar de osteoporosis - Tener un miembro en la familia que haya sufrido osteoporosis hace que aumente el [place]. SNTOMAS  Generalmente las personas que sufren osteoporosis no tienen sntomas.  DIAGNSTICO  Algunos signos hallados durante un examen fsico que pueden hacer sospechar al mdico que sufre osteoporosis son:   Disminucin de Print production planner. La causa en general es la compresin de los huesos que forman la columna vertebral (vrtebras), que se han debilitado y se han fracturado.  Una curva o redondeo de la espalda (cifosis). Para confirmar los signos de osteoporosis, el  mdico puede solicitar un procedimiento que [place] 2 haces de rayos X en dosis bajas, con diferentes niveles de energa para medir la densidad mineral sea (absorciometra de rayos X de energa dual [DXA]). Adems, el mdico controlar su nivel de vitamina D.  TRATAMIENTO  El objetivo del tratamiento de la osteoporosis es el fortalecimiento de los huesos con el fin de disminuir el riesgo de fracturas. Hay diferentes tipos de medicamentos disponibles para ayudar a Armed forces technical officer. Algunos de estos medicamentos actan reduciendo [employer] de prdida sea. Otros medicamentos funcionan aumentando la densidad sea. El tratamiento tambin consiste en asegurarse de que sus niveles de calcio y vitamina D son adecuados.  PREVENCIN  Hay cosas que usted puede hacer para prevenir la osteoporosis. Un consumo adecuado de calcio y vitamina D puede ayudar a Personnel officer una ptima densidad mineral sea. El ejercicio regular tambin puede ayudar, sobre todo la resistencia y actividades de alto impacto. Si usted fuma, dejar de fumar es una parte importante de la prevencin de la osteoporosis.  ASEGRESE DE QUE:   Comprende estas instrucciones.  Controlar su enfermedad.  Solicitar ayuda de inmediato si no mejora o si empeora. PARA OBTENER MS INFORMACIN  [email or web address] and [email or web address]  Document Released: 10/10/2004 Document Revised: 04/27/2012 [facility] Patient Information 2014 [place], [place].

## 2012-11-20 LAB — URINALYSIS, COMPLETE
Bacteria, UA: NONE SEEN
Bilirubin Urine: NEGATIVE
Ketones, ur: NEGATIVE mg/dL
Nitrite: NEGATIVE
Protein, ur: NEGATIVE mg/dL
Specific Gravity, Urine: 1.013 (ref 1.005–1.030)
Urobilinogen, UA: 0.2 mg/dL (ref 0.0–1.0)

## 2012-12-24 ENCOUNTER — Ambulatory Visit: Payer: Self-pay

## 2013-04-26 ENCOUNTER — Encounter: Payer: Self-pay | Admitting: Internal Medicine

## 2013-04-26 ENCOUNTER — Ambulatory Visit: Payer: Self-pay | Attending: Internal Medicine | Admitting: Internal Medicine

## 2013-04-26 VITALS — BP 157/77 | HR 76 | Temp 98.2°F | Resp 16 | Ht <= 58 in | Wt 149.0 lb

## 2013-04-26 DIAGNOSIS — M810 Age-related osteoporosis without current pathological fracture: Secondary | ICD-10-CM | POA: Insufficient documentation

## 2013-04-26 DIAGNOSIS — I10 Essential (primary) hypertension: Secondary | ICD-10-CM | POA: Insufficient documentation

## 2013-04-26 DIAGNOSIS — N898 Other specified noninflammatory disorders of vagina: Secondary | ICD-10-CM | POA: Insufficient documentation

## 2013-04-26 DIAGNOSIS — E785 Hyperlipidemia, unspecified: Secondary | ICD-10-CM | POA: Insufficient documentation

## 2013-04-26 DIAGNOSIS — E119 Type 2 diabetes mellitus without complications: Secondary | ICD-10-CM | POA: Insufficient documentation

## 2013-04-26 DIAGNOSIS — E039 Hypothyroidism, unspecified: Secondary | ICD-10-CM | POA: Insufficient documentation

## 2013-04-26 LAB — BASIC METABOLIC PANEL
BUN: 10 mg/dL (ref 6–23)
CALCIUM: 9.5 mg/dL (ref 8.4–10.5)
CO2: 29 mEq/L (ref 19–32)
Chloride: 97 mEq/L (ref 96–112)
Creat: 0.61 mg/dL (ref 0.50–1.10)
GLUCOSE: 251 mg/dL — AB (ref 70–99)
Potassium: 3.8 mEq/L (ref 3.5–5.3)
SODIUM: 133 meq/L — AB (ref 135–145)

## 2013-04-26 LAB — LIPID PANEL
CHOL/HDL RATIO: 5 ratio
CHOLESTEROL: 301 mg/dL — AB (ref 0–200)
HDL: 60 mg/dL (ref >39)
LDL Cholesterol: 186 mg/dL — ABNORMAL HIGH (ref 0–99)
Triglycerides: 274 mg/dL — ABNORMAL HIGH (ref <150)
VLDL: 55 mg/dL — ABNORMAL HIGH (ref 0–40)

## 2013-04-26 LAB — GLUCOSE, POCT (MANUAL RESULT ENTRY): POC GLUCOSE: 278 mg/dL — AB (ref 70–99)

## 2013-04-26 LAB — POCT GLYCOSYLATED HEMOGLOBIN (HGB A1C): Hemoglobin A1C: 14

## 2013-04-26 MED ORDER — METFORMIN HCL 1000 MG PO TABS: 1000.0000 mg | ORAL_TABLET | Freq: Two times a day (BID) | ORAL | Status: DC

## 2013-04-26 MED ORDER — AMLODIPINE BESYLATE 10 MG PO TABS: 10.0000 mg | ORAL_TABLET | Freq: Every day | ORAL | Status: DC

## 2013-04-26 MED ORDER — GLIPIZIDE ER 10 MG PO TB24: 10.0000 mg | ORAL_TABLET | Freq: Every day | ORAL | Status: DC

## 2013-04-26 MED ORDER — LEVOTHYROXINE SODIUM 125 MCG PO TABS: 125.0000 ug | ORAL_TABLET | Freq: Every day | ORAL | Status: DC

## 2013-04-26 MED ORDER — VALSARTAN-HYDROCHLOROTHIAZIDE 320-25 MG PO TABS: 1.0000 | ORAL_TABLET | Freq: Every day | ORAL | Status: DC

## 2013-04-26 MED ORDER — BENZOCAINE-RESORCINOL 5-2 % VA CREA: TOPICAL_CREAM | Freq: Every day | VAGINAL | Status: DC

## 2013-04-26 MED ORDER — INSULIN ASPART 100 UNIT/ML ~~LOC~~ SOLN
10.0000 [IU] | Freq: Once | SUBCUTANEOUS | Status: AC
  Administered 2013-04-26: 10 [IU] via SUBCUTANEOUS

## 2013-04-26 MED ORDER — FLUCONAZOLE 150 MG PO TABS: 150.0000 mg | ORAL_TABLET | Freq: Once | ORAL | Status: DC

## 2013-04-26 MED ORDER — PIOGLITAZONE HCL 45 MG PO TABS: 45.0000 mg | ORAL_TABLET | Freq: Every day | ORAL | Status: DC

## 2013-04-26 MED ORDER — ALENDRONATE SODIUM 70 MG PO TABS: 70.0000 mg | ORAL_TABLET | ORAL | Status: DC

## 2013-04-26 MED ORDER — ROSUVASTATIN CALCIUM 40 MG PO TABS: 40.0000 mg | ORAL_TABLET | Freq: Every day | ORAL | Status: DC

## 2013-04-26 NOTE — Patient Instructions (Signed)
Hipertensin (Hypertension) Cuando el corazn late [facility] sangre a travs de las arterias. La fuerza que se origina es la presin arterial. Si la presin es demasiado elevada, se denomina hipertensin. El peligro radica en que puede sufrirla y no saberlo. Hipertensin puede significar que su corazn debe trabajar ms intensamente para bombear sangre. Las arterias pueden Insurance risk surveyor o rgidas. [place] extra [place] el riesgo de enfermedades cardacas, ictus y [employer].  La presin arterial est formada por dos nmeros: el nmero mayor sobre el nmero menor, por ejemplo110/70. Se seala "110/72". Los valores ideales son por debajo de 120 para el nmero ms alto (sistlica) y por debajo de 80 para el ms bajo (diastlica). Anote su presin sangunea hoy. Debe prestar mucha atencin a su presin arterial si sufre alguna otra enfermedad como:  Insuficiencia cardaca  Ataques cardiacos previos  Diabetes  Enfermedad renal crnica  Ictus previo  Mltiples factores de riesgo para enfermedades cardacas Para diagnosticar si usted sufre hipertensin arterial, debe medirse la presin mientras encuentra sentado con el brazo elevado a la altura del nivel del corazn. Debe medirse al menos 2 veces. Una nica lectura de presin arterial elevada (especialmente en el servicio de emergencias) no significa que necesita tratamiento. Hay enfermedades en las que la presin arterial es diferente en ambos brazos. Es importante que consulte rpidamente con su mdico para un control. La [employer] de las personas sufren hipertensin esencial, lo que significa que no tiene una causa especfica. Este tipo de hipertensin puede bajarse modificando algunos factores en el estilo de vida como:  Psychologist, forensic.  El consumo de cigarrillos.  La falta de actividad fsica.  Peso excesivo  Consumo de drogas y alcohol.  Consumiendo menos sal La mayora de las personas no tienen sntomas hasta que la hipertensin  ocasiona un dao en el organismo. El tratamiento efectivo puede evitar, Network engineer o reducir ese dao. TRATAMIENTO: El tratamiento para la hipertensin, cuando se ha identificado una causa, est dirigido a la misma Hay un gran nmero en medicamentos para tratarla. Se agrupan en diferentes categoras y Office manager los medicamentos indicados para usted. Muchos medicamentos disponibles tienen efectos secundarios. Debe comentar los efectos secundarios con su mdico. Si la presin arterial permanece elevada despus de modificar su estilo de vida o comenzar a tomar medicamentos:  Los medicamentos deben ser reemplazados  Puede ser necesario evaluar otros problemas.  Debe estar seguro que comprende las indicaciones, que sabe cmo y cundo [employer].  Asegrese de Optometrist un control con su mdico dentro del tiempo indicado (generalmente dentro de las [employer]) para volver a Mudlogger presin arterial y Health visitor los medicamentos prescritos.  Si est tomando ms de un medicamento para la presin arterial, asegrese que sabe cmo y en qu momentos debe tomarlos. Tomar los medicamentos al mismo tiempo puede dar como resultado un gran descenso en la presin arterial. SOLICITE ATENCIN MDICA DE INMEDIATO SI PRESENTA:  Dolor de cabeza intenso, visin borrosa o cambios en la visin, o confusin.  Debilidad o adormecimientos inusuales o sensacin de desmayo.  Dolor de pecho o abdominal intenso, vmitos o problemas para respirar. ASEGRESE QUE:   Comprende estas instrucciones.  Controlar su enfermedad.  Solicitar ayuda inmediatamente si no mejora o si empeora. Document Released: 12/31/2004 Document Revised: 03/25/2011 [facility] Patient Information 2014 [place], [place]. Diabetes y Kandace Blitz fsica (Diabetes and Exercise) Hacer actividad fsica con regularidad es muy importante. No se trata slo de perder peso. Tiene muchos otros beneficios, como por ejemplo:  Mejorar el  estado de [place], flexibilidad y resistencia.  Aumenta la densidad sea.  Ayuda a Technical sales engineer.  Disminuye la Air traffic controller.  Aumenta la fuerza muscular.  Reduce el estrs y las tensiones.  Mejora el estado de salud general. Las personas diabticas que realizan actividad fsica tienen beneficios adicionales debido al ejercicio:  Reduce el apetito.  Mejora la utilizacin del azcar (glucosa) por parte del organismo.  Ayuda a disminuir o Special educational needs teacher.  Disminuye la presin arterial.  Ayuda a disminuir los lpidos en sangre (colesterol y triglicridos).  Mejora el uso de la insulina por parte del organismo porque:  Aumenta la sensibilidad del organismo a la insulina.  Reduce las necesidades de insulina del organismo.  Disminuye el riesgo de enfermedad cardaca por la actividad fsica.  [place] colesterol y los triglicridos.  Aumenta los niveles de colesterol bueno (como las lipoprotenas de alta densidad [HDL]) en el organismo.  Disminuye los niveles de glucosa en [place]. SU PLAN DE ACTIVIDAD  Elija una actividad que disfrute y establezca objetivos realistas. Su mdico o educador en diabetes podrn ayudarlo a Conservation officer, nature que lo beneficie. Podr dividir las actividades en 2 o 3 sesiones a lo Teacher, early years/pre. Hacer esto es tan bueno como hacer una sesin prolongada. Las ideas para los ejercicios incluyen:  Lleve a Probation officer.  Utilice las Clinical cytogeneticist del ascensor.  Baile su cancin favorita.  Haga sus ejercicios favoritos con [place]. RECOMENDACIONES PARA REALIZAR EJERCICIOS CUANDO SE TIENE DIABETES TIPO 1 O TIPO 2   Controle la glucosa en sangre antes de comenzar. Si el nivel de glucosa en sangre es de ms de 240 mg/dl, controle las cetonas en la [place]. Si hay cetonas, no realice actividad fsica.  Evite inyectarse insulina en las zonas del cuerpo que ejercitar. Por ejemplo, evite inyectarse insulina en:  Los  brazos, si juega al tenis.  Las piernas, si corre.  Lleve un registro de:  Alimentos que consume antes y despus de TEFL teacher.  Los momentos esperables de picos de accin de la insulina.  Los niveles de glucosa en sangre antes y despus de hacer [place].  El tipo y cantidad de [place]  Revise los registros con su mdico. El mdico lo ayudar a Actor pautas para ajustar la cantidad de alimento y las cantidades de insulina antes y despus de Field seismologist ejercicios.  Si toma insulina o agentes hipoglucemiantes por va oral, observe si hay signos y sntomas de hipoglucemia. Ellos son:  Mareos.  Temblores.  Sudoracin.  Escalofros.  Confusin.  Beba gran cantidad de agua mientras hace ejercicios para evitar la deshidratacin o el infarto. Durante la actividad fsica se pierde Chiropodist.  Comente con su mdico antes de comenzar un programa de actividad fsica para verificar que sea seguro para usted. Recuerde, cualquier actividad es mejor que ninguna. Document Released: 01/20/2007 Document Revised: 09/02/2012 [facility] Patient Information 2014 [place], [place].

## 2013-04-26 NOTE — Progress Notes (Signed)
Pt is here following up on her HTN and diabetes.  Pt's daughter is interpreting for pt.

## 2013-04-27 LAB — CERVICOVAGINAL ANCILLARY ONLY
Chlamydia: NEGATIVE
Neisseria Gonorrhea: NEGATIVE
WET PREP (BD AFFIRM): NEGATIVE
Wet Prep (BD Affirm): NEGATIVE
Wet Prep (BD Affirm): NEGATIVE

## 2013-04-27 LAB — TSH: TSH: 5.664 u[IU]/mL — ABNORMAL HIGH (ref 0.350–4.500)

## 2013-04-27 LAB — T4, FREE: Free T4: 1.01 ng/dL (ref 0.80–1.80)

## 2013-04-30 NOTE — Progress Notes (Signed)
Patient ID: Linda House, female   DOB: Sep 28, 1945, 68 y.o.   MRN: [id number]   Linda House, is a 68 y.o. female  [id number]  [id number]  DOB - 1945-03-30  Chief Complaint  Patient presents with  . Follow-up        Subjective:   Linda House is a 68 y.o. female here today for a follow up visit. She has history of hypertension and diabetes, dyslipidemia, osteoporosis, previous TIA, hypothyroidism. She is on medications as listed below. Pt is requesting a refill of her medications. Her major complaint today is vaginal discharge and itching. She has run out of all her medications for some times now, patient was lost to followup since November last year. She's not sexually active. Patient has No headache, No chest pain, No abdominal pain - No Nausea, No new weakness tingling or numbness, No Cough - SOB.  No problems updated.  ALLERGIES: No Known Allergies  PAST MEDICAL HISTORY: Past Medical History  Diagnosis Date  . Diabetes mellitus   . Hypertension     MEDICATIONS AT HOME: Prior to Admission medications   Medication Sig Start Date End Date Taking? Authorizing Provider  alendronate (FOSAMAX) 70 MG tablet Take 1 tablet (70 mg total) by mouth every 7 (seven) days. Take with a full glass of water on an empty stomach. 04/26/13  Yes Angelica Chessman, MD  amLODipine (NORVASC) 10 MG tablet Take 1 tablet (10 mg total) by mouth daily. 04/26/13  Yes Angelica Chessman, MD  aspirin EC 81 MG tablet Take 1 tablet (81 mg total) by mouth daily. 11/19/12  Yes Angelica Chessman, MD  Calcium 500-125 MG-UNIT TABS Take 1 tablet by mouth daily. 11/19/12  Yes Angelica Chessman, MD  fluconazole (DIFLUCAN) 150 MG tablet Take 1 tablet (150 mg total) by mouth once. 04/26/13  Yes Angelica Chessman, MD  glipiZIDE (GLUCOTROL XL) 10 MG 24 hr tablet Take 1 tablet (10 mg total) by mouth daily. 04/26/13  Yes Angelica Chessman, MD  levothyroxine (SYNTHROID, LEVOTHROID)  125 MCG tablet Take 1 tablet (125 mcg total) by mouth daily. 04/26/13  Yes Angelica Chessman, MD  metFORMIN (GLUCOPHAGE) 1000 MG tablet Take 1 tablet (1,000 mg total) by mouth 2 (two) times daily with a meal. 04/26/13  Yes Angelica Chessman, MD  pioglitazone (ACTOS) 45 MG tablet Take 1 tablet (45 mg total) by mouth daily. 04/26/13  Yes Angelica Chessman, MD  rosuvastatin (CRESTOR) 40 MG tablet Take 1 tablet (40 mg total) by mouth daily. 04/26/13  Yes Angelica Chessman, MD  valsartan-hydrochlorothiazide (DIOVAN-HCT) 320-25 MG per tablet Take 1 tablet by mouth daily. 04/26/13  Yes Angelica Chessman, MD  benzocaine-resorcinol (VAGISIL) 5-2 % vaginal cream Place vaginally at bedtime. 04/26/13   Angelica Chessman, MD  cephALEXin (KEFLEX) 500 MG capsule Take 1 capsule (500 mg total) by mouth 3 (three) times daily. 05/23/12   Harden Mo, MD  HYDROcodone-acetaminophen (NORCO/VICODIN) 5-325 MG per tablet 1 to 2 tabs every 4 to 6 hours as needed for pain. 05/23/12   Harden Mo, MD     Objective:   Filed Vitals:   04/26/13 1500  BP: 157/77  Pulse: 76  Temp: 98.2 F (36.8 C)  TempSrc: Oral  Resp: 16  Height: 4\' 10"  (1.473 m)  Weight: 149 lb (67.586 kg)  SpO2: 96%    Exam General appearance : Awake, alert, not in any distress. Speech Clear. Not toxic looking HEENT: Atraumatic and Normocephalic, pupils equally reactive to light and accomodation Neck: supple, no JVD.  No cervical lymphadenopathy.  Chest:Good air entry bilaterally, no added sounds  CVS: S1 S2 regular, no murmurs.  Abdomen: Bowel sounds present, Non tender and not distended with no gaurding, rigidity or rebound. Extremities: B/L Lower Ext shows no edema, both legs are warm to touch Neurology: Awake alert, and oriented X 3, CN II-XII intact, Non focal Skin:No Rash Wounds:N/A Pelvic examination: Normal female external genitalia, excoriated vulva, no obvious discharge, central cervix, negative cervical motion tenderness, no contact  bleeding. Data Review Lab Results  Component Value Date   HGBA1C 14 04/26/2013   HGBA1C 12.8 11/19/2012   HGBA1C 10.6* 03/16/2012     Assessment & Plan   1. Diabetes: Uncontrolled due to noncompliance with medications  - Glucose (CBG)  - HgB A1c as jumped to 14% - insulin aspart (novoLOG) injection 10 Units; Inject 0.1 mLs (10 Units total) into the skin once. Refill - metFORMIN (GLUCOPHAGE) 1000 MG tablet; Take 1 tablet (1,000 mg total) by mouth 2 (two) times daily with a meal.  Dispense: 180 tablet; Refill: 3 - pioglitazone (ACTOS) 45 MG tablet; Take 1 tablet (45 mg total) by mouth daily.  Dispense: 90 tablet; Refill: 3 Increased glipizide to 10 mg from 5 - glipiZIDE (GLUCOTROL XL) 10 MG 24 hr tablet; Take 1 tablet (10 mg total) by mouth daily.  Dispense: 90 tablet; Refill: 3  2. Essential hypertension, benign  - Basic Metabolic Panel Refill - valsartan-hydrochlorothiazide (DIOVAN-HCT) 320-25 MG per tablet; Take 1 tablet by mouth daily.  Dispense: 90 tablet; Refill: 3 - amLODipine (NORVASC) 10 MG tablet; Take 1 tablet (10 mg total) by mouth daily.  Dispense: 90 tablet; Refill: 3  3. Unspecified hypothyroidism Refill - levothyroxine (SYNTHROID, LEVOTHROID) 125 MCG tablet; Take 1 tablet (125 mcg total) by mouth daily.  Dispense: 90 tablet; Refill: 3  - TSH - T4, Free  4. Dyslipidemia  - Lipid Panel Refill - rosuvastatin (CRESTOR) 40 MG tablet; Take 1 tablet (40 mg total) by mouth daily.  Dispense: 90 tablet; Refill: 3  5. Vaginal discharge Vagina swab for wet mount and culture - Cervicovaginal ancillary only  Empiric treatment for vaginal candidiasis - fluconazole (DIFLUCAN) 150 MG tablet; Take 1 tablet (150 mg total) by mouth once.  Dispense: 1 tablet; Refill: 1 - benzocaine-resorcinol (VAGISIL) 5-2 % vaginal cream; Place vaginally at bedtime.  Dispense: 60 g; Refill: 1  6. Osteoporosis, unspecified Refill - alendronate (FOSAMAX) 70 MG tablet; Take 1 tablet (70 mg  total) by mouth every 7 (seven) days. Take with a full glass of water on an empty stomach.  Dispense: 12 tablet; Refill: 3  Patient was extensively counseled on nutrition and exercise Patient was counseled extensively about medication compliance We discussed blood pressure and blood sugar goals today and he needs to control both to avoid complications  Return in about 2 weeks (around 05/10/2013), or if symptoms worsen or fail to improve, for Diabetic Management/[place].  The patient was given clear instructions to go to ER or return to medical center if symptoms don't improve, worsen or new problems develop. The patient verbalized understanding. The patient was told to call to get lab results if they haven't heard anything in the next week.   This note has been created with Surveyor, quantity. Any transcriptional errors are unintentional.    Angelica Chessman, MD, [place], [place], [place] and [facility] [place], [place]   04/30/2013, 2:15 PM

## 2013-05-11 ENCOUNTER — Ambulatory Visit: Payer: Self-pay | Admitting: Pharmacist

## 2013-07-26 ENCOUNTER — Ambulatory Visit: Payer: No Typology Code available for payment source | Attending: Internal Medicine | Admitting: Internal Medicine

## 2013-07-26 ENCOUNTER — Encounter: Payer: Self-pay | Admitting: Internal Medicine

## 2013-07-26 VITALS — BP 121/73 | HR 95 | Temp 98.2°F | Resp 16 | Ht <= 58 in | Wt 150.0 lb

## 2013-07-26 DIAGNOSIS — M1712 Unilateral primary osteoarthritis, left knee: Secondary | ICD-10-CM | POA: Insufficient documentation

## 2013-07-26 DIAGNOSIS — E039 Hypothyroidism, unspecified: Secondary | ICD-10-CM | POA: Insufficient documentation

## 2013-07-26 DIAGNOSIS — IMO0002 Reserved for concepts with insufficient information to code with codable children: Secondary | ICD-10-CM

## 2013-07-26 DIAGNOSIS — Z7982 Long term (current) use of aspirin: Secondary | ICD-10-CM | POA: Insufficient documentation

## 2013-07-26 DIAGNOSIS — E139 Other specified diabetes mellitus without complications: Secondary | ICD-10-CM

## 2013-07-26 DIAGNOSIS — E089 Diabetes mellitus due to underlying condition without complications: Secondary | ICD-10-CM

## 2013-07-26 DIAGNOSIS — E1165 Type 2 diabetes mellitus with hyperglycemia: Secondary | ICD-10-CM

## 2013-07-26 DIAGNOSIS — IMO0001 Reserved for inherently not codable concepts without codable children: Secondary | ICD-10-CM | POA: Insufficient documentation

## 2013-07-26 DIAGNOSIS — I10 Essential (primary) hypertension: Secondary | ICD-10-CM | POA: Insufficient documentation

## 2013-07-26 DIAGNOSIS — E119 Type 2 diabetes mellitus without complications: Secondary | ICD-10-CM | POA: Insufficient documentation

## 2013-07-26 DIAGNOSIS — E118 Type 2 diabetes mellitus with unspecified complications: Secondary | ICD-10-CM

## 2013-07-26 DIAGNOSIS — M810 Age-related osteoporosis without current pathological fracture: Secondary | ICD-10-CM | POA: Insufficient documentation

## 2013-07-26 DIAGNOSIS — M1710 Unilateral primary osteoarthritis, unspecified knee: Secondary | ICD-10-CM | POA: Insufficient documentation

## 2013-07-26 LAB — POCT GLYCOSYLATED HEMOGLOBIN (HGB A1C): HEMOGLOBIN A1C: 12.4

## 2013-07-26 LAB — GLUCOSE, POCT (MANUAL RESULT ENTRY): POC GLUCOSE: 99 mg/dL (ref 70–99)

## 2013-07-26 MED ORDER — ATORVASTATIN CALCIUM 40 MG PO TABS: 40.0000 mg | ORAL_TABLET | Freq: Every day | ORAL | Status: DC

## 2013-07-26 MED ORDER — ALENDRONATE SODIUM 70 MG PO TABS: 70.0000 mg | ORAL_TABLET | ORAL | Status: DC

## 2013-07-26 MED ORDER — AMLODIPINE BESYLATE 10 MG PO TABS: 10.0000 mg | ORAL_TABLET | Freq: Every day | ORAL | Status: DC

## 2013-07-26 MED ORDER — GLIPIZIDE ER 10 MG PO TB24: 10.0000 mg | ORAL_TABLET | Freq: Every day | ORAL | Status: DC

## 2013-07-26 MED ORDER — LEVOTHYROXINE SODIUM 125 MCG PO TABS: 125.0000 ug | ORAL_TABLET | Freq: Every day | ORAL | Status: DC

## 2013-07-26 MED ORDER — METFORMIN HCL 1000 MG PO TABS: 1000.0000 mg | ORAL_TABLET | Freq: Two times a day (BID) | ORAL | Status: DC

## 2013-07-26 MED ORDER — PIOGLITAZONE HCL 45 MG PO TABS: 45.0000 mg | ORAL_TABLET | Freq: Every day | ORAL | Status: DC

## 2013-07-26 MED ORDER — VALSARTAN-HYDROCHLOROTHIAZIDE 320-25 MG PO TABS: 1.0000 | ORAL_TABLET | Freq: Every day | ORAL | Status: DC

## 2013-07-26 MED ORDER — NAPROXEN 500 MG PO TABS: 500.0000 mg | ORAL_TABLET | Freq: Two times a day (BID) | ORAL | Status: DC

## 2013-07-26 NOTE — Patient Instructions (Signed)
Diabetes y Kandace Blitz fsica (Diabetes and Exercise) Hacer actividad fsica con regularidad es muy importante. No se trata slo de perder peso. Tiene muchos otros beneficios, como por ejemplo:  Mejorar el Bonita de [place]ia, flexibilidad y resistencia.  Aumenta la densidad sea.  Ayuda a Technical sales engineer.  Disminuye la Air traffic controller.  Aumenta la fuerza muscular.  Reduce el estrs y las tensiones.  Mejora el estado de salud general. Las personas diabticas que realizan actividad fsica tienen beneficios adicionales debido al ejercicio:  Reduce el apetito.  Mejora la utilizacin del azcar (glucosa) por parte del organismo.  Ayuda a disminuir o Special educational needs teacher.  Disminuye la presin arterial.  Ayuda a disminuir los lpidos en sangre (colesterol y triglicridos).  Mejora el uso de la insulina por parte del organismo porque:  Aumenta la sensibilidad del organismo a la insulina.  Reduce las necesidades de insulina del organismo.  Disminuye el riesgo de enfermedad cardaca por la actividad fsica.  Stanton colesterol y los triglicridos.  Aumenta los niveles de colesterol bueno (como las lipoprotenas de alta densidad [HDL]) en el organismo.  Disminuye los niveles de glucosa en [place]. SU PLAN DE ACTIVIDAD  Elija una actividad que disfrute y establezca objetivos realistas. Su mdico o educador en diabetes podrn ayudarlo a Conservation officer, nature que lo beneficie. Podr dividir las actividades en 2 o 3 sesiones a lo Teacher, early years/pre. Hacer esto es tan bueno como hacer una sesin prolongada. Las ideas para los ejercicios incluyen:  Lleve a Probation officer.  Utilice las Clinical cytogeneticist del ascensor.  Baile su cancin favorita.  Haga sus ejercicios favoritos con Gaffer. RECOMENDACIONES PARA REALIZAR EJERCICIOS CUANDO SE TIENE DIABETES TIPO 1 O TIPO 2   Controle la glucosa en sangre antes de comenzar. Si el nivel de glucosa en sangre es de ms de 240  mg/dl, controle las cetonas en la [place]. Si hay cetonas, no realice actividad fsica.  Evite inyectarse insulina en las zonas del cuerpo que ejercitar. Por ejemplo, evite inyectarse insulina en:  Los brazos, si juega al tenis.  Las piernas, si corre.  Lleve un registro de:  Alimentos que consume antes y despus de TEFL teacher.  Los momentos esperables de picos de accin de la insulina.  Los niveles de glucosa en sangre antes y despus de hacer Hundred.  El tipo y cantidad de [place]  Revise los registros con su mdico. El mdico lo ayudar a Actor pautas para ajustar la cantidad de alimento y las cantidades de insulina antes y despus de Field seismologist ejercicios.  Si toma insulina o agentes hipoglucemiantes por va oral, observe si hay signos y sntomas de hipoglucemia. Ellos son:  Mareos.  Temblores.  Sudoracin.  Escalofros.  Confusin.  Beba gran cantidad de agua mientras hace ejercicios para evitar la deshidratacin o el infarto. Durante la actividad fsica se pierde Chiropodist.  Comente con su mdico antes de comenzar un programa de actividad fsica para verificar que sea seguro para usted. Recuerde, cualquier actividad es mejor que ninguna. Document Released: 01/20/2007 Document Revised: 09/02/2012 [facility] Patient Information 2015 [place]. This information is not intended to replace advice given to you by your health care provider. Make sure you discuss any questions you have with your health care provider. La diabetes mellitus y los alimentos (Diabetes Mellitus and Food) Es importante que controle su nivel de azcar en la sangre (glucosa). El nivel de glucosa en sangre depende en gran medida de lo que  usted come. Comer alimentos saludables en las cantidades [place] a lo largo del Training and development officer, aproximadamente a la misma hora [employer], lo ayudar a Chief Technology Officer su nivel de Multimedia programmer. Tambin puede ayudarlo a retrasar o Museum/gallery curator de la diabetes mellitus. Comer de [employer] saludable incluso puede ayudarlo a Chartered loss adjuster de presin arterial y a Science writer o Theatre manager un peso saludable.  CMO PUEDEN AFECTARME LOS ALIMENTOS? Carbohidratos Los carbohidratos afectan el nivel de glucosa en sangre ms que cualquier otro tipo de alimento. El nutricionista lo ayudar a Teacher, adult education cuntos carbohidratos puede consumir en cada comida y ensearle a contarlos. El recuento de carbohidratos es importante para mantener la glucosa en sangre en un nivel saludable, en especial si utiliza insulina o toma determinados medicamentos para la diabetes mellitus. Alcohol El alcohol puede provocar disminuciones sbitas de la glucosa en sangre (hipoglucemia), en especial si utiliza insulina o toma determinados medicamentos para la diabetes mellitus. La hipoglucemia es una afeccin que puede poner en peligro la vida. Los sntomas de la hipoglucemia (somnolencia, mareos y Data processing manager) son similares a los sntomas de haber consumido mucho alcohol.  Si el mdico lo autoriza a beber alcohol, hgalo con moderacin y siga estas pautas:  Las mujeres no deben beber ms de un trago por da, y los hombres no deben beber ms de dos tragos por Training and development officer. Un trago es igual a:  12 onzas (355 ml) de cerveza  5 onzas de vino (150 ml) de vino  1,5onzas (56ml) de bebidas espirituosas  No beba con el estmago vaco.  Mantngase hidratado. Beba agua, gaseosas dietticas o t helado sin azcar.  Las gaseosas comunes, los jugos y otros refrescos podran contener muchos carbohidratos y se Civil Service fast streamer. QU ALIMENTOS NO SE RECOMIENDAN? Cuando haga las elecciones de alimentos, es importante que recuerde que todos los alimentos son distintos. Algunos tienen menos nutrientes que otros por porcin, aunque podran tener la misma cantidad de caloras o carbohidratos. Es difcil darle al cuerpo lo que necesita cuando consume alimentos con menos nutrientes. Estos son  algunos ejemplos de alimentos que debera evitar ya que contienen muchas caloras y carbohidratos, pero pocos nutrientes:  Physicist, medical trans (la mayora de los alimentos procesados incluyen grasas trans en la etiqueta de Informacin nutricional).  Gaseosas comunes.  Jugos.  Caramelos.  Dulces, como tortas, pasteles, rosquillas y [place].  Comidas fritas. QU ALIMENTOS PUEDO COMER? Consuma alimentos ricos en nutrientes, que nutrirn el cuerpo y lo mantendrn saludable. Los alimentos que debe comer tambin dependern de varios factores, como:  Las caloras que necesita.  Los medicamentos que toma.  Su peso.  El nivel de glucosa en [place].  El [place] de presin arterial.  El nivel de colesterol. Tambin debe consumir una variedad de [place], como:  Protenas, como carne, aves, pescado, tofu, frutos secos y semillas (las protenas de Neal magros son mejores).  Lambert Mody.  Verduras.  Productos lcteos, como [place], queso y yogur (descremados son mejores).  Panes, granos, pastas, cereales, arroz y frijoles.  Grasas, como aceite de Mutual, [place] sin grasas trans, aceite de canola, aguacate y [place]. TODOS LOS QUE PADECEN DIABETES MELLITUS TIENEN EL [place] PLAN DE [place]? Dado que todas las personas que padecen diabetes mellitus son distintas, no hay un solo plan de comidas que funcione para todos. Es muy importante que se rena con un nutricionista que lo ayudar a crear un plan de comidas adecuado para usted. Document Released: 04/09/2007 Document Revised: 01/05/2013 [facility] Patient Information 2015 [place]. This information is  not intended to replace advice given to you by your health care provider. Make sure you discuss any questions you have with your health care provider. Plan de alimentacin DASH (DASH Eating Plan) DASH es la sigla en ingls de "Enfoques alimentarios para bajar la hipertensin". El plan de alimentacin DASH ha demostrado bajar la presin  arterial elevada (hipertensin). Los beneficios adicionales para la salud pueden incluir la disminucin del riesgo de diabetes mellitus tipo2, enfermedades cardacas e ictus. Este plan tambin puede ayudar a Horticulturist, commercial. QU DEBO SABER ACERCA DEL PLAN DE ALIMENTACIN DASH? Para el plan de alimentacin DASH, seguir las siguientes pautas generales:  Elija los alimentos con un valor porcentual diario de sodio de menos del 5% (segn figura en la etiqueta del alimento).  Use hierbas o aderezos sin sal, en lugar de sal de mesa o sal marina.  Consulte al mdico o farmacutico antes de usar sustitutos de la sal.  Coma productos con bajo contenido de sodio, cuya etiqueta suele decir "bajo contenido de sodio" o "sin agregado de sal".  Coma alimentos frescos.  Coma ms verduras, frutas y productos lcteos con bajo contenido de [place].  Elija los cereales integrales. Busque el trmino "integrales" como la segunda palabra en la lista de ingredientes.  Elija el pescado y el pollo o el pavo sin piel ms a menudo que las carnes rojas. Limite el consumo de pescado, carne de ave y carne a 6onzas (170g) por Training and development officer.  Limite el consumo de dulces, postres, azcares y bebidas azucaradas.  Elija las grasas saludables para el corazn.  Limite el consumo de queso a 1oz (28g) por Training and development officer.  Consuma ms alimentos caseros y [employer] comida rpida, de bufs o de restaurantes.  Limite el consumo de alimentos fritos.  Cocine los alimentos con otros mtodos que no sean la fritura.  Limite las verduras enlatadas. Si las consume, enjuguelas bien para disminuir el sodio.  Cuando coma en un restaurante, pida que preparen su comida con menos sal o, en lo posible, sin nada de sal. QU ALIMENTOS PUEDO COMER? Pida ayuda a un nutricionista para conocer las necesidades calricas individuales. Cereales Pan de salvado o integral. Arroz integral. Pastas de salvado o integrales. Quinua, trigo burgol y cereales integrales.  Cereales con bajo contenido de sodio. Tortillas de harina de maz o de salvado. Pan de maz integral. Galletas saladas integrales. Galletas con bajo contenido de [place]. Vegetales Verduras frescas o congeladas (crudas, al vapor, asadas o grilladas). Jugos de tomate y verduras con contenido bajo o reducido de sodio. Pasta y salsa de tomate con contenido bajo o [place]. Verduras enlatadas con bajo contenido de sodio o reducido de sodio.  Lambert Mody Lambert Mody frescas, en conserva (en su jugo natural) o frutas congeladas. Carnes y otras fuentes de protenas Carne de res [place] (al 85% o ms [place]), carne de res de animales alimentados con pastos o carne de res sin la grasa. Pollo o pavo sin piel. Carne de pollo o de [place]. Cerdo sin la grasa. Todos los pescados y frutos de mar. Huevos. Porotos, guisantes o lentejas secos. Frutos secos y semillas sin sal. Frijoles enlatados sin sal. Lcteos Productos lcteos con bajo contenido de grasas, como [place] o al 1%, quesos reducidos en grasas o al 2%, ricota con bajo contenido de grasas o [employer], o yogur natural con bajo contenido de [place]. Quesos con contenido bajo o reducido de sodio. Grasas y Naval architect en barra que no contengan grasas trans. [place] y alios para ensaladas livianos  o reducidos en grasas (reducidos en sodio). [place]. Aceites de crtamo, oliva o canola. [place] natural de man o almendra. Otros Palomitas de maz y pretzels sin sal. Esta no es una lista completa de los alimentos o las bebidas recomendados. Consulte a su nutricionista para conocer ms opciones. QU ALIMENTOS NO ESTN RECOMENDADOS? Cereales Pan blanco. Pastas blancas. Arroz blanco. Pan de maz refinado. Bagels y croissants. Galletas saladas que contengan grasas trans. Vegetales Vegetales con crema o fritos. Verduras en [place]. Verduras enlatadas comunes. Pasta y salsa de tomate en lata comunes. Jugos comunes de tomate y de  verduras. Lambert Mody Frutas secas. Fruta enlatada en almbar liviano o espeso. Jugo de frutas. Carnes y otras fuentes de protenas [place] de carne con Lobbyist. Costillas, alas de pollo, tocineta, salchicha, mortadela, salame, chinchulines, tocino, perros calientes, salchichas alemanas y embutidos envasados. Frutos secos y semillas con sal. Frijoles con sal en lata. Lcteos Leche entera o al 2%, crema, mezcla de [place] y crema y queso crema. Yogur entero o endulzado. Quesos o queso azul con alto contenido de Physicist, medical. Cremas y coberturas batidas no lcteas. Quesos procesados, quesos para untar o cuajadas. Condimentos Sal de cebolla y ajo, sal condimentada, sal de mesa y sal marina. Salsas en lata y envasadas. Salsa [place]. Salsa trtara. Salsa barbacoa. Salsa teriyaki. Salsa de soja, incluso la que tiene contenido reducido de [place]. Salsa de carne. Salsa de pescado. Salsa de [place]. Salsa rosada. Rbanos picantes. Ketchup y mostaza. Saborizantes y tiernizantes para carne. Caldo en cubitos. Salsa picante. Salsa tabasco. Adobos. Aderezos para tacos. Salsas. Grasas y aceites [place], [place] en barra, [place] de [place], [place], [place] clarificada y Wendee Copp de tocineta. Aceites de coco, de palmiste o de palma. Aderezos comunes para ensalada. Otros Pickles y [place]. Palomitas de maz y pretzels con sal. Esta no es [employer] de los alimentos y las bebidas que Nurse, adult. Consulte a su nutricionista para obtener ms informacin. DNDE Dolan Amen MS INFORMACIN? [place], [place] y Herbalist (National Heart, Lung, and [place]): travelstabloid.com Document Released: 12/20/2010 Document Revised: 01/05/2013 [facility] Patient Information 2015 [place], [place]. This information is not intended to replace advice given to you by your health care provider. Make sure you discuss any questions you have with your health care  provider.

## 2013-07-26 NOTE — Progress Notes (Signed)
Pt here to f/o HTN, Diabetes with medication management. Taking prescribed meds daily and monitoring diet/exercise. Denies pain at this time Need medication refills CBG-99/ A1c-12.4 improving since last visit Need mammogram  Daughter spanish interpretor

## 2013-09-10 NOTE — Progress Notes (Signed)
Patient ID: Linda House, female   DOB: 01-Apr-1945, 68 y.o.   MRN: [id number]   Linda House, is a 68 y.o. female  UTM:[id number]  [id number]:[id number]  DOB - 01/16/1945  Chief Complaint  Patient presents with  . Follow-up  . Diabetes  . Hypertension  . Medication Refill        Subjective:   Linda House is a 68 y.o. female here today for a follow up visit. She has history of hypertension and diabetes, dyslipidemia, osteoporosis, previous TIA, hypothyroidism. She is on medications as listed below. Pt is requesting a refill of her medications. Patient has no major complaint today. She's not sexually active. She would like something for her ongoing knee pain from osteoarthritis. She has not had mammogram done about 2 years. Patient has No headache, No chest pain, No abdominal pain - No Nausea, No new weakness tingling or numbness, No Cough - SOB.  No problems updated.  ALLERGIES: No Known Allergies  PAST MEDICAL HISTORY: Past Medical History  Diagnosis Date  . Diabetes mellitus   . Hypertension     MEDICATIONS AT HOME: Prior to Admission medications   Medication Sig Start Date End Date Taking? Authorizing Provider  alendronate (FOSAMAX) 70 MG tablet Take 1 tablet (70 mg total) by mouth every 7 (seven) days. Take with a full glass of water on an empty stomach. 07/26/13  Yes Tresa Garter, MD  amLODipine (NORVASC) 10 MG tablet Take 1 tablet (10 mg total) by mouth daily. 07/26/13  Yes Tresa Garter, MD  aspirin EC 81 MG tablet Take 1 tablet (81 mg total) by mouth daily. 11/19/12  Yes Tresa Garter, MD  atorvastatin (LIPITOR) 40 MG tablet Take 1 tablet (40 mg total) by mouth daily. 07/26/13  Yes Tresa Garter, MD  Calcium 500-125 MG-UNIT TABS Take 1 tablet by mouth daily. 11/19/12  Yes Cherokee Clowers Essie Christine, MD  glipiZIDE (GLUCOTROL XL) 10 MG 24 hr tablet Take 1 tablet (10 mg total) by mouth daily. 07/26/13  Yes Tresa Garter,  MD  levothyroxine (SYNTHROID, LEVOTHROID) 125 MCG tablet Take 1 tablet (125 mcg total) by mouth daily. 07/26/13  Yes Tresa Garter, MD  metFORMIN (GLUCOPHAGE) 1000 MG tablet Take 1 tablet (1,000 mg total) by mouth 2 (two) times daily with a meal. 07/26/13  Yes Frank Pilger E Doreene Burke, MD  pioglitazone (ACTOS) 45 MG tablet Take 1 tablet (45 mg total) by mouth daily. 07/26/13  Yes Tresa Garter, MD  valsartan-hydrochlorothiazide (DIOVAN-HCT) 320-25 MG per tablet Take 1 tablet by mouth daily. 07/26/13  Yes Tresa Garter, MD  benzocaine-resorcinol (VAGISIL) 5-2 % vaginal cream Place vaginally at bedtime. 04/26/13   Tresa Garter, MD  cephALEXin (KEFLEX) 500 MG capsule Take 1 capsule (500 mg total) by mouth 3 (three) times daily. 05/23/12   Harden Mo, MD  HYDROcodone-acetaminophen (NORCO/VICODIN) 5-325 MG per tablet 1 to 2 tabs every 4 to 6 hours as needed for pain. 05/23/12   Harden Mo, MD  naproxen (NAPROSYN) 500 MG tablet Take 1 tablet (500 mg total) by mouth 2 (two) times daily with a meal. 07/26/13   Tresa Garter, MD     Objective:   Filed Vitals:   07/26/13 1629  BP: 121/73  Pulse: 95  Temp: 98.2 F (36.8 C)  TempSrc: Oral  Resp: 16  Height: 4\' 10"  (1.473 m)  Weight: 150 lb (68.04 kg)  SpO2: 96%    Exam General appearance : Awake, alert,  not in any distress. Speech Clear. Not toxic looking HEENT: Atraumatic and Normocephalic, pupils equally reactive to light and accomodation Neck: supple, no JVD. No cervical lymphadenopathy.  Chest:Good air entry bilaterally, no added sounds  CVS: S1 S2 regular, no murmurs.  Abdomen: Bowel sounds present, Non tender and not distended with no gaurding, rigidity or rebound. Extremities: B/L Lower Ext shows no edema, both legs are warm to touch Neurology: Awake alert, and oriented X 3, CN II-XII intact, Non focal  Data Review Lab Results  Component Value Date   HGBA1C 12.4 07/26/2013   HGBA1C 14 04/26/2013   HGBA1C  12.8 11/19/2012     Assessment & Plan   1. Type II or unspecified type diabetes mellitus with unspecified complication, uncontrolled  - Glucose (CBG) - HgB A1c is 12.4% today slightly better than April 14%  2. Osteoporosis, unspecified  - alendronate (FOSAMAX) 70 MG tablet; Take 1 tablet (70 mg total) by mouth every 7 (seven) days. Take with a full glass of water on an empty stomach.  Dispense: 12 tablet; Refill: 3  3. Essential hypertension, benign  - amLODipine (NORVASC) 10 MG tablet; Take 1 tablet (10 mg total) by mouth daily.  Dispense: 90 tablet; Refill: 3 - valsartan-hydrochlorothiazide (DIOVAN-HCT) 320-25 MG per tablet; Take 1 tablet by mouth daily.  Dispense: 90 tablet; Refill: 3  4. Diabetes mellitus due to underlying condition without complications  - glipiZIDE (GLUCOTROL XL) 10 MG 24 hr tablet; Take 1 tablet (10 mg total) by mouth daily.  Dispense: 90 tablet; Refill: 3 - metFORMIN (GLUCOPHAGE) 1000 MG tablet; Take 1 tablet (1,000 mg total) by mouth 2 (two) times daily with a meal.  Dispense: 180 tablet; Refill: 3 - pioglitazone (ACTOS) 45 MG tablet; Take 1 tablet (45 mg total) by mouth daily.  Dispense: 90 tablet; Refill: 3  5. Unspecified hypothyroidism  - levothyroxine (SYNTHROID, LEVOTHROID) 125 MCG tablet; Take 1 tablet (125 mcg total) by mouth daily.  Dispense: 90 tablet; Refill: 3  6. Primary osteoarthritis of left knee  - naproxen (NAPROSYN) 500 MG tablet; Take 1 tablet (500 mg total) by mouth 2 (two) times daily with a meal.  Dispense: 30 tablet; Refill: 0  Patient was extensively counseled on nutrition and exercise. Interpreter was used to communicate directly with patient for the entire encounter including providing detailed patient instructions.   Return in about 3 months (around 10/26/2013), or if symptoms worsen or fail to improve, for Hemoglobin A1C and Follow up, DM, Follow up HTN, Follow up Pain and comorbidities.  The patient was given clear  instructions to go to ER or return to medical center if symptoms don't improve, worsen or new problems develop. The patient verbalized understanding. The patient was told to call to get lab results if they haven't heard anything in the next week.   This note has been created with Surveyor, quantity. Any transcriptional errors are unintentional.    Angelica Chessman, MD, [place], [place], [place] and [facility] [place], [place]

## 2013-09-11 ENCOUNTER — Encounter (HOSPITAL_COMMUNITY): Payer: Self-pay | Admitting: Emergency Medicine

## 2013-09-11 ENCOUNTER — Emergency Department (HOSPITAL_COMMUNITY)
Admission: EM | Admit: 2013-09-11 | Discharge: 2013-09-11 | Disposition: A | Discharge status: Home / Self Care | Payer: Self-pay | Attending: Emergency Medicine | Admitting: Emergency Medicine

## 2013-09-11 DIAGNOSIS — R110 Nausea: Secondary | ICD-10-CM | POA: Insufficient documentation

## 2013-09-11 DIAGNOSIS — R42 Dizziness and giddiness: Secondary | ICD-10-CM | POA: Insufficient documentation

## 2013-09-11 DIAGNOSIS — Z79899 Other long term (current) drug therapy: Secondary | ICD-10-CM | POA: Insufficient documentation

## 2013-09-11 DIAGNOSIS — I10 Essential (primary) hypertension: Secondary | ICD-10-CM | POA: Insufficient documentation

## 2013-09-11 DIAGNOSIS — E119 Type 2 diabetes mellitus without complications: Secondary | ICD-10-CM | POA: Insufficient documentation

## 2013-09-11 HISTORY — DX: Type 2 diabetes mellitus without complications: E11.9

## 2013-09-11 LAB — CBG MONITORING, ED: Glucose-Capillary: 142 mg/dL — ABNORMAL HIGH (ref 70–99)

## 2013-09-11 LAB — URINALYSIS, ROUTINE W REFLEX MICROSCOPIC
BILIRUBIN URINE: NEGATIVE
Glucose, UA: NEGATIVE mg/dL
Hgb urine dipstick: NEGATIVE
KETONES UR: NEGATIVE mg/dL
Nitrite: NEGATIVE
PH: 7.5 (ref 5.0–8.0)
PROTEIN: NEGATIVE mg/dL
Specific Gravity, Urine: 1.007 (ref 1.005–1.030)
UROBILINOGEN UA: 0.2 mg/dL (ref 0.0–1.0)

## 2013-09-11 LAB — URINE MICROSCOPIC-ADD ON

## 2013-09-11 LAB — CBC
HCT: 40.7 % (ref 36.0–46.0)
Hemoglobin: 13.6 g/dL (ref 12.0–15.0)
MCH: 26.1 pg (ref 26.0–34.0)
MCHC: 33.4 g/dL (ref 30.0–36.0)
MCV: 78 fL (ref 78.0–100.0)
PLATELETS: 332 10*3/uL (ref 150–400)
RBC: 5.22 MIL/uL — ABNORMAL HIGH (ref 3.87–5.11)
RDW: 13.5 % (ref 11.5–15.5)
WBC: 8 10*3/uL (ref 4.0–10.5)

## 2013-09-11 LAB — BASIC METABOLIC PANEL
Anion gap: 13 (ref 5–15)
BUN: 9 mg/dL (ref 6–23)
CALCIUM: 9.6 mg/dL (ref 8.4–10.5)
CO2: 27 meq/L (ref 19–32)
CREATININE: 0.59 mg/dL (ref 0.50–1.10)
Chloride: 98 mEq/L (ref 96–112)
Glucose, Bld: 149 mg/dL — ABNORMAL HIGH (ref 70–99)
Potassium: 3.5 mEq/L — ABNORMAL LOW (ref 3.7–5.3)
SODIUM: 138 meq/L (ref 137–147)

## 2013-09-11 MED ORDER — SODIUM CHLORIDE 0.9 % IV BOLUS (SEPSIS)
500.0000 mL | Freq: Once | INTRAVENOUS | Status: AC
  Administered 2013-09-11: 500 mL via INTRAVENOUS

## 2013-09-11 NOTE — ED Provider Notes (Signed)
CSN: [id number]     Arrival date & time 09/11/13  1428 History   None    Chief Complaint  Patient presents with  . Nausea  . Dizziness     (Consider location/radiation/quality/duration/timing/severity/associated sxs/prior Treatment) Patient is a 68 y.o. female presenting with dizziness. The history is provided by the patient and a relative.  Dizziness Quality:  Lightheadedness Severity:  Moderate Onset quality:  Sudden Duration: 2 minutes. Timing:  Rare Progression:  Resolved Chronicity:  New Context comment:  While in a car and she just took her high blood pressure medications Relieved by:  Nothing Worsened by:  Nothing tried Ineffective treatments:  None tried Associated symptoms: nausea   Associated symptoms: no blood in stool, no chest pain, no diarrhea, no headaches, no hearing loss, no palpitations, no shortness of breath, no syncope, no tinnitus, no vision changes, no vomiting and no weakness   Nausea:    Severity:  Moderate   Onset quality:  Sudden   Nausea duration: 2-3 minutes.   Timing:  Rare   Progression:  Resolved Risk factors: no anemia, no hx of stroke, no hx of vertigo and no Meniere's disease     Past Medical History  Diagnosis Date  . Hypertension   . Diabetes mellitus without complication    History reviewed. No pertinent past surgical history. No family history on file. History  Substance Use Topics  . Smoking status: Never Smoker   . Smokeless tobacco: Not on file  . Alcohol Use: No   OB History   Grav Para Term Preterm Abortions TAB SAB Ect Mult Living                 Review of Systems  HENT: Negative for hearing loss and tinnitus.   Respiratory: Negative for shortness of breath.   Cardiovascular: Negative for chest pain, palpitations and syncope.  Gastrointestinal: Positive for nausea. Negative for vomiting, diarrhea and blood in stool.  Neurological: Positive for dizziness. Negative for headaches.  All other systems reviewed and are  negative.     Allergies  Review of patient's allergies indicates no known allergies.  Home Medications   Prior to Admission medications   Medication Sig Start Date End Date Taking? Authorizing Provider  metFORMIN (GLUCOPHAGE) 1000 MG tablet Take 1,000 mg by mouth 2 (two) times daily with a meal.   Yes Historical Provider, MD  valsartan-hydrochlorothiazide (DIOVAN-HCT) 320-25 MG per tablet Take 1 tablet by mouth daily.   Yes Historical Provider, MD   BP 133/56  Pulse 86  Temp(Src) 97.6 F (36.4 C) (Oral)  Resp 18  Ht 4\' 10"  (1.473 m)  Wt 150 lb (68.04 kg)  BMI 31.36 kg/m2  SpO2 99% Physical Exam  Constitutional: She is oriented to person, place, and time. She appears well-developed and well-nourished. No distress.  HENT:  Head: Normocephalic and atraumatic.  Right Ear: External ear normal.  Left Ear: External ear normal.  Eyes: Conjunctivae and EOM are normal. Right eye exhibits no discharge. Left eye exhibits no discharge.  Neck: Normal range of motion. Neck supple. No JVD present.  Cardiovascular: Normal rate, regular rhythm and normal heart sounds.  Exam reveals no gallop and no friction rub.   No murmur heard. Pulmonary/Chest: Effort normal and breath sounds normal. No stridor. No respiratory distress. She has no wheezes. She has no rales. She exhibits no tenderness.  Abdominal: Soft. Bowel sounds are normal. She exhibits no distension. There is no tenderness. There is no rebound and no guarding.  Musculoskeletal:  Normal range of motion. She exhibits no edema.  Neurological: She is alert and oriented to person, place, and time. No cranial nerve deficit (3-12 grossly intact bilaterally). GCS eye subscore is 4. GCS verbal subscore is 5. GCS motor subscore is 6.  Strength in flexion and extension at the shoulders, elbows, wrists, hips, knee, and ankle 5/5 bilaterally EHL 5/5 bilaterally Grip strength 5/5 bilaterally Finger to nose, heel toe shin and rapid alternating  movements intact bilaterally. Sensation over the dorsum of the hand over the 1st, second and 5th metacarpal, and the lateral forearm and lateral shoulder intact bilaterally.  Sensation over the medial and lateral malleolus, and over the 1st metatarsal intact bilaterally.  Skin: Skin is warm. No rash noted. She is not diaphoretic.  Psychiatric: She has a normal mood and affect. Her behavior is normal.    ED Course  Procedures (including critical care time) Labs Review Labs Reviewed  CBC - Abnormal; Notable for the following:    RBC 5.22 (*)    All other components within normal limits  BASIC METABOLIC PANEL - Abnormal; Notable for the following:    Potassium 3.5 (*)    Glucose, Bld 149 (*)    All other components within normal limits  URINALYSIS, ROUTINE W REFLEX MICROSCOPIC - Abnormal; Notable for the following:    Leukocytes, UA MODERATE (*)    All other components within normal limits  URINE MICROSCOPIC-ADD ON - Abnormal; Notable for the following:    Squamous Epithelial / LPF FEW (*)    All other components within normal limits  CBG MONITORING, ED - Abnormal; Notable for the following:    Glucose-Capillary 142 (*)    All other components within normal limits    Imaging Review No results found.   EKG Interpretation   Date/Time:  Saturday September 11 2013 15:10:53 EDT Ventricular Rate:  84 PR Interval:  146 QRS Duration: 107 QT Interval:  410 QTC Calculation: 485 R Axis:   19 Text Interpretation:  Sinus rhythm Baseline wander in lead(s) V3  Non-specific ST-t changes No previous ECGs available Confirmed by Christy Gentles   MD, Elenore Rota ([id number]) on 09/11/2013 3:40:47 PM       MDM   Final diagnoses:  Dizziness  Nausea    Pt presents with acute onset lightheadedness and nausea, but no emesis. Interpreter used for this encounter over the phone.This started while the patient was in the car. No chest pain. No shortness of breath. Non compliant with her BP medicine. FSBS wnls  here. No sig electrolyte abn. UA not suggestive of UTI or significant dehydration. Hgb stable and near baseline. ECG NSR without evidence of sig arrhythmia. Orthostatics wnls. She has a non focal neurologic exam. Nml resp exam. Doubt this is related to an acute intracranial cause. Could be combination of taking her blood pressure medicine and having a vagal episode. Safe to dc home. FU with her PCP to further evaluate and manage her BOP medicine. Strong return precautions given for worsening symptoms or any other alarming or concerning symptoms or issues. The patient was in agreement with the treatment plan and I answered all of their questions. The patient was stable for dc. At dc, the patient ambulated without difficulty, was moving all four extremities, symptoms improved, NAD. and AOx4 Care discussed with my attending, Dr. Christy Gentles. If performed and available, imaging studies and labs reviewed.   Kelby Aline, MD 09/11/13 [phone]

## 2013-09-11 NOTE — ED Notes (Signed)
Pt unable to urinate at this time.  

## 2013-09-11 NOTE — Discharge Instructions (Signed)
Mareos °(Dizziness) °Los mareos son un problema muy frecuente. Se trata de una sensación de inestabilidad o aturdimiento. Puede sentir que se va a desvanecer. Puede provocarle una lesión si se tropieza o se cae. Las personas de cualquier edad pueden sufrir mareos, pero es más frecuente en los ancianos. °CAUSAS  °La causa puede deberse a muchos problemas diferentes: °· Problemas en el oído medio. °· Estar de pie durante mucho tiempo. °· Infecciones. °· Reacciones alérgicas. °· Envejecimiento. °· Respuesta emocional a distintas cosas, como por ejemplo la visión de sangre. °· Efectos secundarios de un medicamento. °· Cansancio. °· Problemas circulatorios o de presión arterial. °· Consumo excesivo de alcohol, medicamentos o drogas. °· Respiración muy rápida (hiperventilación). °· Ritmo cardíaco irregular (arritmia). °· Bajo recuento de glóbulos rojos (anemia). °· Embarazo. °· Vómitos, diarrea, fiebre u otras enfermedades que causan la pérdida de líquidos (deshidratación). °· Enfermedades o trastornos, como enfermedad de Parkinson, presión alta (hipertensión arterial), diabetes y problemas tiroideos. °· Exposición al calor extremo. °DIAGNÓSTICO  °El médico le preguntará acerca de los síntomas, y realizará un examen físico y un electrocardiograma (ECG) para registrar la actividad eléctrica del corazón. También podrá realizarle otras pruebas cardíacas o análisis de sangre para determinar la causa de los mareos. Estos pueden ser: °· Ecocardiograma transtorácico (ETT). Durante el ecocardiograma, se usan ondas sonoras para evaluar cómo fluye la sangre por el corazón. °· Ecocardiograma transesofágico (ETE). °· Monitoreo cardíaco. Este estudio permite que el médico controle la frecuencia y el ritmo cardíaco en tiempo real. °· Monitor Holter. Es un dispositivo portátil que registra los latidos cardíacos y ayuda a diagnosticar las arritmias cardíacas. Le permite al médico registrar la actividad cardíaca durante varios días, si es  necesario. °· Pruebas de estrés por ejercicio o por medicamentos que aceleran los latidos cardíacos. °TRATAMIENTO  °El tratamiento de los mareos depende de la causa de los síntomas y puede variar mucho. °INSTRUCCIONES PARA EL CUIDADO EN EL HOGAR  °· Beba suficiente líquido para mantener la orina clara o de color amarillo pálido. Esto es realmente importante cuando el clima es muy caluroso. En los adultos mayores, también es importante cuando hace frío. °· Tome los medicamentos exactamente como se lo hayan indicado, si los mareos se deben a los medicamentos. Cuando tome medicamentos para la presión arterial, es muy importante que se levante lentamente. °¨ Levántese de las sillas con lentitud y apóyese hasta sentirse bien. °¨ Por la mañana, siéntese primero a un lado de la cama. Cuando se sienta bien, póngase lentamente de pie mientras se sostiene de algo, hasta que sepa que ha logrado el equilibrio. °· Mueva las piernas con frecuencia si debe estar de pie en un lugar durante mucho tiempo. Mientras está de pie, contraiga y relaje los músculos de las piernas. °· Pídale a alguna persona que lo acompañe durante 1 o 2 días si los mareos siguen siendo un problema. Debe estar acompañado hasta que se sienta lo suficientemente bien como para estar solo. Pídale a la persona que llame al médico si observa cambios en usted que sean preocupantes. °· No conduzca vehículos ni utilice maquinarias pesadas si se siente mareado. °· No beba alcohol. °SOLICITE ATENCIÓN MÉDICA DE INMEDIATO SI:  °· Los mareos o el aturdimiento empeoran. °· Siente náuseas o vomita. °· Tiene dificultad para hablar, para caminar o para usar los brazos, las manos o las piernas. °· Se siente débil. °· No piensa con claridad o tiene dificultades para armar oraciones. Es posible que un amigo o un familiar adviertan que esto   ocurre. °· Tiene dolor de pecho, dolor abdominal, sudoración o le falta el aire. °· Hay cambios en la visión. °· Observa un  sangrado. °· Tiene efectos secundarios del medicamento que parecen empeorar en lugar de mejorar. °ASEGÚRESE DE QUE:  °· Comprende estas instrucciones. °· Controlará su afección. °· Recibirá ayuda de inmediato si no mejora o si empeora. °Document Released: 12/31/2004 Document Revised: 01/05/2013 °ExitCare® Patient Information ©2015 [employer]. This information is not intended to replace advice given to you by your health care provider. Make sure you discuss any questions you have with your health care provider. ° °

## 2013-09-11 NOTE — ED Notes (Signed)
Ambulated in hallway did very well. Walked 50 ft

## 2013-09-11 NOTE — ED Provider Notes (Signed)
Patient seen/examined in the Emergency Department in conjunction with Resident Physician Provider Lozier Patient reports dizziness that is now improved Exam : awake/alert, no distress , no arm/leg drift, no facial droop Plan: labs pending at this time.  Will need spanish medical interpreter to complete history   Sharyon Cable, MD 09/11/13 1615

## 2013-09-11 NOTE — ED Notes (Signed)
Pt c/o feeling dizzy around 1030 then having nausea onset 1200. No facial droop, equal grips, speech clear, no arm drift. Pt feels as though her BP is elevated.

## 2013-09-12 NOTE — ED Provider Notes (Signed)
I have personally seen and examined the patient.  I have discussed the plan of care with the resident.  I have reviewed the documentation on PMH/FH/Soc. History.  I have reviewed the documentation of the resident and agree.   Sharyon Cable, MD 09/12/13 [phone]

## 2013-09-13 ENCOUNTER — Encounter: Payer: Self-pay | Admitting: Internal Medicine

## 2013-11-25 ENCOUNTER — Other Ambulatory Visit: Payer: Self-pay

## 2013-11-25 DIAGNOSIS — E079 Disorder of thyroid, unspecified: Secondary | ICD-10-CM

## 2013-11-25 MED ORDER — LEVOTHYROXINE SODIUM 125 MCG PO TABS: 125.0000 ug | ORAL_TABLET | Freq: Every day | ORAL | Status: DC

## 2013-12-24 ENCOUNTER — Ambulatory Visit: Payer: Self-pay | Attending: Internal Medicine

## 2013-12-31 ENCOUNTER — Ambulatory Visit: Payer: Self-pay | Attending: Internal Medicine | Admitting: Internal Medicine

## 2013-12-31 ENCOUNTER — Encounter: Payer: Self-pay | Admitting: Internal Medicine

## 2013-12-31 DIAGNOSIS — Z7982 Long term (current) use of aspirin: Secondary | ICD-10-CM | POA: Insufficient documentation

## 2013-12-31 DIAGNOSIS — Z9114 Patient's other noncompliance with medication regimen: Secondary | ICD-10-CM | POA: Insufficient documentation

## 2013-12-31 DIAGNOSIS — E785 Hyperlipidemia, unspecified: Secondary | ICD-10-CM | POA: Insufficient documentation

## 2013-12-31 DIAGNOSIS — F17200 Nicotine dependence, unspecified, uncomplicated: Secondary | ICD-10-CM | POA: Insufficient documentation

## 2013-12-31 DIAGNOSIS — I10 Essential (primary) hypertension: Secondary | ICD-10-CM | POA: Insufficient documentation

## 2013-12-31 DIAGNOSIS — E079 Disorder of thyroid, unspecified: Secondary | ICD-10-CM | POA: Insufficient documentation

## 2013-12-31 DIAGNOSIS — Z79899 Other long term (current) drug therapy: Secondary | ICD-10-CM | POA: Insufficient documentation

## 2013-12-31 DIAGNOSIS — IMO0002 Reserved for concepts with insufficient information to code with codable children: Secondary | ICD-10-CM

## 2013-12-31 DIAGNOSIS — E1165 Type 2 diabetes mellitus with hyperglycemia: Secondary | ICD-10-CM | POA: Insufficient documentation

## 2013-12-31 LAB — POCT GLYCOSYLATED HEMOGLOBIN (HGB A1C): HEMOGLOBIN A1C: 12.2

## 2013-12-31 LAB — GLUCOSE, POCT (MANUAL RESULT ENTRY): POC Glucose: 340 mg/dl — AB (ref 70–99)

## 2013-12-31 MED ORDER — INSULIN ASPART PROT & ASPART (70-30 MIX) 100 UNIT/ML ~~LOC~~ SUSP
10.0000 [IU] | Freq: Once | SUBCUTANEOUS | Status: AC
  Administered 2013-12-31: 10 [IU] via SUBCUTANEOUS

## 2013-12-31 MED ORDER — METFORMIN HCL 1000 MG PO TABS: 1000.0000 mg | ORAL_TABLET | Freq: Two times a day (BID) | ORAL | Status: DC

## 2013-12-31 MED ORDER — FREESTYLE LANCETS MISC: Status: DC

## 2013-12-31 MED ORDER — GLUCOSE BLOOD VI STRP: ORAL_STRIP | Status: DC

## 2013-12-31 MED ORDER — VALSARTAN-HYDROCHLOROTHIAZIDE 320-25 MG PO TABS
1.0000 | ORAL_TABLET | Freq: Every day | ORAL | Status: DC
Start: 2013-12-31 — End: 2014-09-30

## 2013-12-31 MED ORDER — PIOGLITAZONE HCL 45 MG PO TABS: 45.0000 mg | ORAL_TABLET | Freq: Every day | ORAL | Status: DC

## 2013-12-31 MED ORDER — LEVOTHYROXINE SODIUM 125 MCG PO TABS: 125.0000 ug | ORAL_TABLET | Freq: Every day | ORAL | Status: DC

## 2013-12-31 MED ORDER — GLIPIZIDE ER 10 MG PO TB24: 10.0000 mg | ORAL_TABLET | Freq: Every day | ORAL | Status: DC

## 2013-12-31 MED ORDER — ASPIRIN EC 81 MG PO TBEC: 81.0000 mg | DELAYED_RELEASE_TABLET | Freq: Every day | ORAL | Status: DC

## 2013-12-31 MED ORDER — FREESTYLE SYSTEM KIT: 1.0000 | PACK | Status: DC | PRN

## 2013-12-31 MED ORDER — AMLODIPINE BESYLATE 10 MG PO TABS: 10.0000 mg | ORAL_TABLET | Freq: Every day | ORAL | Status: DC

## 2013-12-31 NOTE — Progress Notes (Signed)
F/U DM  No problems, pt stated no taking medication 3 month

## 2013-12-31 NOTE — Progress Notes (Signed)
Patient ID: Linda House, female   DOB: Feb 14, 1945, 68 y.o.   MRN: [id number]  SUBJECTIVE: 68 y.o. female for follow up of diabetes. Diabetic Review of Systems - medication compliance: noncompliant much of the time, diabetic diet compliance: noncompliant much of the time, home glucose monitoring: is not performed because she does not have a glucometer, further diabetic ROS: no polyuria or polydipsia, no chest pain, dyspnea or TIA's, no numbness, tingling or pain in extremities.  Other symptoms and concerns: Patient is unsure of the medications she should be taking for her diabetes.   Current Outpatient Prescriptions  Medication Sig Dispense Refill  . alendronate (FOSAMAX) 70 MG tablet Take 1 tablet (70 mg total) by mouth every 7 (seven) days. Take with a full glass of water on an empty stomach. (Patient not taking: Reported on 12/31/2013) 12 tablet 3  . amLODipine (NORVASC) 10 MG tablet Take 1 tablet (10 mg total) by mouth daily. (Patient not taking: Reported on 12/31/2013) 90 tablet 3  . aspirin EC 81 MG tablet Take 1 tablet (81 mg total) by mouth daily. (Patient not taking: Reported on 12/31/2013) 90 tablet 3  . atorvastatin (LIPITOR) 40 MG tablet Take 1 tablet (40 mg total) by mouth daily. (Patient not taking: Reported on 12/31/2013) 90 tablet 3  . benzocaine-resorcinol (VAGISIL) 5-2 % vaginal cream Place vaginally at bedtime. (Patient not taking: Reported on 12/31/2013) 60 g 1  . Calcium 500-125 MG-UNIT TABS Take 1 tablet by mouth daily. (Patient not taking: Reported on 12/31/2013) 90 each 3  . cephALEXin (KEFLEX) 500 MG capsule Take 1 capsule (500 mg total) by mouth 3 (three) times daily. (Patient not taking: Reported on 12/31/2013) 30 capsule 0  . glipiZIDE (GLUCOTROL XL) 10 MG 24 hr tablet Take 1 tablet (10 mg total) by mouth daily. (Patient not taking: Reported on 12/31/2013) 90 tablet 3  . HYDROcodone-acetaminophen (NORCO/VICODIN) 5-325 MG per tablet 1 to 2 tabs every 4 to 6 hours  as needed for pain. (Patient not taking: Reported on 12/31/2013) 20 tablet 0  . levothyroxine (SYNTHROID, LEVOTHROID) 125 MCG tablet Take 1 tablet (125 mcg total) by mouth daily. (Patient not taking: Reported on 12/31/2013) 90 tablet 3  . metFORMIN (GLUCOPHAGE) 1000 MG tablet Take 1 tablet (1,000 mg total) by mouth 2 (two) times daily with a meal. (Patient not taking: Reported on 12/31/2013) 180 tablet 3  . metFORMIN (GLUCOPHAGE) 1000 MG tablet Take 1,000 mg by mouth 2 (two) times daily with a meal.    . naproxen (NAPROSYN) 500 MG tablet Take 1 tablet (500 mg total) by mouth 2 (two) times daily with a meal. (Patient not taking: Reported on 12/31/2013) 30 tablet 0  . pioglitazone (ACTOS) 45 MG tablet Take 1 tablet (45 mg total) by mouth daily. (Patient not taking: Reported on 12/31/2013) 90 tablet 3  . valsartan-hydrochlorothiazide (DIOVAN-HCT) 320-25 MG per tablet Take 1 tablet by mouth daily. (Patient not taking: Reported on 12/31/2013) 90 tablet 3  . valsartan-hydrochlorothiazide (DIOVAN-HCT) 320-25 MG per tablet Take 1 tablet by mouth daily.     No current facility-administered medications for this visit.    OBJECTIVE: Appearance: alert, well appearing, and in no distress and oriented to person, place, and time. BP 126/73 mmHg  Pulse 85  Temp(Src) 98.5 F (36.9 C) (Oral)  Resp 16  Ht '4\' 10"'  (1.473 m)  Wt 151 lb (68.493 kg)  BMI 31.57 kg/m2  SpO2 97%  Exam: heart sounds normal rate, regular rhythm, normal S1, S2, no murmurs, rubs,  clicks or gallops, normal bilateral carotid upstroke without bruits, no JVD, chest clear, no carotid bruits, feet: warm, good capillary refill, normal DP and PT pulses, normal monofilament exam and normal sensory exam  ASSESSMENT: Diabetes Mellitus: poorly controlled  PLAN: See orders for this visit as documented in the electronic medical record. Issues reviewed with her: referral to Diabetic Education department, low cholesterol diet, weight control and  daily exercise discussed, all medications, side effects and compliance discussed carefully, foot care discussed and Podiatry visits discussed, annual eye examinations at Ophthalmology discussed and patient urged in the strongest terms to quit smoking.    Julicia was seen today for no specified reason.  Diagnoses and associated orders for this visit:  Essential hypertension, benign - valsartan-hydrochlorothiazide (DIOVAN-HCT) 320-25 MG per tablet; Take 1 tablet by mouth daily. Patient blood pressure is stable and may continue on current medication.  Education on diet, exercise, and modifiable risk factors discussed. Will obtain appropriate labs as needed. Will follow up in 3-6 months.   Diabetes mellitus type 2, uncontrolled - POCT glycosylated hemoglobin (Hb A1C)--12.2% - POCT glucose (manual entry) - Refill amLODipine (NORVASC) 10 MG tablet; Take 1 tablet (10 mg total) by mouth daily. - Refill aspirin EC 81 MG tablet; Take 1 tablet (81 mg total) by mouth daily. - Refill glipiZIDE (GLUCOTROL XL) 10 MG 24 hr tablet; Take 1 tablet (10 mg total) by mouth daily. - Refill metFORMIN (GLUCOPHAGE) 1000 MG tablet; Take 1 tablet (1,000 mg total) by mouth 2 (two) times daily with a meal. - Refill pioglitazone (ACTOS) 45 MG tablet; Take 1 tablet (45 mg total) by mouth daily. - glucose monitoring kit (FREESTYLE) monitoring kit; 1 each by Does not apply route as needed. - glucose blood test strip; Use as instructed - Lancets (FREESTYLE) lancets; Use as instructed - Begin insulin aspart protamine- aspart (NOVOLOG MIX 70/30) injection 10 Units; Inject 0.1 mLs (10 Units total) into the skin once.  Dyslipidemia - levothyroxine (SYNTHROID, LEVOTHROID) 125 MCG tablet; Take 1 tablet (125 mcg total) by mouth daily. Education provided on proper lifestyle changes in order to lower cholesterol. Patient advised to maintain healthy weight and to keep total fat intake at 25-35% of total calories and carbohydrates  50-60% of total daily calories. Explained how high cholesterol places patient at risk for heart disease. Patient placed on appropriate medication and repeat labs in 6 months   Thyroid disease Continue medication  Due to language barrier, an interpreter was present during the history-taking and subsequent discussion (and for part of the physical exam) with this patient.  Return in about 2 weeks (around 01/14/2014) for Nurse Visit-BP and cbg check and 3 mo with original PCP.   Chari Manning, NP 01/24/2014 5:53 PM

## 2013-12-31 NOTE — Patient Instructions (Signed)
Diabetes y cuidados del pie (Diabetes and Foot Care) La diabetes puede ser la causa de que el flujo sanguneo (circulacin) en las piernas y los pies sea deficiente. Debido a esto, la piel de los pies se torna ms delgada, se rompe con facilidad y se cura ms lentamente. La piel puede estar seca, despellejarse y agrietarse. Tambin pueden estar daados los nervios de las piernas y de los pies lo que provoca una disminucin de la sensibilidad. Es posible que no advierta heridas ms pequeas en los pies, que pueden causar infecciones graves. Cuidar sus pies es una de las cosas ms importantes que puede hacer por usted mismo.  INSTRUCCIONES PARA EL CUIDADO EN EL HOGAR  Use siempre calzado, an dentro de su casa. No camine descalzo. Caminar descalzo facilita que se lastime.  Controle sus pies diariamente para observar ampollas, cortes y enrojecimiento. Si no puede ver la planta del pie, use un espejo o pdale ayuda a otra persona.  Lave sus pies con agua tibia (no use agua caliente) y un jabn suave. Seque bien sus pies, y la zona entre los dedos dando palmaditas, hasta que estn completamente secos. Noremoje los pies, ya que esto puede resecar la piel.  Aplique una locin hidratante o vaselina (que no contenga alcohol ni perfume) en los pies y en las uas secas y quebradizas. No aplique locin entre los dedos.  Recorte las uas en forma recta. No escarbe debajo de las uas o alrededor de las cutculas. Lime los bordes de las uas con una lima o esmeril.  No corte las durezas o callosidades, ni trate de quitarlas con medicamentos.  Use calcetines de algodn o medias limpias todos los das. Asegrese de que no le ajusten demasiado. Nouse calcetines que le lleguen a las rodillas, ya que podran disminuir el flujo de sangre a las piernas.  Use zapatos de cuero que le queden bien y que sean acolchados. Para amoldar los zapatos, clcelos slo algunas horas por da. Esto evitar lesiones en los pies. Revise  siempre los zapatos antes de ponerlos para asegurarse de que no haya objetos en su interior.  No cruce las piernas. Esto puede disminuir el flujo de sangre a los pies.  Si algo le ha raspado, cortado o lastimado la piel de los pies, mantenga la piel de esa zona limpia y seca. Debe higienizar estas zonas con agua y un jabn suave. No limpie la zona con agua oxigenada, alcohol ni yodo.  Cuando se quite un vendaje adhesivo, asegrese de no daar la piel.  Si tiene una herida, obsrvela varias veces por da para asegurarse de que se est curando.  No use bolsas de agua caliente ni almohadillas trmicas. Podran causar quemaduras. Si ha perdido la sensibilidad en los pies o las piernas, no sabr lo que le est sucediendo hasta que sea demasiado tarde.  Asegrese de que su mdico le haga un examen completo de los pies por lo menos una vez al ao, o con ms frecuencia si usted tiene problemas en los pies. Informe todos los cortes, llagas o moretones a su mdico inmediatamente. SOLICITE ATENCIN MDICA SI:   Tiene una lesin que no se cura.  Tiene cortes o rajaduras en la piel.  Tiene una ua encarnada.  Nota una zona irritada en las piernas o los pies.  Siente una sensacin de ardor u hormigueo en las piernas o los pies.  Siente dolor o calambres en las piernas o los pies.  Las piernas o los pies estn adormecidos.    Siente los pies siempre fros. SOLICITE ATENCIN MDICA DE INMEDIATO SI:   Presenta enrojecimiento, hinchazn o aumento del dolor en una herida.  Nota una lnea roja que sube por pierna.  Aparece pus en la herida.  Le sube la fiebre o segn lo que le indique el mdico.  Advierte un olor ftido que proviene de una lcera o una herida. Document Released: 12/31/2004 Document Revised: 09/02/2012 [employer] Patient Information 2015 [employer]. This information is not intended to replace advice given to you by your health care provider. Make sure you discuss any questions  you have with your health care provider.  

## 2014-01-21 ENCOUNTER — Ambulatory Visit: Payer: Self-pay

## 2014-01-28 ENCOUNTER — Ambulatory Visit: Payer: Self-pay

## 2014-05-30 ENCOUNTER — Emergency Department (INDEPENDENT_AMBULATORY_CARE_PROVIDER_SITE_OTHER)
Admission: EM | Admit: 2014-05-30 | Discharge: 2014-05-30 | Disposition: A | Discharge status: Home / Self Care | Payer: Self-pay | Source: Home / Self Care | Attending: Family Medicine | Admitting: Family Medicine

## 2014-05-30 ENCOUNTER — Encounter (HOSPITAL_COMMUNITY): Payer: Self-pay | Admitting: Emergency Medicine

## 2014-05-30 DIAGNOSIS — M7918 Myalgia, other site: Secondary | ICD-10-CM

## 2014-05-30 DIAGNOSIS — M940 Chondrocostal junction syndrome [Tietze]: Secondary | ICD-10-CM

## 2014-05-30 DIAGNOSIS — N390 Urinary tract infection, site not specified: Secondary | ICD-10-CM

## 2014-05-30 DIAGNOSIS — M545 Low back pain, unspecified: Secondary | ICD-10-CM

## 2014-05-30 LAB — POCT URINALYSIS DIP (DEVICE)
Glucose, UA: NEGATIVE mg/dL
Nitrite: NEGATIVE
PH: 5 (ref 5.0–8.0)
PROTEIN: 100 mg/dL — AB
Specific Gravity, Urine: 1.02 (ref 1.005–1.030)
Urobilinogen, UA: 0.2 mg/dL (ref 0.0–1.0)

## 2014-05-30 MED ORDER — CEPHALEXIN 500 MG PO CAPS: 500.0000 mg | ORAL_CAPSULE | Freq: Four times a day (QID) | ORAL | Status: DC

## 2014-05-30 MED ORDER — NAPROXEN 375 MG PO TABS: 375.0000 mg | ORAL_TABLET | Freq: Two times a day (BID) | ORAL | Status: DC

## 2014-05-30 NOTE — ED Provider Notes (Signed)
CSN: [id number]     Arrival date & time 05/30/14  1154 History   First MD Initiated Contact with Patient 05/30/14 1404     Chief Complaint  Patient presents with  . Back Pain  . Fever   (Consider location/radiation/quality/duration/timing/severity/associated sxs/prior Treatment) HPI Comments: Approximate 4 days ago the [age]-year-old female who speaks little English, and is accompanied by a significant other that interprets for her, developed urinary frequency and dysuria and urgency. She is also experiencing left low back pain. The pain is constant. She denies any known injury. She has been taking Motrin.   Past Medical History  Diagnosis Date  . Diabetes mellitus   . Hypertension   . Diabetes mellitus without complication    History reviewed. No pertinent past surgical history. Family History  Problem Relation Age of Onset  . Diabetes Father   . Hypertension Father    History  Substance Use Topics  . Smoking status: Never Smoker   . Smokeless tobacco: Not on file  . Alcohol Use: No   OB History    No data available     Review of Systems  Constitutional: Positive for fever, activity change and fatigue.  HENT: Negative.   Respiratory: Negative.   Cardiovascular: Negative.   Gastrointestinal: Negative.   Genitourinary: Positive for dysuria, urgency and frequency. Negative for vaginal bleeding, vaginal discharge and pelvic pain.  Musculoskeletal: Positive for back pain.  Skin: Negative.   Neurological: Negative.     Allergies  Review of patient's allergies indicates no known allergies.  Home Medications   Prior to Admission medications   Medication Sig Start Date End Date Taking? Authorizing Provider  amLODipine (NORVASC) 10 MG tablet Take 1 tablet (10 mg total) by mouth daily. 12/31/13   Lance Bosch, NP  aspirin EC 81 MG tablet Take 1 tablet (81 mg total) by mouth daily. 12/31/13   Lance Bosch, NP  cephALEXin (KEFLEX) 500 MG capsule Take 1 capsule (500 mg  total) by mouth 4 (four) times daily. 05/30/14   Janne Napoleon, NP  glipiZIDE (GLUCOTROL XL) 10 MG 24 hr tablet Take 1 tablet (10 mg total) by mouth daily. 12/31/13   Lance Bosch, NP  glucose blood test strip Use as instructed 12/31/13   Lance Bosch, NP  glucose monitoring kit (FREESTYLE) monitoring kit 1 each by Does not apply route as needed. 12/31/13   Lance Bosch, NP  Lancets (FREESTYLE) lancets Use as instructed 12/31/13   Lance Bosch, NP  levothyroxine (SYNTHROID, LEVOTHROID) 125 MCG tablet Take 1 tablet (125 mcg total) by mouth daily. 12/31/13   Lance Bosch, NP  metFORMIN (GLUCOPHAGE) 1000 MG tablet Take 1,000 mg by mouth 2 (two) times daily with a meal.    Historical Provider, MD  metFORMIN (GLUCOPHAGE) 1000 MG tablet Take 1 tablet (1,000 mg total) by mouth 2 (two) times daily with a meal. 12/31/13   Lance Bosch, NP  naproxen (NAPROSYN) 375 MG tablet Take 1 tablet (375 mg total) by mouth 2 (two) times daily. 05/30/14   Janne Napoleon, NP  pioglitazone (ACTOS) 45 MG tablet Take 1 tablet (45 mg total) by mouth daily. 12/31/13   Lance Bosch, NP  valsartan-hydrochlorothiazide (DIOVAN-HCT) 320-25 MG per tablet Take 1 tablet by mouth daily.    Historical Provider, MD  valsartan-hydrochlorothiazide (DIOVAN-HCT) 320-25 MG per tablet Take 1 tablet by mouth daily. 12/31/13   Lance Bosch, NP   BP 132/97 mmHg  Pulse 114  Temp(Src) 98.5  F (36.9 C) (Oral)  Resp 12  SpO2 95% Physical Exam  Constitutional: She appears well-developed and well-nourished. No distress.  Neck: Normal range of motion. Neck supple.  Cardiovascular: Normal rate and regular rhythm.   Murmur heard. Pulmonary/Chest: Effort normal and breath sounds normal. No respiratory distress. She has no wheezes. She has no rales.  Abdominal: Soft. She exhibits no distension. There is no tenderness. There is no rebound and no guarding.  Musculoskeletal: She exhibits no edema.  Neurological: She is alert. No cranial nerve  deficit. She exhibits normal muscle tone.  Skin: Skin is warm and dry.  Psychiatric: She has a normal mood and affect.  Nursing note and vitals reviewed.   ED Course  Procedures (including critical care time) Labs Review Labs Reviewed  POCT URINALYSIS DIP (DEVICE) - Abnormal; Notable for the following:    Bilirubin Urine SMALL (*)    Ketones, ur TRACE (*)    Hgb urine dipstick TRACE (*)    Protein, ur 100 (*)    Leukocytes, UA SMALL (*)    All other components within normal limits    Imaging Review No results found.   MDM   1. UTI (lower urinary tract infection)   2. Left-sided low back pain without sciatica   3. Costochondritis   4. Muscle pain, lumbar    AZO Keflex as dir Naprosyn for back pain Lots of fluids Pending urine cult.    Janne Napoleon, NP 05/30/14 [place], NP 05/30/14 [phone]

## 2014-05-30 NOTE — Discharge Instructions (Signed)
Dolor de espalda en el adulto (Back Pain, Adult)  El dolor de cintura es frecuente. Aproximadamente 1 de cada 5 personas lo sufren.La causa rara vez pone en peligro la vida. Con frecuencia mejora luego de algn tiempo.Alrededor de la mitad de las personas que sufren un inicio sbito de dolor de cintura, se sentirn mejor luego de 2 semanas. Aproximadamente 8 de cada 10 se sentirn mejor luego de 6 semanas.  CAUSAS  Algunas causas comunes son:   Distensin de los msculos o ligamentos que sostienen la columna vertebral.  Desgaste (degeneracin) de los discos vertebrales.  Artritis.  Traumatismos directos en la espalda. DIAGNSTICO  La mayor parte de las veces, la causa directa no se conoce.Sin embargo, Conservation officer, historic buildings puede tratarse efectivamente an cuando no se Community education officer.Una de las formas ms precisas de asegurar que la causa del dolor no constituye un peligro es responder a las preguntas del mdico acerca de su salud y sus sntomas. Si el mdico necesita ms informacin, podr indicar anlisis de laboratorio o Optometrist un diagnstico por imgenes (radiografas o Health visitor).Sin embargo, aunque las [employer] modificaciones, generalmente no es necesaria la [place].  INSTRUCCIONES PARA EL CUIDADO EN EL HOGAR  En algunas personas, el dolor de espalda vuelve.Como rara vez es peligroso, los pacientes pueden aprender a Education administrator.   Mantngase activo. Si permanece sentado o de pie mucho tiempo en el mismo lugar, se tensiona la espalda.  No se siente, maneje ni se quede parado en un mismo lugar por ms de 30 minutos. Realice caminatas cortas en superficies planas ni bien el dolor haya cedido. Trate de Orthoptist tiempo que camina .  No se quede en la cama.Si hace reposo durante ms de 1 o 2 das, puede Geologist, engineering.  No evite los ejercicios ni el trabajo.El cuerpo est hecho para moverse.No es peligroso estar [place], aunque le duela la  espalda.La espalda se curar ms rpido si contina sus actividades antes de que el dolor se vaya.  Preste atencin a su cuerpo cuando se incline y se levante. Muchas personas sienten menos molestias cuando levantan objetos si doblan las rodillas, mantienen la carga cerca del cuerpo y evitan torcerse. Generalmente, las posiciones ms cmodas son las que ejercen menos tensin en la espalda en recuperacin.  Encuentre una posicin cmoda para dormir. Utilice un colchn firme y recustese de [place]. Doble ligeramente sus rodillas. Si se recuesta sobre su espalda, coloque una almohada debajo de sus rodillas.  Tome slo medicamentos de venta libre o recetados, segn las indicaciones del mdico. Los medicamentos de venta libre para Glass blower/designer y reducir Futures trader, son los que en general ms ayudan.El mdico podr prescribirle relajantes musculares.Estos medicamentos calman el dolor de modo que pueda retornar a sus actividades normales y a Marine scientist.  Aplique hielo sobre la zona lesionada.  Ponga el hielo en una bolsa plstica.  Colquese una toalla entre la piel y la bolsa de hielo.  Deje la bolsa de hielo durante 15 a 20 minutos 3 a 4 veces por da, durante los primeros 2  3 das. Luego podr alternar Lyndal Pulley calor y 60 para reducir Conservation officer, historic buildings y los espasmos.  Consulte a su mdico si puede tratar de hacer ejercicios para la espalda y recibir un masaje suave. Pueden ser beneficiosos.  Evite sentirse ansioso o estresado.El estrs aumenta la tensin muscular y puede empeorar el dolor de espalda.Es importante reconocer cuando est ansioso o estresado y aprender la forma  de controlarlos.El ejercicio es una gran opcin. SOLICITE ATENCIN MDICA SI:   Siente un dolor que no se alivia con reposo o medicamentos.  El dolor no mejora en 1 semana.  Desarrolla nuevos sntomas.  No se siente bien en general. SOLICITE ATENCIN MDICA DE INMEDIATO SI:  Siente un dolor  que se irradia desde la espalda hacia sus piernas.  Desarrolla nuevos problemas en el intestino o la vejiga.  Siente debilidad o adormecimiento inusual en sus brazos o piernas.  Presenta nuseas o vmitos.  Presenta dolor abdominal.  Se siente desfalleciente. Document Released: 12/31/2004 Document Revised: 07/02/2011 [facility] Patient Information 2015 [place], [place]. This information is not intended to replace advice given to you by your health care provider. Make sure you discuss any questions you have with your health care provider.  Costocondritis (Costochondritis) La costocondritis a veces llamada sndrome de [place] es la hinchazn e irritacin (inflamacin) del tejido Statistician) que une las costillas con el (esternn). Esto causa dolor en el pecho y la zona de las [place]. La costocondritis generalmente desaparece por s misma con el tiempo. Podr tardar Waylan Boga 6 semanas en curarse o ms, especialmente si no puede restringir sus actividades. CAUSAS  Algunos casos de costocondritis no tienen causa conocida. Las causas posibles son:  Kathee Delton (traumatismos).  Ejercicios o actividades relacionadas con [employer].  Tos intensa. SIGNOS Y SNTOMAS  Dolor y sensibilidad en el rea de las [place].  Dolor que empeora al toser o respirar profundamente.  Dolor que empeora con movimientos especficos. DIAGNSTICO  El mdico le preguntar acerca de sus sntomas y le har un examen fsico. Podr indicarle radiografas para descartar otros problemas. TRATAMIENTO  La costocondritis generalmente desaparece por s misma con el tiempo. El mdico podr recetar algunos medicamentos para Glass blower/designer. INSTRUCCIONES PARA EL CUIDADO EN EL HOGAR   Evite la actividad fsica extenuante. Trate de no esforzar las [employer] actividades habituales. Aqu se incluyen las actividades en las que [place] los msculos del pecho, abdominales y los msculos laterales, especialmente si debe  levantar peso.  Aplique hielo en la zona afectada durante los primeros 2 das despus del inicio del dolor.  Ponga el hielo en una bolsa plstica.  Colquese una toalla entre la piel y la bolsa de hielo.  Deje el hielo durante 20 minutos, y aplquelo 2-3 veces por Training and development officer.  Tome slo medicamentos de venta libre o recetados, segn las indicaciones del mdico. SOLICITE ATENCIN MDICA SI:  Tiene hinchazn o irritacin en las articulaciones [place]. Estos son signos de infeccin.  El dolor no desaparece aunque haga reposo o tome medicamentos para Conservation officer, historic buildings. SOLICITE ATENCIN MDICA DE INMEDIATO SI:   El dolor aumenta o siente muchas molestias.  Le falta el aire o tiene dificultad para respirar.  Tose y escupe sangre.  Siente falta de aire o dolor en el pecho, sudoracin o vmitos.  Tiene fiebre o sntomas persistentes durante ms de 2 - 3 das.  Tiene fiebre y los sntomas empeoran repentinamente. ASEGRESE DE QUE:   Comprende estas instrucciones.  Controlar su afeccin.  Recibir ayuda de inmediato si no mejora o si empeora. Document Released: 10/10/2004 Document Revised: 10/21/2012 [facility] Patient Information 2015 [place]. This information is not intended to replace advice given to you by your health care provider. Make sure you discuss any questions you have with your health care provider.  Dolor msculoesqueltico (Musculoskeletal Pain) El dolor musculoesqueltico se siente en huesos y msculos. El dolor puede ocurrir en Production manager  del cuerpo. El profesional que lo asiste podr tratarlo sin Pharmacist, community causa del dolor. Lo tratar [employer] de laboratorio (sangre y [place]), las radiografas y [place]. La causa de estos dolores puede ser un virus.  CAUSAS Generalmente no existe una causa definida para este trastorno. Tambin el [employer] [place] [employer] puede deberse a la [place]. En la actividad excesiva se incluye el hacer  ejercicios fsicos muy intensos cuando no se est en buena forma. El dolor de huesos tambin puede deberse a cambios climticos. Los huesos son sensibles a los cambios en la presin atmosfrica. [place]  Para proteger su privacidad, no se entregarn los [employer] pruebas por telfono. Asegrese de conseguirlos. Consulte el modo en que podr obtenerlos si no se lo han informado. Es su responsabilidad contar con los [employer].  Utilice los medicamentos de venta libre o de prescripcin para Conservation officer, historic buildings, Health and safety inspector o la [place], segn se lo indique el profesional que lo asiste. Si le han administrado medicamentos, no conduzca, no opere maquinarias ni Teacher, adult education, y tampoco firme documentos legales durante 24 horas. No beba alcohol. No tome pldoras para dormir ni otros medicamentos que Animal nutritionist.  Podr seguir con todas las actividades a menos que stas le ocasionen ms [employer]. Cuando el dolor disminuya, es importante que gradualmente reanude toda la rutina habitual. Retome las actividades comenzando lentamente. Aumente gradualmente la intensidad y la duracin de sus actividades o del ejercicio.  Durante los perodos de dolor intenso, el reposo en cama puede ser beneficioso. Recustese o sintese en la posicin que le sea ms cmoda.  Coloque hielo sobre la zona afectada.  Ponga hielo en [place].  Colquese una toalla entre la piel y la bolsa de hielo.  Aplique el hielo durante 10 a 20 minutos 3  4 veces por da.  Si el dolor empeora, o no desaparece puede ser [employer] repetir las pruebas o Optometrist nuevos exmenes. El profesional que lo asiste podr requerir investigar ms profundamente para Animator causa posible. SOLICITE ATENCIN MDICA DE INMEDIATO SI:  Siente que el dolor empeora y no se alivia con los medicamentos.  Siente dolor en el pecho asociado a falta de aire, sudoracin, nuseas o  vmitos.  El dolor se localiza en el abdomen.  Comienza a sentir nuevos sntomas que parecen ser diferentes o que lo preocupan. ASEGRESE DE QUE:   Comprende las instrucciones para el alta mdica.  Controlar su enfermedad.  Solicitar atencin mdica de inmediato segn las indicaciones. Document Released: 10/10/2004 Document Revised: 03/25/2011 [facility] Patient Information 2015 [place]. This information is not intended to replace advice given to you by your health care provider. Make sure you discuss any questions you have with your health care provider.  Infeccin urinaria  (Urinary Tract Infection) Drink lots of water  La infeccin urinaria puede ocurrir en Clinical cytogeneticist del tracto urinario. El tracto urinario es un sistema de drenaje del cuerpo por el que se eliminan los desechos y el exceso de [place]. El tracto urinario est formado por dos riones, dos urteres, la vejiga y Geologist, engineering. Los riones son rganos que tienen forma de frijol. Cada rin tiene aproximadamente el tamao del puo. Estn situados debajo de las [place], uno a cada lado de la columna vertebral CAUSAS  La causa de la infeccin son los microbios, que son organismos microscpicos, que incluyen hongos, virus, y bacterias. Estos organismos son tan pequeos que slo pueden verse  a travs del microscopio. Las bacterias son los microorganismos que ms comnmente causan infecciones urinarias.  SNTOMAS  Los sntomas pueden variar segn la edad y el sexo del paciente y por la ubicacin de la infeccin. Los sntomas en las mujeres jvenes incluyen la necesidad frecuente e intensa de orinar y una sensacin dolorosa de ardor en la vejiga o en la uretra durante la miccin. Las mujeres y los hombres mayores podrn sentir cansancio, temblores y debilidad y Arts development officer musculares y Social research officer, government abdominal. Si tiene [place], puede significar que la infeccin est en los riones. Otros sntomas son dolor en la espalda o en los lados  debajo de las [place], nuseas y vmitos.  DIAGNSTICO  Para diagnosticar una infeccin urinaria, el mdico le preguntar acerca de sus sntomas. [employer] una [place] de [place]. La muestra de orina se analiza para Hydrographic surveyor bacterias y glbulos blancos de Herbalist. Los glbulos blancos se forman en el organismo para ayudar a Radio broadcast assistant las infecciones.  TRATAMIENTO  Por lo general, las infecciones urinarias pueden tratarse con medicamentos. Debido a que la [employer] de las infecciones son causadas por bacterias, por lo general pueden tratarse con antibiticos. La eleccin del antibitico y la duracin del tratamiento depender de sus sntomas y el tipo de bacteria causante de la infeccin.  INSTRUCCIONES PARA EL CUIDADO EN EL HOGAR   Si le recetaron antibiticos, tmelos exactamente como su mdico le indique. Termine el medicamento aunque se sienta mejor despus de haber tomado slo algunos.  Beba gran cantidad de lquido para mantener la orina de tono claro o color amarillo plido.  Evite la cafena, el t y las bebidas gaseosas. Estas sustancias irritan la vejiga.  Vaciar la vejiga con frecuencia. Evite retener la orina durante largos perodos.  Vace la vejiga antes y despus de Clinical biochemist.  Despus de mover el intestino, las mujeres deben higienizarse la regin perineal desde adelante hacia atrs. Use slo un papel tissue por vez. SOLICITE ATENCIN MDICA SI:   Siente dolor en la espalda.  Le sube la fiebre.  Los sntomas no mejoran luego de 3 das. SOLICITE ATENCIN MDICA DE INMEDIATO SI:   Siente dolor intenso en la espalda o en la zona inferior del abdomen.  Comienza a sentir escalofros.  Tiene nuseas o vmitos.  Tiene una sensacin continua de quemazn o molestias al [employer]. ASEGRESE DE QUE:   Comprende estas instrucciones.  Controlar su enfermedad.  Solicitar ayuda de inmediato si no mejora o empeora. Document Released: 10/10/2004  Document Revised: 09/25/2011 [facility] Patient Information 2015 [place]. This information is not intended to replace advice given to you by your health care provider. Make sure you discuss any questions you have with your health care provider.

## 2014-05-30 NOTE — ED Notes (Signed)
C/o back pain and fever which started Friday States she has pressure when urinating States lower back is hurting

## 2014-06-01 LAB — URINE CULTURE
Colony Count: >=100000
Special Requests: NORMAL

## 2014-06-01 NOTE — ED Notes (Signed)
Urine culture: >100,000 colonies E. Coli.  Pt. adequately treated with Keflex. Linda House 06/01/2014

## 2014-06-30 ENCOUNTER — Ambulatory Visit: Payer: Self-pay | Attending: Internal Medicine

## 2014-08-17 ENCOUNTER — Encounter: Payer: Self-pay | Admitting: Gastroenterology

## 2014-09-30 ENCOUNTER — Ambulatory Visit: Payer: Self-pay | Attending: Internal Medicine | Admitting: Internal Medicine

## 2014-09-30 ENCOUNTER — Encounter: Payer: Self-pay | Admitting: Internal Medicine

## 2014-09-30 VITALS — BP 152/75 | HR 90 | Temp 98.0°F | Resp 16 | Ht <= 58 in | Wt 164.0 lb

## 2014-09-30 DIAGNOSIS — I10 Essential (primary) hypertension: Secondary | ICD-10-CM

## 2014-09-30 DIAGNOSIS — E785 Hyperlipidemia, unspecified: Secondary | ICD-10-CM

## 2014-09-30 DIAGNOSIS — N898 Other specified noninflammatory disorders of vagina: Secondary | ICD-10-CM

## 2014-09-30 DIAGNOSIS — IMO0002 Reserved for concepts with insufficient information to code with codable children: Secondary | ICD-10-CM

## 2014-09-30 DIAGNOSIS — N9489 Other specified conditions associated with female genital organs and menstrual cycle: Secondary | ICD-10-CM | POA: Insufficient documentation

## 2014-09-30 DIAGNOSIS — E1165 Type 2 diabetes mellitus with hyperglycemia: Secondary | ICD-10-CM | POA: Insufficient documentation

## 2014-09-30 DIAGNOSIS — E039 Hypothyroidism, unspecified: Secondary | ICD-10-CM

## 2014-09-30 DIAGNOSIS — R011 Cardiac murmur, unspecified: Secondary | ICD-10-CM

## 2014-09-30 DIAGNOSIS — Z1239 Encounter for other screening for malignant neoplasm of breast: Secondary | ICD-10-CM

## 2014-09-30 LAB — POCT URINALYSIS DIPSTICK
GLUCOSE UA: NEGATIVE
NITRITE UA: NEGATIVE
PH UA: 5.5
Protein, UA: NEGATIVE
Spec Grav, UA: 1.025
UROBILINOGEN UA: 0.2

## 2014-09-30 LAB — POCT GLYCOSYLATED HEMOGLOBIN (HGB A1C): Hemoglobin A1C: 8

## 2014-09-30 LAB — BASIC METABOLIC PANEL
BUN: 16 mg/dL (ref 7–25)
CHLORIDE: 102 mmol/L (ref 98–110)
CO2: 28 mmol/L (ref 20–31)
Calcium: 9.3 mg/dL (ref 8.6–10.4)
Creat: 0.74 mg/dL (ref 0.50–0.99)
Glucose, Bld: 76 mg/dL (ref 65–99)
POTASSIUM: 5.1 mmol/L (ref 3.5–5.3)
SODIUM: 140 mmol/L (ref 135–146)

## 2014-09-30 LAB — GLUCOSE, POCT (MANUAL RESULT ENTRY): POC GLUCOSE: 90 mg/dL (ref 70–99)

## 2014-09-30 MED ORDER — GLIPIZIDE ER 10 MG PO TB24: 10.0000 mg | ORAL_TABLET | Freq: Every day | ORAL | Status: DC

## 2014-09-30 MED ORDER — METFORMIN HCL 1000 MG PO TABS
1000.0000 mg | ORAL_TABLET | Freq: Two times a day (BID) | ORAL | Status: DC
Start: 2014-09-30 — End: 2015-07-14

## 2014-09-30 MED ORDER — AMLODIPINE BESYLATE 10 MG PO TABS: 10.0000 mg | ORAL_TABLET | Freq: Every day | ORAL | Status: DC

## 2014-09-30 MED ORDER — ATORVASTATIN CALCIUM 20 MG PO TABS: 20.0000 mg | ORAL_TABLET | Freq: Every day | ORAL | Status: DC

## 2014-09-30 MED ORDER — PIOGLITAZONE HCL 45 MG PO TABS: 45.0000 mg | ORAL_TABLET | Freq: Every day | ORAL | Status: DC

## 2014-09-30 MED ORDER — VALSARTAN-HYDROCHLOROTHIAZIDE 320-25 MG PO TABS: 1.0000 | ORAL_TABLET | Freq: Every day | ORAL | Status: DC

## 2014-09-30 NOTE — Patient Instructions (Signed)
Dieta para el control del colesterol y las grasas   (Fat and Cholesterol Control Diet)  Los niveles de grasa y colesterol en la sangre y en los órganos se ven influidos por la dieta. Los niveles altos de grasa y colesterol pueden conducir a enfermedades del corazón, de los pequeños y los grandes vasos sanguíneos, de la vesícula biliar, el hígado y el páncreas.   CONTROL DE LA GRASA Y EL COLESTEROL CON LA DIETA   Aunque el ejercicio y el estilo de vida son factores importantes, su dieta es la clave. Esto se debe a que se sabe que ciertos alimentos hacen subir el colesterol y otros lo bajan. El objetivo debe ser equilibrar los alimentos, de modo que tengan un efecto sobre el colesterol y, aún más importante, reemplazar las grasas saturadas y trans con otros tipos de grasas, como las monoinsaturadas y las poliinsaturadas y ácidos grasos omega-3.   En promedio, una persona no debe consumir más de 15 a 17 g de grasas saturadas por día. Las grasas saturadas y trans se consideran grasas "malas", ya que elevan el colesterol LDL. Las grasas saturadas se encuentran principalmente en productos animales como carne, manteca y crema. Sin embargo, eso no significa que tenga que renunciar a todas sus comidas favoritas. Actualmente, hay buenos sustitutos bajos en colesterol, bajos en grasas para la mayoría de las cosas que le gusta comer. Elija aquellos alimentos alternativos que sean bajos en grasas o sin grasas. Elija cortes de peceto o lomo de carne roja. Estos tipos de cortes contienen menos grasa y colesterol. Pollo (sin la piel), pescado, ternera y pechuga de pavo molida son excelentes opciones. Eliminar las carnes grasas, como las salchichas y el salame. Los mariscos contienen poca o casi nada de grasas saturadas. Consuma una porción de 3 oz (85 g) de carne magra, aves o pescado.   Las grasas trans también se llaman "aceites parcialmente hidrogenados". Son aceites manipulados científicamente de modo que son sólidos a  temperatura ambiente, tienen una larga vida y mejoran el sabor y la textura de los alimentos a los que se agregan. Las grasas trans se encuentran en la margarina, masitas, crackers y alimentos horneados.   Al hornear y cocinar, el aceite es un buen sustituto de la manteca. Los aceites monoinsaturados son beneficiosos, sobre todo porque se cree que reducen el colesterol LDL y aumentan el HDL. Los aceites que hay que evitar completamente son los aceites tropicales saturados, como el de coco y palma.   Recuerde consumir una gran cantidad de alimentos de los grupos que están naturalmente libres de grasas saturadas y grasas trans, e incluya pescado, frutas, verduras, frijoles, granos (cebada, arroz, cuscús, trigo bulgur) y pastas (sin salsas de crema).   IDENTIFICACIÓN DE LOS ALIMENTOS QUE REDUCEN LAS GRASAS Y ELCOLESTEROL   La fibra soluble puede reducir el colesterol. Este tipo de fibra se encuentra en las frutas como manzanas, verduras como el brócoli, papas y zanahorias, las legumbres como los frijoles, guisantes y lentejas y granos como la cebada. Los alimentos enriquecidos con esteroles vegetales (fitosteroles) también pueden reducir el colesterol. Consuma al menos 2 g por día de estos alimentos para un efecto de disminución del colesterol.   Lea las etiquetas de los paquetes para identificar los alimentos bajos en grasas saturadas, en grasas trans y los bajos en grasas en el supermercado. Seleccione los quesos que tienen sólo 2 a 3 g de grasa saturada por onza. Utilice margarina saludable para el corazón que sea libre de grasas trans o   deben evitar los aceites parcialmente hidrogenados. Panes y panecillos deben hacerse con cereales integrales (trigo integral o harina de avena integral en lugar de " harina " o " harina enriquecida ") Compre sopas en lata que no sean cremosas, con bajo contenido de sal y sin grasas  adicionadas.  TCNICAS DE PREPARACIN DE LOS ALIMENTOS  Nunca prepare los alimentos fritos. Si usted debe frer, saltee los alimentos en muy poca grasa o use un aerosol de cocina anti adherente. Siempre que sea posible, debe hervir, hornear o asar las carnes y preparar las verduras al vapor. En lugar de agregar mantequilla o margarina a las verduras, use limn y hierbas, pur de manzana y canela (para la calabaza y la batata). Utilice yogur natural sin grasa, salsas y aderezos bajos en grasa para ensaladas.  BAJO EN GRASAS SATURADAS / SUSTITUTOS BAJOS EN GRASA  Carnes / grasas saturadas (g)  Evite: Bife, veteado (3 oz/85 g) / 11 g  Elija: Bife, magro(3 oz/85 g) / 4 g  Evite: Hamburguesa (3 oz/85 g) / 7 g  Elija: Hamburguesa, magra (3 oz/85 g) / 5 g  Evite: Jamn (3 oz/85 g) / 6 g  Elija: Jamn, corte magro (3 oz/85 g) / 2,4 g  Evite: Pollo con piel, carne oscura (3 oz/85 g) / 4 g  Elija: Pollo, sin piel, carne oscura (3 oz/85 g) / 2 g  Evite: Pollo con piel, carne blanca (3 oz/85 g) / 2,5 g  Elija: Pollo, sin piel, carne blanca (3 oz/85 g) / 1 g Lcteos / Grasa saturada (g)   Evite: Leche entera (1 taza) / 5 g  Elija: Leche descremada, 2% (1 taza) / 3 g  Elija: Leche descremada, 1% (1 taza) / 1,5 g  Elija: Leche descremada, 1 taza (0,3 g).  Evite: Queso duro (1 oz/28 g) / 6 g  Elija: Queso de leche descremada (1 oz/28 g) / 2 a 3 g  Evite: Queso cottage, 4% de grasa (1 taza) / 6,5 g  Elija: Queso cottage bajo en grasa, 1% de grasa (1 taza) / 1,5 g  Evite: Helado (1 taza) / 9 g  Elija: Sorbete (1 taza) / 2,5 g  Elija: Yogur congelado descremado (1 taza) / 0,3 g  Elija: Barra de frutas congeladas / trace  Evite: Crema batida (1 cucharada) / 3,5 g  Elija: Crema batida no lctea (1 cucharada) / 1 g Condimentos / Grasas Saturadas (g)   Evite: Mayonesa (1 cucharada) / 2 g  Elija: Mayonesa baja en grasa (1 cucharada) / 1 g  Evite: Mantequilla (1 cucharada) / 7  g  Elija: Margarina light extra (1 cucharada) / 1 g  Evite: Aceite de coco (1 cucharada) / 11,8 g  Elija: Aceite de oliva (1 cucharada) / 1,8 g  Elija: Aceite de maz (1 cucharada) / 1,7 g  Elija: Aceite de crtamo (1 cucharada) / 1,2 g  Elija: Aceite de girasol (1 cucharada) / 1,4 g  Elija: Aceite de soja (1 cucharada) / 0 mg / 2,4 g  Elija: Aceite de canola (1 cucharada) / 0 mg / 1 g Document Released: 12/31/2004 Document Revised: 04/27/2012 ExitCare Patient Information 2015 [employer]. This information is not intended to replace advice given to you by your health care provider. Make sure you discuss any questions you have with your health care provider.  

## 2014-09-30 NOTE — Progress Notes (Signed)
Patient ID: Linda House, female   DOB: 12-03-1945, 69 y.o.   MRN: [id number] SUBJECTIVE: 69 y.o. female for follow up of diabetes. Diabetic Review of Systems - medication compliance: compliant all of the time, diabetic diet compliance: compliant most of the time, further diabetic ROS: no polyuria or polydipsia, no chest pain, dyspnea or TIA's, no numbness, tingling or pain in extremities, no hypoglycemia.  Other symptoms and concerns: complains of strong urine odor when urinating. She denies dysuria, flank pain, nausea, or vomiting. She admits to only drinking small amounts of water per day.   Current Outpatient Prescriptions  Medication Sig Dispense Refill  . amLODipine (NORVASC) 10 MG tablet Take 1 tablet (10 mg total) by mouth daily. 30 tablet 4  . glipiZIDE (GLUCOTROL XL) 10 MG 24 hr tablet Take 1 tablet (10 mg total) by mouth daily. 30 tablet 4  . levothyroxine (SYNTHROID, LEVOTHROID) 125 MCG tablet Take 1 tablet (125 mcg total) by mouth daily. 30 tablet 4  . pioglitazone (ACTOS) 45 MG tablet Take 1 tablet (45 mg total) by mouth daily. 30 tablet 4  . valsartan-hydrochlorothiazide (DIOVAN-HCT) 320-25 MG per tablet Take 1 tablet by mouth daily. 30 tablet 4  . aspirin EC 81 MG tablet Take 1 tablet (81 mg total) by mouth daily. 90 tablet 3  . glucose blood test strip Use as instructed 100 each 12  . glucose monitoring kit (FREESTYLE) monitoring kit 1 each by Does not apply route as needed. 1 each 0  . Lancets (FREESTYLE) lancets Use as instructed 100 each 12  . metFORMIN (GLUCOPHAGE) 1000 MG tablet Take 1 tablet (1,000 mg total) by mouth 2 (two) times daily with a meal. 180 tablet 3  . [DISCONTINUED] atorvastatin (LIPITOR) 40 MG tablet Take 1 tablet (40 mg total) by mouth daily. (Patient not taking: Reported on 12/31/2013) 90 tablet 3  . [DISCONTINUED] Calcium 500-125 MG-UNIT TABS Take 1 tablet by mouth daily. (Patient not taking: Reported on 12/31/2013) 90 each 3   No current  facility-administered medications for this visit.    OBJECTIVE: Appearance: alert, well appearing, and in no distress, oriented to person, place, and time and overweight. BP 152/75 mmHg  Pulse 90  Temp(Src) 98 F (36.7 C)  Resp 16  Ht '4\' 10"'  (1.473 m)  Wt 164 lb (74.39 kg)  BMI 34.29 kg/m2  SpO2 100%  Physical Exam  Constitutional: She is oriented to person, place, and time.  Neck: No JVD present.  Cardiovascular: Normal rate and regular rhythm.   Murmur heard. Pulses:      Dorsalis pedis pulses are 2+ on the right side, and 2+ on the left side.       Posterior tibial pulses are 2+ on the right side, and 2+ on the left side.  Pulmonary/Chest: Effort normal and breath sounds normal.  Musculoskeletal: She exhibits no edema.  Feet:  Right Foot:  Skin Integrity: Negative for skin breakdown.  Left Foot:  Skin Integrity: Negative for skin breakdown.  Neurological: She is alert and oriented to person, place, and time.  Skin: Skin is warm and dry.  Psychiatric: She has a normal mood and affect.   ASSESSMENT: Linda House was seen today for follow-up.  Diagnoses and all orders for this visit:  Diabetes mellitus type 2, uncontrolled -     Glucose (CBG) -     HgB A1c -     Microalbumin, urine -    Refill glipiZIDE (GLUCOTROL XL) 10 MG 24 hr tablet; Take 1 tablet (10  mg total) by mouth daily. -    Refill metFORMIN (GLUCOPHAGE) 1000 MG tablet; Take 1 tablet (1,000 mg total) by mouth 2 (two) times daily with a meal. -    Refill pioglitazone (ACTOS) 45 MG tablet; Take 1 tablet (45 mg total) by mouth daily. Diabetes improved significantly but she still needs to improve to get A1C below 8%. I have really stressed diet to patient to help as a additive effect to the medication.   Essential hypertension, benign -     Basic Metabolic Panel -     valsartan-hydrochlorothiazide (DIOVAN-HCT) 320-25 MG per tablet; Take 1 tablet by mouth daily. -     Refill amLODipine (NORVASC) 10 MG tablet; Take  1 tablet (10 mg total) by mouth daily. Patient blood pressure is stable and may continue on current medication.  Education on diet, exercise, and modifiable risk factors discussed. Will obtain appropriate labs as needed. Will follow up in 3-6 months.   HLD (hyperlipidemia) -     Lipid panel; Future -     Begin atorvastatin (LIPITOR) 20 MG tablet; Take 1 tablet (20 mg total) by mouth daily. Last LDL was 04/2013 with a value of 186. She is not at goal.  I will recheck her lipid today since it has been over one year and start her on statin therapy. I have advised patient to begin a daily 81 mg Aspirin for cardiovascular protection.   Heart murmur -     Echocardiogram; Future This is new, will evaluate  Hypothyroidism, unspecified hypothyroidism type -     TSH; Future -     T4, Free Will send medication refills based off lab values. Has not been checked since 04/2013  Vaginal odor -     Urinalysis Dipstick---negative -     Urine culture No signs of infection noted on exam today. I believe the odor she is having from her urine is because she is consuming small amounts of water per day and her urine is concentrated. Will obtain urine culture to make sure.  Breast cancer screening -     MM DIGITAL SCREENING BILATERAL; Future  Due to language barrier, an interpreter was present during the history-taking and subsequent discussion (and for part of the physical exam) with this patient.  Return in about 3 months (around 12/30/2014) for DM/HTN.

## 2014-09-30 NOTE — Progress Notes (Signed)
Visual interpreter used Linda House [phone] Patient here for follow up on her diabetes Patient also complains of having a strong odor in her urine that started About one month ago

## 2014-10-01 LAB — URINE CULTURE
Colony Count: NO GROWTH
Organism ID, Bacteria: NO GROWTH

## 2014-10-01 LAB — T4, FREE: FREE T4: 0.81 ng/dL (ref 0.80–1.80)

## 2014-10-01 LAB — MICROALBUMIN, URINE: Microalb, Ur: 1.8 mg/dL (ref <2.0)

## 2014-10-03 ENCOUNTER — Ambulatory Visit (HOSPITAL_COMMUNITY)
Admission: RE | Admit: 2014-10-03 | Discharge: 2014-10-03 | Disposition: A | Discharge status: Home / Self Care | Payer: Self-pay | Source: Ambulatory Visit | Attending: Internal Medicine | Admitting: Internal Medicine

## 2014-10-03 DIAGNOSIS — I5189 Other ill-defined heart diseases: Secondary | ICD-10-CM | POA: Insufficient documentation

## 2014-10-03 DIAGNOSIS — I350 Nonrheumatic aortic (valve) stenosis: Secondary | ICD-10-CM | POA: Insufficient documentation

## 2014-10-03 DIAGNOSIS — I10 Essential (primary) hypertension: Secondary | ICD-10-CM | POA: Insufficient documentation

## 2014-10-03 DIAGNOSIS — R011 Cardiac murmur, unspecified: Secondary | ICD-10-CM | POA: Insufficient documentation

## 2014-10-03 DIAGNOSIS — E119 Type 2 diabetes mellitus without complications: Secondary | ICD-10-CM | POA: Insufficient documentation

## 2014-10-03 DIAGNOSIS — I517 Cardiomegaly: Secondary | ICD-10-CM | POA: Insufficient documentation

## 2014-10-03 NOTE — Progress Notes (Signed)
  Echocardiogram 2D Echocardiogram has been performed.  GREGORY, ANGELA 10/03/2014, 10:26 AM

## 2014-10-04 ENCOUNTER — Telehealth: Payer: Self-pay

## 2014-10-04 NOTE — Telephone Encounter (Signed)
Patient returned phone call, please f/u with pt.    °

## 2014-10-04 NOTE — Telephone Encounter (Signed)
-----   Message from Lance Bosch, NP sent at 10/03/2014  8:56 PM EDT ----- No infection found in urine

## 2014-10-04 NOTE — Telephone Encounter (Signed)
Interpreter line used Elita Quick ID# [id number] Patient not available Message left on voice mail to return our call

## 2014-10-17 ENCOUNTER — Telehealth: Payer: Self-pay

## 2014-10-17 NOTE — Telephone Encounter (Signed)
Interpreter line used Linda House Patient not available  Left message on voice mail to return our call

## 2014-10-17 NOTE — Telephone Encounter (Signed)
-----   Message from Lance Bosch, NP sent at 10/13/2014  6:30 PM EDT ----- Echo is normal. She has a benign heart murmer. Nothing to worry about

## 2014-10-19 ENCOUNTER — Encounter: Payer: Self-pay | Admitting: Pharmacist

## 2014-10-19 ENCOUNTER — Ambulatory Visit: Payer: Self-pay | Attending: Internal Medicine | Admitting: Pharmacist

## 2014-10-19 VITALS — BP 128/81 | HR 78

## 2014-10-19 DIAGNOSIS — I10 Essential (primary) hypertension: Secondary | ICD-10-CM | POA: Insufficient documentation

## 2014-10-19 DIAGNOSIS — Z79899 Other long term (current) drug therapy: Secondary | ICD-10-CM | POA: Insufficient documentation

## 2014-10-19 NOTE — Progress Notes (Signed)
S:    Patient arrives in good spirits. Presents to the clinic for hypertension evaluation. Colletta [place] from [employer] was used as patient is Spanish-speaking  Patient reports adherence with medications.  Current BP Medications include:  Amlodipine 10 mg daily, valsartan-HCTZ 320-25 mg daily.  O:   Last 3 Office BP readings: BP Readings from Last 3 Encounters:  10/19/14 128/81  09/30/14 152/75  05/30/14 132/97    BMET    Component Value Date/Time   NA 140 09/30/2014 1042   K 5.1 09/30/2014 1042   CL 102 09/30/2014 1042   CO2 28 09/30/2014 1042   GLUCOSE 76 09/30/2014 1042   BUN 16 09/30/2014 1042   CREATININE 0.74 09/30/2014 1042   CREATININE 0.59 09/11/2013 1459   CALCIUM 9.3 09/30/2014 1042   GFRNONAA >90 09/11/2013 1459   GFRAA >90 09/11/2013 1459    A/P: History of hypertension currently  controlled on current medications. No recommendations for any changes to medications. Patient encouraged to maintain adherence to medications and to follow up Chari Manning as directed.  Results reviewed and written information provided on DASH diet. Total time in face-to-face counseling 15 minutes.   F/U Clinic Visit with Chari Manning, NP.

## 2014-10-19 NOTE — Patient Instructions (Signed)
Follow up with Linda Manning, NP as directed  Plan de alimentacin DASH (DASH Eating Plan) DASH es la sigla en ingls de "Enfoques Alimentarios para Detener la Hipertensin". El plan de alimentacin DASH ha demostrado bajar la presin arterial elevada (hipertensin). Los beneficios adicionales para la salud pueden incluir la disminucin del riesgo de diabetes mellitus tipo2, enfermedades cardacas e ictus. Este plan tambin puede ayudar a Horticulturist, commercial. QU DEBO SABER ACERCA DEL PLAN DE ALIMENTACIN DASH? Para el plan de alimentacin DASH, seguir las siguientes pautas generales:  Elija los alimentos con un valor porcentual diario de sodio de menos del 5% (segn figura en la etiqueta del alimento).  Use hierbas o aderezos sin sal, en lugar de sal de mesa o sal marina.  Consulte al mdico o farmacutico antes de usar sustitutos de la sal.  Coma productos con bajo contenido de sodio, cuya etiqueta suele decir "bajo contenido de sodio" o "sin agregado de sal".  Coma alimentos frescos.  Coma ms verduras, frutas y productos lcteos con bajo contenido de [place].  Elija los cereales integrales. Busque la palabra "integral" en Equities trader de la lista de ingredientes.  Elija el pescado y el pollo o el pavo sin piel ms a menudo que las carnes rojas. Limite el consumo de pescado, carne de ave y carne a 6onzas (170g) por Training and development officer.  Limite el consumo de dulces, postres, azcares y bebidas azucaradas.  Elija las grasas saludables para el corazn.  Limite el consumo de queso a 1onza (28g) por Training and development officer.  Consuma ms comida casera y menos de restaurante, de buf y comida rpida.  Limite el consumo de alimentos fritos.  Cocine los alimentos utilizando mtodos que no sean la fritura.  Limite las verduras enlatadas. Si las consume, enjuguelas bien para disminuir el sodio.  Cuando coma en un restaurante, pida que preparen su comida con menos sal o, en lo posible, sin nada de sal. QU ALIMENTOS  PUEDO COMER? Pida ayuda a un nutricionista para conocer las necesidades calricas individuales. Cereales Pan de salvado o integral. Arroz integral. Pastas de salvado o integrales. Quinua, trigo burgol y cereales integrales. Cereales con bajo contenido de sodio. Tortillas de harina de maz o de salvado. Pan de maz integral. Galletas saladas integrales. Galletas con bajo contenido de [place]. Vegetales Verduras frescas o congeladas (crudas, al vapor, asadas o grilladas). Jugos de tomate y verduras con contenido bajo o reducido de sodio. Pasta y salsa de tomate con contenido bajo o [place]. Verduras enlatadas con bajo contenido de sodio o reducido de sodio.  Lambert Mody Lambert Mody frescas, en conserva (en su jugo natural) o frutas congeladas. Carnes y otros productos con protenas Carne de res molida (al 85% o ms [place]), carne de res de animales alimentados con pastos o carne de res sin la grasa. Pollo o pavo sin piel. Carne de pollo o de [place]. Cerdo sin la grasa. Todos los pescados y frutos de mar. Huevos. Porotos, guisantes o lentejas secos. Frutos secos y semillas sin sal. Frijoles enlatados sin sal. Lcteos Productos lcteos con bajo contenido de grasas, como Reform o al 1%, quesos reducidos en grasas o al 2%, ricota con bajo contenido de grasas o [employer], o yogur natural con bajo contenido de [place]. Quesos con contenido bajo o reducido de sodio. Grasas y Naval architect en barra que no contengan grasas trans. Mayonesa y alios para ensaladas livianos o reducidos en grasas (reducidos en sodio). [place]. Aceites de crtamo, oliva o canola. Mantequilla natural de man o  almendra. Otros Palomitas de maz y pretzels sin sal. Los artculos mencionados arriba pueden no ser [employer] de las bebidas o los alimentos recomendados. Comunquese con el nutricionista para conocer ms opciones. QU ALIMENTOS NO SE RECOMIENDAN? Cereales Pan blanco. Pastas blancas. Arroz  blanco. Pan de maz refinado. Bagels y croissants. Galletas saladas que contengan grasas trans. Vegetales Vegetales con crema o fritos. Verduras en [place]. Verduras enlatadas comunes. Pasta y salsa de tomate en lata comunes. Jugos comunes de tomate y de verduras. Lambert Mody Frutas secas. Fruta enlatada en almbar liviano o espeso. Jugo de frutas. Carnes y otros productos con protenas [place] de carne con Lobbyist. Costillas, alas de pollo, tocineta, salchicha, mortadela, salame, chinchulines, tocino, perros calientes, salchichas alemanas y embutidos envasados. Frutos secos y semillas con sal. Frijoles con sal en lata. Lcteos Leche entera o al 2%, crema, mezcla de [place] y crema, y queso crema. Yogur entero o endulzado. Quesos o queso azul con alto contenido de Physicist, medical. Cremas no lcteas y coberturas batidas. Quesos procesados, quesos para untar o cuajadas. Condimentos Sal de cebolla y ajo, sal condimentada, sal de mesa y sal marina. Salsas en lata y envasadas. Salsa Worcestershire. Salsa trtara. Salsa barbacoa. Salsa teriyaki. Salsa de soja, incluso la que tiene contenido reducido de [place]ma. Salsa de carne. Salsa de pescado. Salsa de [place]. Salsa rosada. Rbano picante. Ketchup y mostaza. Saborizantes y tiernizantes para carne. Caldo en cubitos. Salsa picante. Salsa tabasco. Adobos. Aderezos para tacos. Salsas. Grasas y aceites [place], [place] en barra, [place] de [place], [place], [place] clarificada y Wendee Copp de tocino. Aceites de coco, de palmiste o de palma. Aderezos comunes para ensalada. Otros Pickles y [place]. Palomitas de maz y pretzels con sal. Los artculos mencionados arriba pueden no ser [employer] de las bebidas y los alimentos que se Higher education careers adviser. Comunquese con el nutricionista para obtener ms informacin. DNDE Dolan Amen MS INFORMACIN? [place], del Pulmn y de Herbalist (National Heart, Lung, and [place]):  travelstabloid.[email or web address]   Esta informacin no tiene Marine scientist el consejo del mdico. Asegrese de hacerle al mdico cualquier pregunta que tenga.   Document Released: 12/20/2010 Document Revised: 01/21/2014 Elsevier Interactive Patient Education [employer].

## 2015-01-02 ENCOUNTER — Ambulatory Visit: Payer: Self-pay | Attending: Internal Medicine

## 2015-01-11 ENCOUNTER — Other Ambulatory Visit: Payer: Self-pay | Admitting: Internal Medicine

## 2015-02-03 ENCOUNTER — Ambulatory Visit
Admission: RE | Admit: 2015-02-03 | Discharge: 2015-02-03 | Disposition: A | Discharge status: Home / Self Care | Payer: No Typology Code available for payment source | Source: Ambulatory Visit | Attending: Internal Medicine | Admitting: Internal Medicine

## 2015-02-03 DIAGNOSIS — Z1239 Encounter for other screening for malignant neoplasm of breast: Secondary | ICD-10-CM

## 2015-02-08 ENCOUNTER — Telehealth: Payer: Self-pay

## 2015-02-08 NOTE — Telephone Encounter (Signed)
Interpreter line used Enoch ID# [id number] Tried to contact patient  Patient not available Message left on voice mail to return our call

## 2015-02-08 NOTE — Telephone Encounter (Signed)
-----   Message from Lance Bosch, NP sent at 02/08/2015  5:21 PM EST ----- Normal mammogram

## 2015-02-09 ENCOUNTER — Telehealth: Payer: Self-pay | Admitting: Internal Medicine

## 2015-02-09 NOTE — Telephone Encounter (Signed)
Patient returned nurse phone call about results. Please follow up.

## 2015-02-10 ENCOUNTER — Telehealth: Payer: Self-pay

## 2015-02-10 NOTE — Telephone Encounter (Signed)
Interpreter line used Olin Hauser ID# [phone] Returned patient phone call Patient not available Message left with granddaughter to return our call

## 2015-02-13 ENCOUNTER — Telehealth: Payer: Self-pay

## 2015-02-13 NOTE — Telephone Encounter (Signed)
Interpreter line used Jesus ID# [id number] Patient not available Message was left on voice mail to return our call

## 2015-02-13 NOTE — Telephone Encounter (Signed)
Patient called, asking for results. Patience is best reached after 3pm Please follow up.

## 2015-03-06 MED FILL — LEVOTHYROXINE 125 MCG TAB: 125 | 30 days supply | Qty: 30 | Fill #1

## 2015-03-06 MED FILL — metFORMIN HCL 1000 MG TABS: 1000 | 30 days supply | Qty: 60 | Fill #1

## 2015-03-06 MED FILL — PIOGLITAZONE HCL 45 MG TAB: 45 | 30 days supply | Qty: 30 | Fill #2

## 2015-03-06 MED FILL — ATORVASTATIN 20 MG TABLET: 20 | 30 days supply | Qty: 30 | Fill #3

## 2015-03-06 MED FILL — glipiZIDE ER 10 MG TB24: 10 | 30 days supply | Qty: 30 | Fill #2

## 2015-04-24 MED FILL — glipiZIDE ER 10 MG TB24: 10 | 30 days supply | Qty: 30 | Fill #3

## 2015-04-24 MED FILL — LEVOTHYROXINE 125 MCG TAB: 125 | 30 days supply | Qty: 30 | Fill #2

## 2015-04-24 MED FILL — PIOGLITAZONE HCL 45 MG TAB: 45 | 30 days supply | Qty: 30 | Fill #3

## 2015-04-24 MED FILL — ATORVASTATIN 20 MG TABLET: 20 | 30 days supply | Qty: 30 | Fill #4

## 2015-04-24 MED FILL — metFORMIN HCL 1000 MG TABS: 1000 | 30 days supply | Qty: 60 | Fill #2

## 2015-06-02 ENCOUNTER — Other Ambulatory Visit: Payer: Self-pay | Admitting: Internal Medicine

## 2015-06-02 MED FILL — ATORVASTATIN 20 MG TABLET: 20 | 30 days supply | Qty: 30 | Fill #5

## 2015-06-02 MED FILL — LEVOTHYROXINE 125 MCG TAB: 125 | 30 days supply | Qty: 30 | Fill #0

## 2015-06-02 MED FILL — glipiZIDE ER 10 MG TB24: 10 | 30 days supply | Qty: 30 | Fill #4

## 2015-06-02 MED FILL — metFORMIN HCL 1000 MG TABS: 1000 | 30 days supply | Qty: 60 | Fill #3

## 2015-06-02 MED FILL — PIOGLITAZONE HCL 45 MG TAB: 45 | 30 days supply | Qty: 30 | Fill #4

## 2015-07-14 ENCOUNTER — Telehealth: Payer: Self-pay | Admitting: Internal Medicine

## 2015-07-14 DIAGNOSIS — E785 Hyperlipidemia, unspecified: Secondary | ICD-10-CM

## 2015-07-14 DIAGNOSIS — E1165 Type 2 diabetes mellitus with hyperglycemia: Principal | ICD-10-CM

## 2015-07-14 DIAGNOSIS — IMO0001 Reserved for inherently not codable concepts without codable children: Secondary | ICD-10-CM

## 2015-07-14 MED ORDER — GLIPIZIDE ER 10 MG PO TB24: 10.0000 mg | ORAL_TABLET | Freq: Every day | ORAL | Status: DC

## 2015-07-14 MED ORDER — ATORVASTATIN CALCIUM 20 MG PO TABS: 20.0000 mg | ORAL_TABLET | Freq: Every day | ORAL | Status: DC

## 2015-07-14 MED ORDER — LEVOTHYROXINE SODIUM 125 MCG PO TABS: 125.0000 ug | ORAL_TABLET | Freq: Every day | ORAL | Status: DC

## 2015-07-14 MED ORDER — METFORMIN HCL 1000 MG PO TABS: 1000.0000 mg | ORAL_TABLET | Freq: Two times a day (BID) | ORAL | Status: DC

## 2015-07-14 MED ORDER — AMLODIPINE BESYLATE 10 MG PO TABS: 10.0000 mg | ORAL_TABLET | Freq: Every day | ORAL | Status: DC

## 2015-07-14 NOTE — Telephone Encounter (Signed)
Medications were refilled - patient must have office visit for any further refills.

## 2015-07-14 NOTE — Telephone Encounter (Signed)
PT. Came in requesting a refill on the following medications:  levothyroxine (SYNTHROID, LEVOTHROID) 125 MCG tablet amLODipine (NORVASC) 10 MG tablet  atorvastatin (LIPITOR) 20 MG tablet glipiZIDE (GLUCOTROL XL) 10 MG 24 hr tablet  metFORMIN (GLUCOPHAGE) 1000 MG tablet   Please f/u with pt.

## 2015-07-24 MED FILL — metFORMIN HCL 1000 MG TABS: 1000 | 30 days supply | Qty: 60 | Fill #0

## 2015-07-24 MED FILL — ?ATORVASTATIN 20 MG TABLET: 20 | 30 days supply | Qty: 30 | Fill #0

## 2015-07-24 MED FILL — AMLODIPINE BESYLATE 10 MG T: 10 | 30 days supply | Qty: 30 | Fill #0

## 2015-07-24 MED FILL — ?LEVOTHYROXINE 125 MCG TABL: 125 | 30 days supply | Qty: 30 | Fill #0

## 2015-07-24 MED FILL — glipiZIDE ER 10 MG TB24: 10 | 30 days supply | Qty: 30 | Fill #0

## 2015-08-09 ENCOUNTER — Ambulatory Visit: Payer: Self-pay | Admitting: Internal Medicine

## 2015-08-11 ENCOUNTER — Ambulatory Visit: Payer: No Typology Code available for payment source | Attending: Internal Medicine | Admitting: Family Medicine

## 2015-08-11 ENCOUNTER — Encounter: Payer: Self-pay | Admitting: Family Medicine

## 2015-08-11 VITALS — BP 175/80 | HR 87 | Temp 97.8°F | Ht 59.0 in | Wt 171.6 lb

## 2015-08-11 DIAGNOSIS — E785 Hyperlipidemia, unspecified: Secondary | ICD-10-CM

## 2015-08-11 DIAGNOSIS — IMO0001 Reserved for inherently not codable concepts without codable children: Secondary | ICD-10-CM

## 2015-08-11 DIAGNOSIS — I10 Essential (primary) hypertension: Secondary | ICD-10-CM

## 2015-08-11 DIAGNOSIS — E1169 Type 2 diabetes mellitus with other specified complication: Secondary | ICD-10-CM

## 2015-08-11 DIAGNOSIS — IMO0002 Reserved for concepts with insufficient information to code with codable children: Secondary | ICD-10-CM

## 2015-08-11 DIAGNOSIS — E039 Hypothyroidism, unspecified: Secondary | ICD-10-CM

## 2015-08-11 DIAGNOSIS — E1165 Type 2 diabetes mellitus with hyperglycemia: Secondary | ICD-10-CM

## 2015-08-11 LAB — POCT GLYCOSYLATED HEMOGLOBIN (HGB A1C): Hemoglobin A1C: 8.8

## 2015-08-11 MED ORDER — VALSARTAN-HYDROCHLOROTHIAZIDE 320-25 MG PO TABS: 1.0000 | ORAL_TABLET | Freq: Every day | ORAL | 4 refills | Status: DC

## 2015-08-11 MED ORDER — GLIPIZIDE ER 10 MG PO TB24: 10.0000 mg | ORAL_TABLET | Freq: Every day | ORAL | 3 refills | Status: DC

## 2015-08-11 MED ORDER — ASPIRIN EC 81 MG PO TBEC: 81.0000 mg | DELAYED_RELEASE_TABLET | Freq: Every day | ORAL | 3 refills | Status: AC

## 2015-08-11 MED ORDER — METFORMIN HCL 1000 MG PO TABS: 1000.0000 mg | ORAL_TABLET | Freq: Two times a day (BID) | ORAL | 3 refills | Status: DC

## 2015-08-11 MED ORDER — LEVOTHYROXINE SODIUM 125 MCG PO TABS: 125.0000 ug | ORAL_TABLET | Freq: Every day | ORAL | 3 refills | Status: DC

## 2015-08-11 MED ORDER — ATORVASTATIN CALCIUM 20 MG PO TABS: 20.0000 mg | ORAL_TABLET | Freq: Every day | ORAL | 3 refills | Status: DC

## 2015-08-11 MED ORDER — AMLODIPINE BESYLATE 10 MG PO TABS: 10.0000 mg | ORAL_TABLET | Freq: Every day | ORAL | 3 refills | Status: DC

## 2015-08-11 NOTE — Patient Instructions (Signed)
Diabetes Mellitus and Food It is important for you to manage your blood sugar (glucose) level. Your blood glucose level can be greatly affected by what you eat. Eating healthier foods in the appropriate amounts throughout the day at about the same time each day will help you control your blood glucose level. It can also help slow or prevent worsening of your diabetes mellitus. Healthy eating may even help you improve the level of your blood pressure and reach or maintain a healthy weight.  General recommendations for healthful eating and cooking habits include:  Eating meals and snacks regularly. Avoid going long periods of time without eating to lose weight.  Eating a diet that consists mainly of plant-based foods, such as fruits, vegetables, nuts, legumes, and whole grains.  Using low-heat cooking methods, such as baking, instead of high-heat cooking methods, such as deep frying. Work with your dietitian to make sure you understand how to use the Nutrition Facts information on food labels. HOW CAN FOOD AFFECT ME? Carbohydrates Carbohydrates affect your blood glucose level more than any other type of food. Your dietitian will help you determine how many carbohydrates to eat at each meal and teach you how to count carbohydrates. Counting carbohydrates is important to keep your blood glucose at a healthy level, especially if you are using insulin or taking certain medicines for diabetes mellitus. Alcohol Alcohol can cause sudden decreases in blood glucose (hypoglycemia), especially if you use insulin or take certain medicines for diabetes mellitus. Hypoglycemia can be a life-threatening condition. Symptoms of hypoglycemia (sleepiness, dizziness, and disorientation) are similar to symptoms of having too much alcohol.  If your health care provider has given you approval to drink alcohol, do so in moderation and use the following guidelines:  Women should not have more than one drink per day, and men  should not have more than two drinks per day. One drink is equal to:  12 oz of beer.  5 oz of wine.  1 oz of hard liquor.  Do not drink on an empty stomach.  Keep yourself hydrated. Have water, diet soda, or unsweetened iced tea.  Regular soda, juice, and other mixers might contain a lot of carbohydrates and should be counted. WHAT FOODS ARE NOT RECOMMENDED? As you make food choices, it is important to remember that all foods are not the same. Some foods have fewer nutrients per serving than other foods, even though they might have the same number of calories or carbohydrates. It is difficult to get your body what it needs when you eat foods with fewer nutrients. Examples of foods that you should avoid that are high in calories and carbohydrates but low in nutrients include:  Trans fats (most processed foods list trans fats on the Nutrition Facts label).  Regular soda.  Juice.  Candy.  Sweets, such as cake, pie, doughnuts, and cookies.  Fried foods. WHAT FOODS CAN I EAT? Eat nutrient-rich foods, which will nourish your body and keep you healthy. The food you should eat also will depend on several factors, including:  The calories you need.  The medicines you take.  Your weight.  Your blood glucose level.  Your blood pressure level.  Your cholesterol level. You should eat a variety of foods, including:  Protein.  Lean cuts of meat.  Proteins low in saturated fats, such as fish, egg whites, and beans. Avoid processed meats.  Fruits and vegetables.  Fruits and vegetables that may help control blood glucose levels, such as apples, mangoes, and   yams.  Dairy products.  Choose fat-free or low-fat dairy products, such as milk, yogurt, and cheese.  Grains, bread, pasta, and rice.  Choose whole grain products, such as multigrain bread, whole oats, and brown rice. These foods may help control blood pressure.  Fats.  Foods containing healthful fats, such as nuts,  avocado, olive oil, canola oil, and fish. DOES EVERYONE WITH DIABETES MELLITUS HAVE THE SAME MEAL PLAN? Because every person with diabetes mellitus is different, there is not one meal plan that works for everyone. It is very important that you meet with a dietitian who will help you create a meal plan that is just right for you.   This information is not intended to replace advice given to you by your health care provider. Make sure you discuss any questions you have with your health care provider.   Document Released: 09/27/2004 Document Revised: 01/21/2014 Document Reviewed: 11/27/2012 Elsevier Interactive Patient Education 2016 [employer] Inc.  

## 2015-08-11 NOTE — Progress Notes (Signed)
Subjective:  Patient ID: Linda House, female    DOB: [birth year]  Age: 70 y.o. MRN: [id number]  CC: Follow-up visit  HPI Jamicia Vinessa Macconnell is a 70 year old female with a history of uncontrolled type 2 diabetes mellitus (A1c 8.8) this, hypothyroidism, hyperlipidemia, hypertension who comes in to establish care with me.  She has been out of all her medications and elevated blood pressure and she denies headaches, chest shortness of breath. She does not exercise does not adhere to low-cholesterol, low-sodium diabetic diet.  Denies hypoglycemic episodes has no numbness in extremities. She has no additional complaints today.   Past Medical History:  Diagnosis Date  . Diabetes mellitus   . Diabetes mellitus without complication ([place])   . Hypertension     No past surgical history on file.   Outpatient Medications Prior to Visit  Medication Sig Dispense Refill  . glucose blood test strip Use as instructed 100 each 12  . glucose monitoring kit (FREESTYLE) monitoring kit 1 each by Does not apply route as needed. 1 each 0  . Lancets (FREESTYLE) lancets Use as instructed 100 each 12  . pioglitazone (ACTOS) 45 MG tablet Take 1 tablet (45 mg total) by mouth daily. 30 tablet 4  . amLODipine (NORVASC) 10 MG tablet Take 1 tablet (10 mg total) by mouth daily. Must have office visit for refills 30 tablet 0  . aspirin EC 81 MG tablet Take 1 tablet (81 mg total) by mouth daily. 90 tablet 3  . atorvastatin (LIPITOR) 20 MG tablet Take 1 tablet (20 mg total) by mouth daily. Must have office visit for refills 30 tablet 0  . glipiZIDE (GLUCOTROL XL) 10 MG 24 hr tablet Take 1 tablet (10 mg total) by mouth daily. Must have office visit for refills 30 tablet 0  . levothyroxine (SYNTHROID, LEVOTHROID) 125 MCG tablet Take 1 tablet (125 mcg total) by mouth daily. Must have OV 30 tablet 0  . metFORMIN (GLUCOPHAGE) 1000 MG tablet Take 1 tablet (1,000 mg total) by mouth 2 (two) times daily  with a meal. Must have office visit for refills 60 tablet 0  . valsartan-hydrochlorothiazide (DIOVAN-HCT) 320-25 MG per tablet Take 1 tablet by mouth daily. 30 tablet 4   No facility-administered medications prior to visit.     ROS Review of Systems  Constitutional: Negative for activity change, appetite change and fatigue.  HENT: Negative for congestion, sinus pressure and sore throat.   Eyes: Negative for visual disturbance.  Respiratory: Negative for cough, chest tightness, shortness of breath and wheezing.   Cardiovascular: Negative for chest pain and palpitations.  Gastrointestinal: Negative for abdominal distention, abdominal pain and constipation.  Endocrine: Negative for polydipsia.  Genitourinary: Negative for dysuria and frequency.  Musculoskeletal: Negative for arthralgias and back pain.  Skin: Negative for rash.  Neurological: Negative for tremors, light-headedness and numbness.  Hematological: Does not bruise/bleed easily.  Psychiatric/Behavioral: Negative for agitation and behavioral problems.    Objective:  BP (!) 175/80 (BP Location: Right Arm, Patient Position: Sitting, Cuff Size: Small)   Pulse 87   Temp 97.8 F (36.6 C) (Oral)   Ht '4\' 11"'  (1.499 m)   Wt 171 lb 9.6 oz (77.8 kg)   SpO2 96%   BMI 34.66 kg/m   BP/Weight 08/11/2015 10/19/2014 2/56/3893  Systolic BP 734 287 681  Diastolic BP 80 81 75  Wt. (Lbs) 171.6 - 164  BMI 34.66 - 34.29      Physical Exam  Constitutional: She is oriented to person,  place, and time. She appears well-developed and well-nourished.  Cardiovascular: Normal rate, normal heart sounds and intact distal pulses.   No murmur heard. Pulmonary/Chest: Effort normal and breath sounds normal. She has no wheezes. She has no rales. She exhibits no tenderness.  Abdominal: Soft. Bowel sounds are normal. She exhibits no distension and no mass. There is no tenderness.  Musculoskeletal: Normal range of motion.  Neurological: She is alert  and oriented to person, place, and time.  Skin: Skin is warm and dry.  Psychiatric: She has a normal mood and affect.     Assessment & Plan:   1. Uncontrolled diabetes mellitus type 2 without complications, unspecified long term insulin use status ([place]) Uncontrolled with A1c of 8.8- could be secondary to running out of medications. Refilled medications Advised on diabetic diet - amLODipine (NORVASC) 10 MG tablet; Take 1 tablet (10 mg total) by mouth daily.  Dispense: 30 tablet; Refill: 3 - metFORMIN (GLUCOPHAGE) 1000 MG tablet; Take 1 tablet (1,000 mg total) by mouth 2 (two) times daily with a meal.  Dispense: 60 tablet; Refill: 3 - glipiZIDE (GLUCOTROL XL) 10 MG 24 hr tablet; Take 1 tablet (10 mg total) by mouth daily.  Dispense: 30 tablet; Refill: 3  2. HLD (hyperlipidemia) If lipids are elevated I will make no regimen changes as she has been noncompliant with her medications - atorvastatin (LIPITOR) 20 MG tablet; Take 1 tablet (20 mg total) by mouth daily.  Dispense: 30 tablet; Refill: 3 - Lipid panel; Future  3. Uncontrolled type 2 diabetes mellitus with other specified complication, without long-term current use of insulin ([facility]) - aspirin EC 81 MG tablet; Take 1 tablet (81 mg total) by mouth daily.  Dispense: 30 tablet; Refill: 3  4. Essential hypertension, benign Uncontrolled due to running out of medications Low-sodium diet - valsartan-hydrochlorothiazide (DIOVAN-HCT) 320-25 MG tablet; Take 1 tablet by mouth daily.  Dispense: 30 tablet; Refill: 4 - COMPLETE METABOLIC PANEL WITH GFR; Future  5. Hypothyroidism, unspecified hypothyroidism type If TSH is elevated I will make no regimen changes as she has been out of medications. - TSH; Future   Meds ordered this encounter  Medications  . amLODipine (NORVASC) 10 MG tablet    Sig: Take 1 tablet (10 mg total) by mouth daily.    Dispense:  30 tablet    Refill:  3  . atorvastatin (LIPITOR) 20 MG tablet    Sig: Take 1 tablet  (20 mg total) by mouth daily.    Dispense:  30 tablet    Refill:  3  . aspirin EC 81 MG tablet    Sig: Take 1 tablet (81 mg total) by mouth daily.    Dispense:  30 tablet    Refill:  3  . valsartan-hydrochlorothiazide (DIOVAN-HCT) 320-25 MG tablet    Sig: Take 1 tablet by mouth daily.    Dispense:  30 tablet    Refill:  4  . metFORMIN (GLUCOPHAGE) 1000 MG tablet    Sig: Take 1 tablet (1,000 mg total) by mouth 2 (two) times daily with a meal.    Dispense:  60 tablet    Refill:  3  . levothyroxine (SYNTHROID, LEVOTHROID) 125 MCG tablet    Sig: Take 1 tablet (125 mcg total) by mouth daily.    Dispense:  30 tablet    Refill:  3  . glipiZIDE (GLUCOTROL XL) 10 MG 24 hr tablet    Sig: Take 1 tablet (10 mg total) by mouth daily.    Dispense:  30 tablet    Refill:  3    Follow-up: Return in about 3 weeks (around 09/01/2015) for Follow-up on diabetes mellitus.   Arnoldo Morale MD

## 2015-08-28 ENCOUNTER — Other Ambulatory Visit: Payer: Self-pay | Admitting: Internal Medicine

## 2015-08-28 DIAGNOSIS — IMO0002 Reserved for concepts with insufficient information to code with codable children: Secondary | ICD-10-CM

## 2015-08-28 DIAGNOSIS — E1165 Type 2 diabetes mellitus with hyperglycemia: Secondary | ICD-10-CM

## 2015-08-28 MED FILL — ?VALSARTAN-HCTZ 320-25 MG T: 320-25 | 30 days supply | Qty: 30 | Fill #0

## 2015-08-28 MED FILL — ?ATORVASTATIN 20 MG TABLET: 20 | 30 days supply | Qty: 30 | Fill #0

## 2015-08-28 MED FILL — ?LEVOTHYROXINE 125 MCG TABL: 125 | 30 days supply | Qty: 30 | Fill #0

## 2015-08-28 MED FILL — AMLODIPINE BESYLATE 10 MG T: 10 | 30 days supply | Qty: 30 | Fill #0

## 2015-08-28 MED FILL — PIOGLITAZONE HCL 45 MG TAB: 45 | 30 days supply | Qty: 30 | Fill #0

## 2015-08-28 MED FILL — ?METFORMIN HCL 1,000 MG TAB: 1000 | 30 days supply | Qty: 60 | Fill #0

## 2015-08-28 MED FILL — glipiZIDE XL 10 MG TB24: 10 | 30 days supply | Qty: 30 | Fill #0

## 2015-08-29 ENCOUNTER — Ambulatory Visit: Payer: No Typology Code available for payment source | Attending: Family Medicine

## 2015-08-29 DIAGNOSIS — E785 Hyperlipidemia, unspecified: Secondary | ICD-10-CM | POA: Insufficient documentation

## 2015-08-29 DIAGNOSIS — E039 Hypothyroidism, unspecified: Secondary | ICD-10-CM | POA: Insufficient documentation

## 2015-08-29 DIAGNOSIS — I10 Essential (primary) hypertension: Secondary | ICD-10-CM | POA: Insufficient documentation

## 2015-08-29 LAB — COMPLETE METABOLIC PANEL WITH GFR
ALT: 14 U/L (ref 6–29)
AST: 13 U/L (ref 10–35)
Albumin: 4.1 g/dL (ref 3.6–5.1)
Alkaline Phosphatase: 138 U/L — ABNORMAL HIGH (ref 33–130)
BILIRUBIN TOTAL: 0.4 mg/dL (ref 0.2–1.2)
BUN: 12 mg/dL (ref 7–25)
CO2: 26 mmol/L (ref 20–31)
CREATININE: 0.58 mg/dL (ref 0.50–0.99)
Calcium: 9 mg/dL (ref 8.6–10.4)
Chloride: 104 mmol/L (ref 98–110)
GFR, Est African American: >89 mL/min (ref >=60)
GLUCOSE: 130 mg/dL — AB (ref 65–99)
Potassium: 4.3 mmol/L (ref 3.5–5.3)
SODIUM: 139 mmol/L (ref 135–146)
TOTAL PROTEIN: 7.2 g/dL (ref 6.1–8.1)

## 2015-08-29 LAB — TSH: TSH: 8.29 m[IU]/L — AB

## 2015-08-29 LAB — LIPID PANEL
Cholesterol: 175 mg/dL (ref 125–200)
HDL: 61 mg/dL (ref >=46)
LDL CALC: 89 mg/dL (ref <130)
Total CHOL/HDL Ratio: 2.9 Ratio (ref <=5.0)
Triglycerides: 127 mg/dL (ref <150)
VLDL: 25 mg/dL (ref <30)

## 2015-09-06 ENCOUNTER — Telehealth: Payer: Self-pay

## 2015-09-06 NOTE — Telephone Encounter (Signed)
-----   Message from Arnoldo Morale, MD sent at 08/30/2015  2:27 PM EDT ----- Normally. However thyroid panel is elevated but I will make no changes given she had been out of her medications prior to lab work

## 2015-09-06 NOTE — Telephone Encounter (Signed)
Writer called patient through [employer] and LVM that our office is trying to get in touch with patient to give her test results.  Call back number provided.

## 2015-09-12 ENCOUNTER — Telehealth: Payer: Self-pay

## 2015-09-12 NOTE — Telephone Encounter (Signed)
-----   Message from Arnoldo Morale, MD sent at 08/30/2015  2:27 PM EDT ----- Normally. However thyroid panel is elevated but I will make no changes given she had been out of her medications prior to lab work

## 2015-09-12 NOTE — Telephone Encounter (Signed)
Call placed to [employer], rep ID [phone] assisted with translation of call.   Message left for patient requesting return call to [facility].  When patient calls advise per Dr. Jarold Song:  Normally. However thyroid panel is elevated but I will make no changes given she had been out of her medications prior to lab work   Priscille Heidelberg, Therapist, sports, BSN

## 2015-09-15 ENCOUNTER — Telehealth: Payer: Self-pay

## 2015-09-15 NOTE — Telephone Encounter (Signed)
-----   Message from Arnoldo Morale, MD sent at 08/30/2015  2:27 PM EDT ----- Normally. However thyroid panel is elevated but I will make no changes given she had been out of her medications prior to lab work

## 2015-09-15 NOTE — Telephone Encounter (Signed)
Writer called patient through [employer] and LVM on her mobile phone letting her know that her labs were normal with the exception of her thyroid level however given that she has not been on her medication MD will continue with same dose.  Patient encouraged to call back with any questions.

## 2015-10-16 MED FILL — ?METFORMIN HCL 1,000 MG TAB: 1000 | 30 days supply | Qty: 60 | Fill #1

## 2015-10-16 MED FILL — PIOGLITAZONE HCL 45 MG TAB: 45 | 30 days supply | Qty: 30 | Fill #1

## 2015-10-16 MED FILL — ?LEVOTHYROXINE 125 MCG TABL: 125 | 30 days supply | Qty: 30 | Fill #1

## 2015-10-16 MED FILL — glipiZIDE XL 10 MG TB24: 10 | 30 days supply | Qty: 30 | Fill #1

## 2015-10-16 MED FILL — ?VALSARTAN-HCTZ 320-25 MG T: 320-25 | 30 days supply | Qty: 30 | Fill #1

## 2015-10-16 MED FILL — ATORVASTATIN 20 MG TABLET: 20 | 30 days supply | Qty: 30 | Fill #1

## 2015-12-05 MED FILL — metFORMIN HCL 1000 MG TABS: 1000 | 30 days supply | Qty: 60 | Fill #2

## 2015-12-05 MED FILL — ATORVASTATIN 20 MG TABLET: 20 | 30 days supply | Qty: 30 | Fill #2

## 2015-12-05 MED FILL — ?VALSARTAN-HCTZ 320-25 MG T: 320-25 | 30 days supply | Qty: 30 | Fill #2

## 2015-12-05 MED FILL — ?LEVOTHYROXINE 125 MCG TABL: 125 | 30 days supply | Qty: 30 | Fill #2

## 2015-12-05 MED FILL — glipiZIDE XL 10 MG TB24: 10 | 30 days supply | Qty: 30 | Fill #2

## 2015-12-05 MED FILL — PIOGLITAZONE HCL 45 MG TAB: 45 | 30 days supply | Qty: 30 | Fill #2

## 2016-02-05 ENCOUNTER — Other Ambulatory Visit: Payer: Self-pay | Admitting: Family Medicine

## 2016-02-05 DIAGNOSIS — IMO0002 Reserved for concepts with insufficient information to code with codable children: Secondary | ICD-10-CM

## 2016-02-05 DIAGNOSIS — E1165 Type 2 diabetes mellitus with hyperglycemia: Secondary | ICD-10-CM

## 2016-02-05 MED FILL — metFORMIN HCL 1000 MG TABS: 1000 | 30 days supply | Qty: 60 | Fill #3

## 2016-02-05 MED FILL — glipiZIDE XL 10 MG TB24: 10 | 30 days supply | Qty: 30 | Fill #3

## 2016-02-05 MED FILL — ?VALSARTAN-HCTZ 320-25 MG T: 320-25 | 30 days supply | Qty: 30 | Fill #3

## 2016-02-05 MED FILL — ATORVASTATIN 20 MG TABLET: 20 | 30 days supply | Qty: 30 | Fill #3

## 2016-02-05 MED FILL — ?LEVOTHYROXINE 125 MCG TABL: 125 | 30 days supply | Qty: 30 | Fill #3

## 2016-02-06 MED FILL — PIOGLITAZONE HCL 45 MG TAB: 45 | 30 days supply | Qty: 30 | Fill #0

## 2016-02-16 ENCOUNTER — Ambulatory Visit: Payer: Self-pay | Attending: Internal Medicine

## 2016-02-19 ENCOUNTER — Ambulatory Visit: Payer: Self-pay | Attending: Family Medicine | Admitting: Family Medicine

## 2016-02-19 ENCOUNTER — Encounter: Payer: Self-pay | Admitting: Family Medicine

## 2016-02-19 VITALS — BP 161/61 | HR 83 | Temp 97.8°F | Ht 59.5 in | Wt 170.2 lb

## 2016-02-19 DIAGNOSIS — Z79899 Other long term (current) drug therapy: Secondary | ICD-10-CM | POA: Insufficient documentation

## 2016-02-19 DIAGNOSIS — E785 Hyperlipidemia, unspecified: Secondary | ICD-10-CM | POA: Insufficient documentation

## 2016-02-19 DIAGNOSIS — Z7984 Long term (current) use of oral hypoglycemic drugs: Secondary | ICD-10-CM | POA: Insufficient documentation

## 2016-02-19 DIAGNOSIS — E1165 Type 2 diabetes mellitus with hyperglycemia: Secondary | ICD-10-CM

## 2016-02-19 DIAGNOSIS — Z7982 Long term (current) use of aspirin: Secondary | ICD-10-CM | POA: Insufficient documentation

## 2016-02-19 DIAGNOSIS — E119 Type 2 diabetes mellitus without complications: Secondary | ICD-10-CM

## 2016-02-19 DIAGNOSIS — E039 Hypothyroidism, unspecified: Secondary | ICD-10-CM

## 2016-02-19 DIAGNOSIS — Z9114 Patient's other noncompliance with medication regimen: Secondary | ICD-10-CM | POA: Insufficient documentation

## 2016-02-19 DIAGNOSIS — M7989 Other specified soft tissue disorders: Secondary | ICD-10-CM | POA: Insufficient documentation

## 2016-02-19 DIAGNOSIS — M25561 Pain in right knee: Secondary | ICD-10-CM

## 2016-02-19 DIAGNOSIS — I10 Essential (primary) hypertension: Secondary | ICD-10-CM

## 2016-02-19 DIAGNOSIS — E7800 Pure hypercholesterolemia, unspecified: Secondary | ICD-10-CM

## 2016-02-19 DIAGNOSIS — IMO0001 Reserved for inherently not codable concepts without codable children: Secondary | ICD-10-CM

## 2016-02-19 LAB — LIPID PANEL W/REFLEX DIRECT LDL
CHOLESTEROL: 206 mg/dL — AB (ref <200)
HDL: 60 mg/dL (ref >50)
LDL-Cholesterol: 115 mg/dL — ABNORMAL HIGH
Non-HDL Cholesterol (Calc): 146 mg/dL — ABNORMAL HIGH (ref <130)
Total CHOL/HDL Ratio: 3.4 Ratio (ref <5.0)
Triglycerides: 185 mg/dL — ABNORMAL HIGH (ref <150)

## 2016-02-19 LAB — TSH: TSH: 6.75 mIU/L — ABNORMAL HIGH

## 2016-02-19 LAB — COMPLETE METABOLIC PANEL WITH GFR
ALBUMIN: 4.1 g/dL (ref 3.6–5.1)
ALK PHOS: 140 U/L — AB (ref 33–130)
ALT: 13 U/L (ref 6–29)
AST: 16 U/L (ref 10–35)
BILIRUBIN TOTAL: 0.5 mg/dL (ref 0.2–1.2)
BUN: 11 mg/dL (ref 7–25)
CO2: 28 mmol/L (ref 20–31)
Calcium: 9.4 mg/dL (ref 8.6–10.4)
Chloride: 101 mmol/L (ref 98–110)
Creat: 0.74 mg/dL (ref 0.50–0.99)
GFR, EST NON AFRICAN AMERICAN: 83 mL/min (ref >=60)
GFR, Est African American: >89 mL/min (ref >=60)
GLUCOSE: 123 mg/dL — AB (ref 65–99)
POTASSIUM: 4.3 mmol/L (ref 3.5–5.3)
SODIUM: 139 mmol/L (ref 135–146)
Total Protein: 7.6 g/dL (ref 6.1–8.1)

## 2016-02-19 LAB — POCT GLYCOSYLATED HEMOGLOBIN (HGB A1C): Hemoglobin A1C: 9

## 2016-02-19 LAB — GLUCOSE, POCT (MANUAL RESULT ENTRY): POC Glucose: 129 mg/dl — AB (ref 70–99)

## 2016-02-19 MED ORDER — GLIPIZIDE ER 10 MG PO TB24: 10.0000 mg | ORAL_TABLET | Freq: Every day | ORAL | 3 refills | Status: DC

## 2016-02-19 MED ORDER — ATORVASTATIN CALCIUM 20 MG PO TABS: 20.0000 mg | ORAL_TABLET | Freq: Every day | ORAL | 3 refills | Status: DC

## 2016-02-19 MED ORDER — AMLODIPINE BESYLATE 10 MG PO TABS: 10.0000 mg | ORAL_TABLET | Freq: Every day | ORAL | 3 refills | Status: DC

## 2016-02-19 MED ORDER — PIOGLITAZONE HCL 45 MG PO TABS: 45.0000 mg | ORAL_TABLET | Freq: Every day | ORAL | 3 refills | Status: DC

## 2016-02-19 MED ORDER — VALSARTAN-HYDROCHLOROTHIAZIDE 320-25 MG PO TABS: 1.0000 | ORAL_TABLET | Freq: Every day | ORAL | 4 refills | Status: DC

## 2016-02-19 MED ORDER — MELOXICAM 7.5 MG PO TABS: 7.5000 mg | ORAL_TABLET | Freq: Every day | ORAL | 1 refills | Status: DC

## 2016-02-19 MED ORDER — METFORMIN HCL 1000 MG PO TABS: 1000.0000 mg | ORAL_TABLET | Freq: Two times a day (BID) | ORAL | 3 refills | Status: DC

## 2016-02-19 MED ORDER — LEVOTHYROXINE SODIUM 125 MCG PO TABS: 125.0000 ug | ORAL_TABLET | Freq: Every day | ORAL | 3 refills | Status: DC

## 2016-02-19 MED FILL — MELOXICAM 7.5 MG TABLET: 7.5 | 30 days supply | Qty: 30 | Fill #0

## 2016-02-19 MED FILL — AMLODIPINE BESYLATE 10 MG T: 10 | 30 days supply | Qty: 30 | Fill #0

## 2016-02-19 NOTE — Progress Notes (Signed)
Subjective:  Patient ID: Linda House, female    DOB: September 15, 1945  Age: 71 y.o. MRN: [id number]  CC: Diabetes and Hypertension   HPI Linda House is a 71 year old female with a history of uncontrolled type 2 diabetes mellitus (A1c 9.0), hypothyroidism, hyperlipidemia, hypertension who comes in for a follow-up visit  She has not been compliant with her medications as she recently moved and has been unable to find them ;she denies headaches, chest pains, shortness of breath. She does not exercise does not adhere to low-cholesterol, low-sodium diabetic diet.  Denies hypoglycemic episodes has no numbness in extremities.  She complains of pain in her right knee and associated swelling; pain is worse with going up and down the stairs and she has not used any OTC analgesics for this.  Past Medical History:  Diagnosis Date  . Diabetes mellitus   . Diabetes mellitus without complication ([place])   . Hypertension     History reviewed. No pertinent surgical history.  No Known Allergies   Outpatient Medications Prior to Visit  Medication Sig Dispense Refill  . aspirin EC 81 MG tablet Take 1 tablet (81 mg total) by mouth daily. 30 tablet 3  . glucose blood test strip Use as instructed 100 each 12  . glucose monitoring kit (FREESTYLE) monitoring kit 1 each by Does not apply route as needed. 1 each 0  . Lancets (FREESTYLE) lancets Use as instructed 100 each 12  . atorvastatin (LIPITOR) 20 MG tablet Take 1 tablet (20 mg total) by mouth daily. 30 tablet 3  . glipiZIDE (GLUCOTROL XL) 10 MG 24 hr tablet Take 1 tablet (10 mg total) by mouth daily. 30 tablet 3  . levothyroxine (SYNTHROID, LEVOTHROID) 125 MCG tablet Take 1 tablet (125 mcg total) by mouth daily. 30 tablet 3  . metFORMIN (GLUCOPHAGE) 1000 MG tablet Take 1 tablet (1,000 mg total) by mouth 2 (two) times daily with a meal. 60 tablet 3  . pioglitazone (ACTOS) 45 MG tablet TAKE 1 TABLET BY MOUTH DAILY. 30 tablet 0    . valsartan-hydrochlorothiazide (DIOVAN-HCT) 320-25 MG tablet Take 1 tablet by mouth daily. 30 tablet 4  . amLODipine (NORVASC) 10 MG tablet Take 1 tablet (10 mg total) by mouth daily. (Patient not taking: Reported on 02/19/2016) 30 tablet 3   No facility-administered medications prior to visit.     ROS Review of Systems  Constitutional: Negative for activity change, appetite change and fatigue.  HENT: Negative for congestion, sinus pressure and sore throat.   Eyes: Negative for visual disturbance.  Respiratory: Negative for cough, chest tightness, shortness of breath and wheezing.   Cardiovascular: Negative for chest pain and palpitations.  Gastrointestinal: Negative for abdominal distention, abdominal pain and constipation.  Endocrine: Negative for polydipsia.  Genitourinary: Negative for dysuria and frequency.  Musculoskeletal:       See hpi  Skin: Negative for rash.  Neurological: Negative for tremors, light-headedness and numbness.  Hematological: Does not bruise/bleed easily.  Psychiatric/Behavioral: Negative for agitation and behavioral problems.    Objective:  BP (!) 161/61 (BP Location: Right Arm, Patient Position: Sitting, Cuff Size: Small)   Pulse 83   Temp 97.8 F (36.6 C) (Oral)   Ht 4' 11.5" (1.511 m)   Wt 170 lb 3.2 oz (77.2 kg)   SpO2 96%   BMI 33.80 kg/m   BP/Weight 02/19/2016 08/11/2015 14/04/3152  Systolic BP 008 676 195  Diastolic BP 61 80 81  Wt. (Lbs) 170.2 171.6 -  BMI 33.8 34.66 -  Physical Exam  Constitutional: She is oriented to person, place, and time. She appears well-developed and well-nourished.  Cardiovascular: Normal rate, normal heart sounds and intact distal pulses.   No murmur heard. Pulmonary/Chest: Effort normal and breath sounds normal. She has no wheezes. She has no rales. She exhibits no tenderness.  Abdominal: Soft. Bowel sounds are normal. She exhibits no distension and no mass. There is no tenderness.  Musculoskeletal: She  exhibits edema (crepitus on range of motion of knees bilaterally). She exhibits no tenderness.  Mild R knee edema.  Neurological: She is alert and oriented to person, place, and time.      Lab Results  Component Value Date   HGBA1C 9.0 02/19/2016    Lipid Panel     Component Value Date/Time   CHOL 175 08/29/2015 0849   TRIG 127 08/29/2015 0849   HDL 61 08/29/2015 0849   CHOLHDL 2.9 08/29/2015 0849   VLDL 25 08/29/2015 0849   LDLCALC 89 08/29/2015 0849    Assessment & Plan:   1. Diabetes mellitus without complication ([place]) Uncontrolled with A1c of 9.0 due to noncompliance Strongly emphasized compliance with medications and a diabetic diet - Glucose (CBG) - HgB A1c  2. Pure hypercholesterolemia Controlled - atorvastatin (LIPITOR) 20 MG tablet; Take 1 tablet (20 mg total) by mouth daily.  Dispense: 30 tablet; Refill: 3 - Lipid Panel w/reflex Direct LDL  3. Essential hypertension, benign Uncontrolled due to not taking antihypertensive this morning - amLODipine (NORVASC) 10 MG tablet; Take 1 tablet (10 mg total) by mouth daily.  Dispense: 30 tablet; Refill: 3 - valsartan-hydrochlorothiazide (DIOVAN-HCT) 320-25 MG tablet; Take 1 tablet by mouth daily.  Dispense: 30 tablet; Refill: 4  4. Uncontrolled type 2 diabetes mellitus without complication, without long-term current use of insulin ([facility]) - amLODipine (NORVASC) 10 MG tablet; Take 1 tablet (10 mg total) by mouth daily.  Dispense: 30 tablet; Refill: 3 - glipiZIDE (GLUCOTROL XL) 10 MG 24 hr tablet; Take 1 tablet (10 mg total) by mouth daily.  Dispense: 30 tablet; Refill: 3 - metFORMIN (GLUCOPHAGE) 1000 MG tablet; Take 1 tablet (1,000 mg total) by mouth 2 (two) times daily with a meal.  Dispense: 60 tablet; Refill: 3 - pioglitazone (ACTOS) 45 MG tablet; Take 1 tablet (45 mg total) by mouth daily.  Dispense: 30 tablet; Refill: 3 - COMPLETE METABOLIC PANEL WITH GFR - Microalbumin / creatinine urine ratio  5. Hypothyroidism,  unspecified type If TSH is abnormal I will make no changes to regimen that she has not been compliant with medications. - levothyroxine (SYNTHROID, LEVOTHROID) 125 MCG tablet; Take 1 tablet (125 mcg total) by mouth daily.  Dispense: 30 tablet; Refill: 3 - TSH  6. Right knee pain Placed on meloxicam Possibly underlying osteoarthritis  Meds ordered this encounter  Medications  . amLODipine (NORVASC) 10 MG tablet    Sig: Take 1 tablet (10 mg total) by mouth daily.    Dispense:  30 tablet    Refill:  3  . glipiZIDE (GLUCOTROL XL) 10 MG 24 hr tablet    Sig: Take 1 tablet (10 mg total) by mouth daily.    Dispense:  30 tablet    Refill:  3  . metFORMIN (GLUCOPHAGE) 1000 MG tablet    Sig: Take 1 tablet (1,000 mg total) by mouth 2 (two) times daily with a meal.    Dispense:  60 tablet    Refill:  3  . atorvastatin (LIPITOR) 20 MG tablet    Sig: Take 1 tablet (  20 mg total) by mouth daily.    Dispense:  30 tablet    Refill:  3  . valsartan-hydrochlorothiazide (DIOVAN-HCT) 320-25 MG tablet    Sig: Take 1 tablet by mouth daily.    Dispense:  30 tablet    Refill:  4  . pioglitazone (ACTOS) 45 MG tablet    Sig: Take 1 tablet (45 mg total) by mouth daily.    Dispense:  30 tablet    Refill:  3  . levothyroxine (SYNTHROID, LEVOTHROID) 125 MCG tablet    Sig: Take 1 tablet (125 mcg total) by mouth daily.    Dispense:  30 tablet    Refill:  3    Follow-up: Return in about 3 months (around 05/18/2016) for Follow-up of diabetes mellitus.   Arnoldo Morale MD

## 2016-02-19 NOTE — Progress Notes (Signed)
Refills on meds

## 2016-02-20 LAB — MICROALBUMIN / CREATININE URINE RATIO
CREATININE, URINE: 296 mg/dL (ref 20–320)
Microalb Creat Ratio: 5 mcg/mg creat (ref <30)
Microalb, Ur: 1.6 mg/dL

## 2016-03-12 ENCOUNTER — Telehealth: Payer: Self-pay

## 2016-03-12 NOTE — Telephone Encounter (Signed)
-----   Message from [place], [place] sent at 03/12/2016  2:15 PM EST ----- Please advise the patient to take her cholesterol and thyroid medication as prescribed. Patients labs are elevated due to noncompliance.

## 2016-03-12 NOTE — Telephone Encounter (Signed)
Pt. Returned call. Please f/u Pt. Needs interpreter

## 2016-03-12 NOTE — Telephone Encounter (Signed)
CMA call to go over lab results  Patient did not answer, but CMA left a VM stating the reason of the call & to call me back

## 2016-03-14 NOTE — Telephone Encounter (Signed)
Patient came in to the office   CMA explain & inform her about the results   Patient was aware and did understood

## 2016-04-17 MED FILL — PIOGLITAZONE HCL 45 MG TAB: 45 | 30 days supply | Qty: 30 | Fill #0

## 2016-04-17 MED FILL — ?METFORMIN HCL 1,000 MG TAB: 1000 | 30 days supply | Qty: 60 | Fill #0

## 2016-04-17 MED FILL — ATORVASTATIN 20 MG TABLET: 20 | 30 days supply | Qty: 30 | Fill #0

## 2016-04-17 MED FILL — LEVOTHYROXINE 125 MCG TAB: 125 | 30 days supply | Qty: 30 | Fill #0

## 2016-04-17 MED FILL — AMLODIPINE BESYLATE 10 MG T: 10 | 30 days supply | Qty: 30 | Fill #1

## 2016-04-17 MED FILL — glipiZIDE XL 10 MG TB24: 10 | 30 days supply | Qty: 30 | Fill #0

## 2016-04-17 MED FILL — ?VALSARTAN-HCTZ 320-25 MG T: 320-25 | 30 days supply | Qty: 30 | Fill #0

## 2016-04-17 MED FILL — MELOXICAM 7.5 MG TABLET: 7.5 | 30 days supply | Qty: 30 | Fill #1

## 2016-06-06 MED FILL — PIOGLITAZONE HCL 45 MG TAB: 45 | 30 days supply | Qty: 30 | Fill #1

## 2016-06-06 MED FILL — ?VALSARTAN-HCTZ 320-25 MG T: 320-25 | 30 days supply | Qty: 30 | Fill #1

## 2016-06-06 MED FILL — LEVOTHYROXINE 125 MCG TAB: 125 | 30 days supply | Qty: 30 | Fill #1

## 2016-06-06 MED FILL — ATORVASTATIN 20 MG TABLET: 20 | 30 days supply | Qty: 30 | Fill #1

## 2016-06-06 MED FILL — glipiZIDE XL 10 MG TB24: 10 | 30 days supply | Qty: 30 | Fill #1

## 2016-06-06 MED FILL — ?AMLODIPINE BESYLATE 10 MG: 10 | 30 days supply | Qty: 30 | Fill #2

## 2016-06-06 MED FILL — ?METFORMIN HCL 1,000 MG TAB: 1000 | 30 days supply | Qty: 60 | Fill #1

## 2016-07-22 ENCOUNTER — Other Ambulatory Visit: Payer: Self-pay | Admitting: Family Medicine

## 2016-07-22 DIAGNOSIS — I10 Essential (primary) hypertension: Secondary | ICD-10-CM

## 2016-07-22 DIAGNOSIS — IMO0001 Reserved for inherently not codable concepts without codable children: Secondary | ICD-10-CM

## 2016-07-22 DIAGNOSIS — E1165 Type 2 diabetes mellitus with hyperglycemia: Secondary | ICD-10-CM

## 2016-07-22 MED FILL — ?AMLODIPINE BESYLATE 10 MG: 10 | 30 days supply | Qty: 30 | Fill #3

## 2016-07-22 MED FILL — ?METFORMIN HCL 1,000 MG TAB: 1000 | 30 days supply | Qty: 60 | Fill #2

## 2016-07-22 MED FILL — PIOGLITAZONE HCL 45 MG TAB: 45 | 30 days supply | Qty: 30 | Fill #2

## 2016-07-22 MED FILL — ?VALSARTAN-HCTZ 320-25 MG T: 320-25 | 30 days supply | Qty: 30 | Fill #2

## 2016-07-22 MED FILL — ?LEVOTHYROXINE 125 MCG TABL: 125 | 30 days supply | Qty: 30 | Fill #2

## 2016-07-22 MED FILL — glipiZIDE XL 10 MG TB24: 10 | 30 days supply | Qty: 30 | Fill #2

## 2016-07-22 MED FILL — ?ATORVASTATIN 20 MG TABLET: 20 | 30 days supply | Qty: 30 | Fill #2

## 2016-08-27 ENCOUNTER — Telehealth: Payer: Self-pay | Admitting: Family Medicine

## 2016-08-27 ENCOUNTER — Ambulatory Visit: Payer: Self-pay | Attending: Family Medicine | Admitting: Family Medicine

## 2016-08-27 ENCOUNTER — Encounter: Payer: Self-pay | Admitting: Family Medicine

## 2016-08-27 VITALS — BP 142/71 | HR 83 | Temp 98.0°F | Wt 166.8 lb

## 2016-08-27 DIAGNOSIS — Z79899 Other long term (current) drug therapy: Secondary | ICD-10-CM | POA: Insufficient documentation

## 2016-08-27 DIAGNOSIS — M1712 Unilateral primary osteoarthritis, left knee: Secondary | ICD-10-CM | POA: Insufficient documentation

## 2016-08-27 DIAGNOSIS — S025XXA Fracture of tooth (traumatic), initial encounter for closed fracture: Secondary | ICD-10-CM

## 2016-08-27 DIAGNOSIS — E119 Type 2 diabetes mellitus without complications: Secondary | ICD-10-CM

## 2016-08-27 DIAGNOSIS — Z7982 Long term (current) use of aspirin: Secondary | ICD-10-CM | POA: Insufficient documentation

## 2016-08-27 DIAGNOSIS — IMO0001 Reserved for inherently not codable concepts without codable children: Secondary | ICD-10-CM

## 2016-08-27 DIAGNOSIS — Z9114 Patient's other noncompliance with medication regimen: Secondary | ICD-10-CM | POA: Insufficient documentation

## 2016-08-27 DIAGNOSIS — E1165 Type 2 diabetes mellitus with hyperglycemia: Secondary | ICD-10-CM | POA: Insufficient documentation

## 2016-08-27 DIAGNOSIS — Z7984 Long term (current) use of oral hypoglycemic drugs: Secondary | ICD-10-CM | POA: Insufficient documentation

## 2016-08-27 DIAGNOSIS — Z1159 Encounter for screening for other viral diseases: Secondary | ICD-10-CM | POA: Insufficient documentation

## 2016-08-27 DIAGNOSIS — E785 Hyperlipidemia, unspecified: Secondary | ICD-10-CM | POA: Insufficient documentation

## 2016-08-27 DIAGNOSIS — I10 Essential (primary) hypertension: Secondary | ICD-10-CM | POA: Insufficient documentation

## 2016-08-27 DIAGNOSIS — Z23 Encounter for immunization: Secondary | ICD-10-CM

## 2016-08-27 DIAGNOSIS — E7800 Pure hypercholesterolemia, unspecified: Secondary | ICD-10-CM | POA: Insufficient documentation

## 2016-08-27 DIAGNOSIS — E039 Hypothyroidism, unspecified: Secondary | ICD-10-CM | POA: Insufficient documentation

## 2016-08-27 LAB — GLUCOSE, POCT (MANUAL RESULT ENTRY): POC Glucose: 159 mg/dl — AB (ref 70–99)

## 2016-08-27 LAB — POCT GLYCOSYLATED HEMOGLOBIN (HGB A1C): HEMOGLOBIN A1C: 9.4

## 2016-08-27 MED ORDER — MELOXICAM 7.5 MG PO TABS: 7.5000 mg | ORAL_TABLET | Freq: Every day | ORAL | 3 refills | Status: DC

## 2016-08-27 MED ORDER — LISINOPRIL-HYDROCHLOROTHIAZIDE 20-25 MG PO TABS: 1.0000 | ORAL_TABLET | Freq: Every day | ORAL | 3 refills | Status: DC

## 2016-08-27 MED ORDER — PIOGLITAZONE HCL 45 MG PO TABS: 45.0000 mg | ORAL_TABLET | Freq: Every day | ORAL | 3 refills | Status: DC

## 2016-08-27 MED ORDER — METFORMIN HCL 1000 MG PO TABS: 1000.0000 mg | ORAL_TABLET | Freq: Two times a day (BID) | ORAL | 3 refills | Status: DC

## 2016-08-27 MED ORDER — AMLODIPINE BESYLATE 10 MG PO TABS: 10.0000 mg | ORAL_TABLET | Freq: Every day | ORAL | 3 refills | Status: DC

## 2016-08-27 MED ORDER — ATORVASTATIN CALCIUM 20 MG PO TABS: 20.0000 mg | ORAL_TABLET | Freq: Every day | ORAL | 3 refills | Status: DC

## 2016-08-27 MED ORDER — GLIPIZIDE ER 10 MG PO TB24: 20.0000 mg | ORAL_TABLET | Freq: Every day | ORAL | 3 refills | Status: DC

## 2016-08-27 MED FILL — MELOXICAM 7.5 MG TABLET: 7.5 | 30 days supply | Qty: 30 | Fill #0

## 2016-08-27 MED FILL — AMLODIPINE BESYLATE 10 MG T: 10 | 30 days supply | Qty: 30 | Fill #0

## 2016-08-27 MED FILL — PIOGLITAZONE HCL 45 MG TAB: 45 | 30 days supply | Qty: 30 | Fill #0

## 2016-08-27 MED FILL — LISINOPRIL-HCTZ 20-25 MG TA: 20-25 | 30 days supply | Qty: 30 | Fill #0

## 2016-08-27 MED FILL — glipiZIDE ER 10 MG TB24: 10 | 30 days supply | Qty: 60 | Fill #0

## 2016-08-27 MED FILL — ?ATORVASTATIN 20 MG TABLET: 20 | 30 days supply | Qty: 30 | Fill #0

## 2016-08-27 MED FILL — ?METFORMIN HCL 1,000 MG TAB: 1000 | 30 days supply | Qty: 60 | Fill #0

## 2016-08-27 NOTE — Telephone Encounter (Signed)
Referral placed.

## 2016-08-27 NOTE — Progress Notes (Signed)
 Subjective:  Patient ID: Linda House, female    DOB: 12/22/1945  Age: 71 y.o. MRN: [id number]  CC: Diabetes and Hypertension   HPI Linda House  is a 71-year-old female with a history of uncontrolled type 2 diabetes mellitus (A1c 9.4), hypothyroidism, hyperlipidemia, hypertension who comes in for a follow-up visit  She has not been compliant with her antihypertensives as she informs me that TV commercial revealed one of her antihypertensive cuses cancer ; she endorses compliance with her diabetic, thyroid, cholesterol medications.  She denies headaches, chest pains, shortness of breath. She does not exercise does not adhere to low-cholesterol, low-sodium diabetic diet.  Denies hypoglycemic episodes has no numbness in extremities.  She has chronic pain in both knees due to arthritis and has been taking her meloxicam.  Past Medical History:  Diagnosis Date  . Diabetes mellitus   . Diabetes mellitus without complication (HCC)   . Hypertension     No past surgical history on file.  No Known Allergies   Outpatient Medications Prior to Visit  Medication Sig Dispense Refill  . aspirin EC 81 MG tablet Take 1 tablet (81 mg total) by mouth daily. 30 tablet 3  . glucose blood test strip Use as instructed 100 each 12  . glucose monitoring kit (FREESTYLE) monitoring kit 1 each by Does not apply route as needed. 1 each 0  . Lancets (FREESTYLE) lancets Use as instructed 100 each 12  . levothyroxine (SYNTHROID, LEVOTHROID) 125 MCG tablet Take 1 tablet (125 mcg total) by mouth daily. 30 tablet 3  . amLODipine (NORVASC) 10 MG tablet Take 1 tablet (10 mg total) by mouth daily. 30 tablet 3  . atorvastatin (LIPITOR) 20 MG tablet Take 1 tablet (20 mg total) by mouth daily. 30 tablet 3  . glipiZIDE (GLUCOTROL XL) 10 MG 24 hr tablet Take 1 tablet (10 mg total) by mouth daily. 30 tablet 3  . meloxicam (MOBIC) 7.5 MG tablet Take 1 tablet (7.5 mg total) by mouth daily. 30  tablet 1  . metFORMIN (GLUCOPHAGE) 1000 MG tablet Take 1 tablet (1,000 mg total) by mouth 2 (two) times daily with a meal. 60 tablet 3  . pioglitazone (ACTOS) 45 MG tablet Take 1 tablet (45 mg total) by mouth daily. 30 tablet 3  . valsartan-hydrochlorothiazide (DIOVAN-HCT) 320-25 MG tablet Take 1 tablet by mouth daily. (Patient not taking: Reported on 08/27/2016) 30 tablet 4   No facility-administered medications prior to visit.     ROS Review of Systems Constitutional: Negative for activity change, appetite change and fatigue.  HENT: Negative for congestion, sinus pressure and sore throat.   Eyes: Negative for visual disturbance.  Respiratory: Negative for cough, chest tightness, shortness of breath and wheezing.   Cardiovascular: Negative for chest pain and palpitations.  Gastrointestinal: Negative for abdominal distention, abdominal pain and constipation.  Endocrine: Negative for polydipsia.  Genitourinary: Negative for dysuria and frequency.  Musculoskeletal:       See hpi  Skin: Negative for rash.  Neurological: Negative for tremors, light-headedness and numbness.  Hematological: Does not bruise/bleed easily.  Psychiatric/Behavioral: Negative for agitation and behavioral problems.  Objective:  BP (!) 142/71   Pulse 83   Temp 98 F (36.7 C) (Oral)   Wt 166 lb 12.8 oz (75.7 kg)   SpO2 96%   BMI 33.13 kg/m   BP/Weight 08/27/2016 02/19/2016 08/11/2015  Systolic BP 142 161 175  Diastolic BP 71 61 80  Wt. (Lbs) 166.8 170.2 171.6  BMI 33.13 33.8   34.66      Physical Exam Constitutional: She is oriented to person, place, and time. She appears well-developed and well-nourished.  Cardiovascular: Normal rate, normal heart sounds and intact distal pulses.   No murmur heard. Pulmonary/Chest: Effort normal and breath sounds normal. She has no wheezes. She has no rales. She exhibits no tenderness.  Abdominal: Soft. Bowel sounds are normal. She exhibits no distension and no mass.  There is no tenderness.  Musculoskeletal: She exhibits edema (crepitus on range of motion of knees bilaterally). She exhibits no tenderness.  Mild R knee edema.  Neurological: She is alert and oriented to person, place, and time.  Psych: normal  CMP Latest Ref Rng & Units 02/19/2016 08/29/2015 09/30/2014  Glucose 65 - 99 mg/dL 123(H) 130(H) 76  BUN 7 - 25 mg/dL 11 12 16  Creatinine 0.50 - 0.99 mg/dL 0.74 0.58 0.74  Sodium 135 - 146 mmol/L 139 139 140  Potassium 3.5 - 5.3 mmol/L 4.3 4.3 5.1  Chloride 98 - 110 mmol/L 101 104 102  CO2 20 - 31 mmol/L 28 26 28  Calcium 8.6 - 10.4 mg/dL 9.4 9.0 9.3  Total Protein 6.1 - 8.1 g/dL 7.6 7.2 -  Total Bilirubin 0.2 - 1.2 mg/dL 0.5 0.4 -  Alkaline Phos 33 - 130 U/L 140(H) 138(H) -  AST 10 - 35 U/L 16 13 -  ALT 6 - 29 U/L 13 14 -    Lipid Panel     Component Value Date/Time   CHOL 206 (H) 02/19/2016 1021   TRIG 185 (H) 02/19/2016 1021   HDL 60 02/19/2016 1021   CHOLHDL 3.4 02/19/2016 1021   VLDL 25 08/29/2015 0849   LDLCALC 89 08/29/2015 0849    Lab Results  Component Value Date   TSH 6.75 (H) 02/19/2016   Lab Results  Component Value Date   HGBA1C 9.4 08/27/2016     Assessment & Plan:   1. Uncontrolled type 2 diabetes mellitus without complication, without long-term current use of insulin (HCC) Uncontrolled with A1c of 9.4 Increased dose of glipizide Diabetic diet If still uncontrolled at next visit we'll add insulin - POCT glucose (manual entry) - POCT glycosylated hemoglobin (Hb A1C) - glipiZIDE (GLUCOTROL XL) 10 MG 24 hr tablet; Take 2 tablets (20 mg total) by mouth daily.  Dispense: 60 tablet; Refill: 3 - pioglitazone (ACTOS) 45 MG tablet; Take 1 tablet (45 mg total) by mouth daily.  Dispense: 30 tablet; Refill: 3 - amLODipine (NORVASC) 10 MG tablet; Take 1 tablet (10 mg total) by mouth daily.  Dispense: 30 tablet; Refill: 3 - metFORMIN (GLUCOPHAGE) 1000 MG tablet; Take 1 tablet (1,000 mg total) by mouth 2 (two) times daily  with a meal.  Dispense: 60 tablet; Refill: 3 - CMP14+EGFR - CBC with Differential/Platelet  2. Pure hypercholesterolemia Controlled - atorvastatin (LIPITOR) 20 MG tablet; Take 1 tablet (20 mg total) by mouth daily.  Dispense: 30 tablet; Refill: 3  3. Hypothyroidism, unspecified type Uncontrolled We'll refill her levothyroxine after TSH is obtained - TSH  4. Essential hypertension, benign Uncontrolled due to noncompliance with antihypertensive Discontinued losartan/hydrochlorothiazide and substitutedwith lisinopril/hydrochlorothiazide Continue amlodipine - amLODipine (NORVASC) 10 MG tablet; Take 1 tablet (10 mg total) by mouth daily.  Dispense: 30 tablet; Refill: 3 - lisinopril-hydrochlorothiazide (PRINZIDE,ZESTORETIC) 20-25 MG tablet; Take 1 tablet by mouth daily.  Dispense: 30 tablet; Refill: 3  5. Need for hepatitis C screening test - Hepatitis c antibody (reflex)  6. Primary osteoarthritis of left knee Stable - meloxicam (MOBIC) 7.5   MG tablet; Take 1 tablet (7.5 mg total) by mouth daily.  Dispense: 30 tablet; Refill: 3  7. Need for pneumococcal vaccination   Meds ordered this encounter  Medications  . glipiZIDE (GLUCOTROL XL) 10 MG 24 hr tablet    Sig: Take 2 tablets (20 mg total) by mouth daily.    Dispense:  60 tablet    Refill:  3    Discontinue previous dose  . meloxicam (MOBIC) 7.5 MG tablet    Sig: Take 1 tablet (7.5 mg total) by mouth daily.    Dispense:  30 tablet    Refill:  3  . pioglitazone (ACTOS) 45 MG tablet    Sig: Take 1 tablet (45 mg total) by mouth daily.    Dispense:  30 tablet    Refill:  3  . atorvastatin (LIPITOR) 20 MG tablet    Sig: Take 1 tablet (20 mg total) by mouth daily.    Dispense:  30 tablet    Refill:  3  . amLODipine (NORVASC) 10 MG tablet    Sig: Take 1 tablet (10 mg total) by mouth daily.    Dispense:  30 tablet    Refill:  3  . metFORMIN (GLUCOPHAGE) 1000 MG tablet    Sig: Take 1 tablet (1,000 mg total) by mouth 2 (two)  times daily with a meal.    Dispense:  60 tablet    Refill:  3  . lisinopril-hydrochlorothiazide (PRINZIDE,ZESTORETIC) 20-25 MG tablet    Sig: Take 1 tablet by mouth daily.    Dispense:  30 tablet    Refill:  3    Follow-up: Return in about 3 months (around 11/27/2016) for follow up of Diabetes mellitus.   This note has been created with Dragon speech recognition software and smart phrase technology. Any transcriptional errors are unintentional.     Enobong Amao MD   

## 2016-08-27 NOTE — Telephone Encounter (Signed)
Pt saw the PCP today and she forgot to ask for a referral for the dentist since she has 2 tooth broken and need pull the remeinding of them out, please need this since she is having a lot of pain, please follow up

## 2016-08-27 NOTE — Telephone Encounter (Signed)
Could you put this referral in and I will call the pt.

## 2016-08-27 NOTE — Patient Instructions (Signed)
Pneumococcal Conjugate Vaccine (PCV13) What You Need to Know 1. Why get vaccinated? Vaccination can protect both children and adults from pneumococcal disease. Pneumococcal disease is caused by bacteria that can spread from person to person through close contact. It can cause ear infections, and it can also lead to more serious infections of the:  Lungs (pneumonia),  Blood (bacteremia), and  Covering of the brain and spinal cord (meningitis).  Pneumococcal pneumonia is most common among adults. Pneumococcal meningitis can cause deafness and brain damage, and it kills about 1 child in 10 who get it. Anyone can get pneumococcal disease, but children under 2 years of age and adults 65 years and older, people with certain medical conditions, and cigarette smokers are at the highest risk. Before there was a vaccine, the [place] saw:  more than 700 cases of meningitis,  about 13,000 blood infections,  about 5 million ear infections, and  about 200 deaths  in children under 5 each year from pneumococcal disease. Since vaccine became available, severe pneumococcal disease in these children has fallen by 88%. About 18,000 older adults die of pneumococcal disease each year in the [place]. Treatment of pneumococcal infections with penicillin and other drugs is not as effective as it used to be, because some strains of the disease have become resistant to these drugs. This makes prevention of the disease, through vaccination, even more important. 2. PCV13 vaccine Pneumococcal conjugate vaccine (called PCV13) protects against 13 types of pneumococcal bacteria. PCV13 is routinely given to children at 2, 4, 6, and 12-15 months of age. It is also recommended for children and adults 2 to 64 years of age with certain health conditions, and for all adults 65 years of age and older. Your doctor can give you details. 3. Some people should not get this vaccine Anyone who has ever had a  life-threatening allergic reaction to a dose of this vaccine, to an earlier pneumococcal vaccine called PCV7, or to any vaccine containing diphtheria toxoid (for example, DTaP), should not get PCV13. Anyone with a severe allergy to any component of PCV13 should not get the vaccine. Tell your doctor if the person being vaccinated has any severe allergies. If the person scheduled for vaccination is not feeling well, your healthcare provider might decide to reschedule the shot on another day. 4. Risks of a vaccine reaction With any medicine, including vaccines, there is a chance of reactions. These are usually mild and go away on their own, but serious reactions are also possible. Problems reported following PCV13 varied by age and dose in the series. The most common problems reported among children were:  About half became drowsy after the shot, had a temporary loss of appetite, or had redness or tenderness where the shot was given.  About 1 out of 3 had swelling where the shot was given.  About 1 out of 3 had a mild fever, and about 1 in 20 had a fever over 102.2F.  Up to about 8 out of 10 became fussy or irritable.  Adults have reported pain, redness, and swelling where the shot was given; also mild fever, fatigue, headache, chills, or muscle pain. Young children who get PCV13 along with inactivated flu vaccine at the same time may be at increased risk for seizures caused by fever. Ask your doctor for more information. Problems that could happen after any vaccine:  People sometimes faint after a medical procedure, including vaccination. Sitting or lying down for about 15 minutes can help prevent   fainting, and injuries caused by a fall. Tell your doctor if you feel dizzy, or have vision changes or ringing in the ears.  Some older children and adults get severe pain in the shoulder and have difficulty moving the arm where a shot was given. This happens very rarely.  Any medication can cause a  severe allergic reaction. Such reactions from a vaccine are very rare, estimated at about 1 in a million doses, and would happen within a few minutes to a few hours after the vaccination. As with any medicine, there is a very small chance of a vaccine causing a serious injury or death. The safety of vaccines is always being monitored. For more information, visit: www.cdc.gov/vaccinesafety/ 5. What if there is a serious reaction? What should I look for? Look for anything that concerns you, such as signs of a severe allergic reaction, very high fever, or unusual behavior. Signs of a severe allergic reaction can include hives, swelling of the face and throat, difficulty breathing, a fast heartbeat, dizziness, and weakness-usually within a few minutes to a few hours after the vaccination. What should I do?  If you think it is a severe allergic reaction or other emergency that can't wait, call 9-1-1 or get the person to the nearest hospital. Otherwise, call your doctor.  Reactions should be reported to the Vaccine Adverse Event Reporting System (VAERS). Your doctor should file this report, or you can do it yourself through the VAERS web site at www.vaers.hhs.gov, or by calling 1-800-822-7967. ? VAERS does not give medical advice. 6. The National Vaccine Injury Compensation Program The National Vaccine Injury Compensation Program (VICP) is a federal program that was created to compensate people who may have been injured by certain vaccines. Persons who believe they may have been injured by a vaccine can learn about the program and about filing a claim by calling 1-800-338-2382 or visiting the VICP website at www.hrsa.gov/vaccinecompensation. There is a time limit to file a claim for compensation. 7. How can I learn more?  Ask your healthcare provider. He or she can give you the vaccine package insert or suggest other sources of information.  Call your local or state health department.  Contact the  Centers for Disease Control and Prevention (CDC): ? Call 1-800-232-4636 (1-800-CDC-INFO) or ? Visit CDC's website at www.cdc.gov/vaccines Vaccine Information Statement, PCV13 Vaccine (11/18/2013) This information is not intended to replace advice given to you by your health care provider. Make sure you discuss any questions you have with your health care provider. Document Released: 10/28/2005 Document Revised: 09/21/2015 Document Reviewed: 09/21/2015 Elsevier Interactive Patient Education  2017 Elsevier Inc.  

## 2016-08-28 ENCOUNTER — Other Ambulatory Visit: Payer: Self-pay | Admitting: Family Medicine

## 2016-08-28 DIAGNOSIS — E039 Hypothyroidism, unspecified: Secondary | ICD-10-CM

## 2016-08-28 LAB — CBC WITH DIFFERENTIAL/PLATELET
Basophils Absolute: 0 10*3/uL (ref 0.0–0.2)
Basos: 0 %
EOS (ABSOLUTE): 0.2 10*3/uL (ref 0.0–0.4)
Eos: 4 %
Hematocrit: 38.2 % (ref 34.0–46.6)
Hemoglobin: 12.2 g/dL (ref 11.1–15.9)
IMMATURE GRANULOCYTES: 0 %
Immature Grans (Abs): 0 10*3/uL (ref 0.0–0.1)
Lymphocytes Absolute: 2.5 10*3/uL (ref 0.7–3.1)
Lymphs: 39 %
MCH: 25.3 pg — ABNORMAL LOW (ref 26.6–33.0)
MCHC: 31.9 g/dL (ref 31.5–35.7)
MCV: 79 fL (ref 79–97)
MONOS ABS: 0.5 10*3/uL (ref 0.1–0.9)
Monocytes: 7 %
NEUTROS PCT: 50 %
Neutrophils Absolute: 3.2 10*3/uL (ref 1.4–7.0)
PLATELETS: 355 10*3/uL (ref 150–379)
RBC: 4.83 x10E6/uL (ref 3.77–5.28)
RDW: 15.2 % (ref 12.3–15.4)
WBC: 6.4 10*3/uL (ref 3.4–10.8)

## 2016-08-28 LAB — CMP14+EGFR
A/G RATIO: 1.2 (ref 1.2–2.2)
ALBUMIN: 4 g/dL (ref 3.5–4.8)
ALT: 10 IU/L (ref 0–32)
AST: 13 IU/L (ref 0–40)
Alkaline Phosphatase: 161 IU/L — ABNORMAL HIGH (ref 39–117)
BILIRUBIN TOTAL: 0.2 mg/dL (ref 0.0–1.2)
BUN/Creatinine Ratio: 16 (ref 12–28)
BUN: 10 mg/dL (ref 8–27)
CALCIUM: 8.7 mg/dL (ref 8.7–10.3)
CHLORIDE: 103 mmol/L (ref 96–106)
CO2: 24 mmol/L (ref 20–29)
Creatinine, Ser: 0.61 mg/dL (ref 0.57–1.00)
GFR calc Af Amer: 106 mL/min/{1.73_m2} (ref >59)
GFR, EST NON AFRICAN AMERICAN: 92 mL/min/{1.73_m2} (ref >59)
Globulin, Total: 3.4 g/dL (ref 1.5–4.5)
Glucose: 136 mg/dL — ABNORMAL HIGH (ref 65–99)
POTASSIUM: 4.5 mmol/L (ref 3.5–5.2)
Sodium: 140 mmol/L (ref 134–144)
TOTAL PROTEIN: 7.4 g/dL (ref 6.0–8.5)

## 2016-08-28 LAB — HEPATITIS C ANTIBODY (REFLEX): HCV Ab: 0.1 s/co ratio (ref 0.0–0.9)

## 2016-08-28 LAB — HCV COMMENT:

## 2016-08-28 LAB — TSH: TSH: 7.52 u[IU]/mL — ABNORMAL HIGH (ref 0.450–4.500)

## 2016-08-28 MED ORDER — LEVOTHYROXINE SODIUM 137 MCG PO TABS: 137.0000 ug | ORAL_TABLET | Freq: Every day | ORAL | 3 refills | Status: DC

## 2016-08-28 MED FILL — ?LEVOTHYROXINE 137 MCG TAB: 137 | 30 days supply | Qty: 30 | Fill #0

## 2016-08-29 ENCOUNTER — Telehealth: Payer: Self-pay

## 2016-08-29 NOTE — Telephone Encounter (Signed)
Pt was called and informed of lab results and medication change. 

## 2016-10-22 MED FILL — glipiZIDE XL 10 MG TB24: 10 | 30 days supply | Qty: 60 | Fill #1

## 2016-10-22 MED FILL — PIOGLITAZONE HCL 45 MG TAB: 45 | 30 days supply | Qty: 30 | Fill #1

## 2016-10-22 MED FILL — ?ATORVASTATIN 20 MG TABLET: 20 | 30 days supply | Qty: 30 | Fill #1

## 2016-10-22 MED FILL — ?METFORMIN HCL 1,000 MG TAB: 1000 | 30 days supply | Qty: 60 | Fill #1

## 2016-10-22 MED FILL — LISINOPRIL-HCTZ 20-25 MG TA: 20-25 | 30 days supply | Qty: 30 | Fill #1

## 2016-10-22 MED FILL — AMLODIPINE BESYLATE 10 MG T: 10 | 30 days supply | Qty: 30 | Fill #1

## 2016-10-22 MED FILL — MELOXICAM 7.5 MG TABLET: 7.5 | 30 days supply | Qty: 30 | Fill #1

## 2016-11-27 ENCOUNTER — Ambulatory Visit: Payer: Self-pay | Attending: Family Medicine | Admitting: Family Medicine

## 2016-11-27 ENCOUNTER — Other Ambulatory Visit: Payer: Self-pay

## 2016-11-27 ENCOUNTER — Encounter: Payer: Self-pay | Admitting: Family Medicine

## 2016-11-27 VITALS — BP 139/75 | HR 75 | Temp 98.0°F | Resp 16 | Wt 166.0 lb

## 2016-11-27 DIAGNOSIS — E1165 Type 2 diabetes mellitus with hyperglycemia: Secondary | ICD-10-CM | POA: Insufficient documentation

## 2016-11-27 DIAGNOSIS — E7800 Pure hypercholesterolemia, unspecified: Secondary | ICD-10-CM | POA: Insufficient documentation

## 2016-11-27 DIAGNOSIS — Z9119 Patient's noncompliance with other medical treatment and regimen: Secondary | ICD-10-CM | POA: Insufficient documentation

## 2016-11-27 DIAGNOSIS — Z79899 Other long term (current) drug therapy: Secondary | ICD-10-CM | POA: Insufficient documentation

## 2016-11-27 DIAGNOSIS — Z7984 Long term (current) use of oral hypoglycemic drugs: Secondary | ICD-10-CM | POA: Insufficient documentation

## 2016-11-27 DIAGNOSIS — E039 Hypothyroidism, unspecified: Secondary | ICD-10-CM | POA: Insufficient documentation

## 2016-11-27 DIAGNOSIS — IMO0001 Reserved for inherently not codable concepts without codable children: Secondary | ICD-10-CM

## 2016-11-27 DIAGNOSIS — I10 Essential (primary) hypertension: Secondary | ICD-10-CM | POA: Insufficient documentation

## 2016-11-27 DIAGNOSIS — Z7982 Long term (current) use of aspirin: Secondary | ICD-10-CM | POA: Insufficient documentation

## 2016-11-27 LAB — POCT CBG (FASTING - GLUCOSE)-MANUAL ENTRY: Glucose Fasting, POC: 177 mg/dL — AB (ref 70–99)

## 2016-11-27 LAB — POCT GLYCOSYLATED HEMOGLOBIN (HGB A1C): HEMOGLOBIN A1C: 9.9

## 2016-11-27 MED ORDER — GLIPIZIDE ER 10 MG PO TB24: 20.0000 mg | ORAL_TABLET | Freq: Every day | ORAL | 3 refills | Status: DC

## 2016-11-27 MED ORDER — ATORVASTATIN CALCIUM 20 MG PO TABS: 20.0000 mg | ORAL_TABLET | Freq: Every day | ORAL | 3 refills | Status: DC

## 2016-11-27 MED ORDER — LISINOPRIL-HYDROCHLOROTHIAZIDE 20-25 MG PO TABS: 1.0000 | ORAL_TABLET | Freq: Every day | ORAL | 3 refills | Status: DC

## 2016-11-27 MED ORDER — AMLODIPINE BESYLATE 10 MG PO TABS: 10.0000 mg | ORAL_TABLET | Freq: Every day | ORAL | 3 refills | Status: DC

## 2016-11-27 MED ORDER — METFORMIN HCL 1000 MG PO TABS: 1000.0000 mg | ORAL_TABLET | Freq: Two times a day (BID) | ORAL | 3 refills | Status: DC

## 2016-11-27 NOTE — Progress Notes (Signed)
Subjective:  Patient ID: Linda House, female    DOB: 10-Nov-1945  Age: 71 y.o. MRN: [id number]  CC: Diabetes mellitus  HPI Linda House is a 71 year old female with a history of uncontrolled type 2 diabetes mellitus (A1c 9.9), hypothyroidism, hyperlipidemia, hypertension who comes in for a follow-up visit  A1c is 9.9 today and was 9.4 at her last visit 3 months ago.  She informs me her fasting sugars have been in the 120-140 range.  Denies visual complaints, numbness in extremities or hypoglycemia. She has not been compliant with a diabetic diet.  She has been compliant with her antihypertensive and her statin and denies any adverse effects from the medications.  Also taking her thyroid medications but her last TSH was elevated at 7.52, three months ago. Denies chest pains or shortness of breath and has no other concerns today.  Past Medical History:  Diagnosis Date  . Diabetes mellitus   . Diabetes mellitus without complication ([place])   . Hypertension     No past surgical history on file.  No Known Allergies   Outpatient Medications Prior to Visit  Medication Sig Dispense Refill  . aspirin EC 81 MG tablet Take 1 tablet (81 mg total) by mouth daily. 30 tablet 3  . glucose blood test strip Use as instructed 100 each 12  . glucose monitoring kit (FREESTYLE) monitoring kit 1 each by Does not apply route as needed. 1 each 0  . Lancets (FREESTYLE) lancets Use as instructed 100 each 12  . levothyroxine (SYNTHROID, LEVOTHROID) 137 MCG tablet Take 1 tablet (137 mcg total) by mouth daily. 30 tablet 3  . meloxicam (MOBIC) 7.5 MG tablet Take 1 tablet (7.5 mg total) by mouth daily. 30 tablet 3  . pioglitazone (ACTOS) 45 MG tablet Take 1 tablet (45 mg total) by mouth daily. 30 tablet 3  . amLODipine (NORVASC) 10 MG tablet Take 1 tablet (10 mg total) by mouth daily. 30 tablet 3  . atorvastatin (LIPITOR) 20 MG tablet Take 1 tablet (20 mg total) by mouth daily. 30  tablet 3  . glipiZIDE (GLUCOTROL XL) 10 MG 24 hr tablet Take 2 tablets (20 mg total) by mouth daily. 60 tablet 3  . lisinopril-hydrochlorothiazide (PRINZIDE,ZESTORETIC) 20-25 MG tablet Take 1 tablet by mouth daily. 30 tablet 3  . metFORMIN (GLUCOPHAGE) 1000 MG tablet Take 1 tablet (1,000 mg total) by mouth 2 (two) times daily with a meal. 60 tablet 3   No facility-administered medications prior to visit.     ROS Review of Systems  Constitutional: Negative for activity change, appetite change and fatigue.  HENT: Negative for congestion, sinus pressure and sore throat.   Eyes: Negative for visual disturbance.  Respiratory: Negative for cough, chest tightness, shortness of breath and wheezing.   Cardiovascular: Negative for chest pain and palpitations.  Gastrointestinal: Negative for abdominal distention, abdominal pain and constipation.  Endocrine: Negative for polydipsia.  Genitourinary: Negative for dysuria and frequency.  Musculoskeletal: Negative for arthralgias and back pain.  Skin: Negative for rash.  Neurological: Negative for tremors, light-headedness and numbness.  Hematological: Does not bruise/bleed easily.  Psychiatric/Behavioral: Negative for agitation and behavioral problems.    Objective:  BP 139/75 (BP Location: Left Arm, Patient Position: Sitting, Cuff Size: Large)   Pulse 75   Temp 98 F (36.7 C) (Oral)   Resp 16   Wt 166 lb (75.3 kg)   SpO2 99%   BMI 32.97 kg/m   BP/Weight 11/27/2016 3/61/4431 05/17/84  Systolic BP 761  465 035  Diastolic BP 75 71 61  Wt. (Lbs) 166 166.8 170.2  BMI 32.97 33.13 33.8      Physical Exam  Constitutional: She is oriented to person, place, and time. She appears well-developed and well-nourished.  Cardiovascular: Normal rate, normal heart sounds and intact distal pulses.  No murmur heard. Pulmonary/Chest: Effort normal and breath sounds normal. She has no wheezes. She has no rales. She exhibits no tenderness.  Abdominal:  Soft. Bowel sounds are normal. She exhibits no distension and no mass. There is no tenderness.  Musculoskeletal: Normal range of motion.  Neurological: She is alert and oriented to person, place, and time.  Skin: Skin is warm and dry.  Psychiatric: She has a normal mood and affect.     Lab Results  Component Value Date   HGBA1C 9.9 11/27/2016    Assessment & Plan:   1. Essential Hypertension Controlled Low-sodium, DASH diet - amLODipine (NORVASC) 10 MG tablet; Take 1 tablet (10 mg total) daily by mouth.  Dispense: 30 tablet; Refill: 3 - lisinopril-hydrochlorothiazide (PRINZIDE,ZESTORETIC) 20-25 MG tablet; Take 1 tablet daily by mouth.  Dispense: 30 tablet; Refill: 3  2. Uncontrolled type 2 diabetes mellitus without complication, without long-term current use of insulin ([place]) Uncontrolled with A1c of 9.9 She is resisting initiation of insulin and is promising to work on her diet I have discussed the risks of uncontrolled diabetes mellitus and its complications Diabetic diet, lifestyle modifications - Glucose (CBG), Fasting - HgB A1c - amLODipine (NORVASC) 10 MG tablet; Take 1 tablet (10 mg total) daily by mouth.  Dispense: 30 tablet; Refill: 3 - glipiZIDE (GLUCOTROL XL) 10 MG 24 hr tablet; Take 2 tablets (20 mg total) daily by mouth.  Dispense: 60 tablet; Refill: 3 - metFORMIN (GLUCOPHAGE) 1000 MG tablet; Take 1 tablet (1,000 mg total) 2 (two) times daily with a meal by mouth.  Dispense: 60 tablet; Refill: 3  3. Pure hypercholesterolemia Controlled - atorvastatin (LIPITOR) 20 MG tablet; Take 1 tablet (20 mg total) daily by mouth.  Dispense: 30 tablet; Refill: 3  4. Hypothyroidism, unspecified type Uncontrolled we will check thyroid levels again today - TSH - T4, free   Meds ordered this encounter  Medications  . amLODipine (NORVASC) 10 MG tablet    Sig: Take 1 tablet (10 mg total) daily by mouth.    Dispense:  30 tablet    Refill:  3  . atorvastatin (LIPITOR) 20 MG  tablet    Sig: Take 1 tablet (20 mg total) daily by mouth.    Dispense:  30 tablet    Refill:  3  . glipiZIDE (GLUCOTROL XL) 10 MG 24 hr tablet    Sig: Take 2 tablets (20 mg total) daily by mouth.    Dispense:  60 tablet    Refill:  3    Discontinue previous dose  . lisinopril-hydrochlorothiazide (PRINZIDE,ZESTORETIC) 20-25 MG tablet    Sig: Take 1 tablet daily by mouth.    Dispense:  30 tablet    Refill:  3  . metFORMIN (GLUCOPHAGE) 1000 MG tablet    Sig: Take 1 tablet (1,000 mg total) 2 (two) times daily with a meal by mouth.    Dispense:  60 tablet    Refill:  3    Follow-up: Return in about 3 months (around 02/27/2017) for Follow up of Diabetes mellitus.   Arnoldo Morale MD

## 2016-11-27 NOTE — Patient Instructions (Signed)
La diabetes mellitus y los alimentos (Diabetes Mellitus and Food) Es importante que controle su nivel de azcar en la sangre (glucosa). El nivel de glucosa en sangre depende en gran medida de lo que usted come. Comer alimentos saludables en las cantidades adecuadas a lo largo del da, aproximadamente a la misma hora todos los das, lo ayudar a controlar su nivel de glucosa en sangre. Tambin puede ayudarlo a retrasar o evitar el empeoramiento de la diabetes mellitus. Comer de manera saludable incluso puede ayudarlo a mejorar el nivel de presin arterial y a alcanzar o mantener un peso saludable. Entre las recomendaciones generales para alimentarse y cocinar los alimentos de forma saludable, se incluyen las siguientes:  Respetar las comidas principales y comer colaciones con regularidad. Evitar pasar largos perodos sin comer con el fin de perder peso.  Seguir una dieta que consista principalmente en alimentos de origen vegetal, como frutas, vegetales, frutos secos, legumbres y cereales integrales.  Utilizar mtodos de coccin a baja temperatura, como hornear, en lugar de mtodos de coccin a alta temperatura, como frer en abundante aceite. Trabaje con el nutricionista para aprender a usar la informacin nutricional de las etiquetas de los alimentos. CMO PUEDEN AFECTARME LOS ALIMENTOS? Carbohidratos Los carbohidratos afectan el nivel de glucosa en sangre ms que cualquier otro tipo de alimento. El nutricionista lo ayudar a determinar cuntos carbohidratos puede consumir en cada comida y ensearle a contarlos. El recuento de carbohidratos es importante para mantener la glucosa en sangre en un nivel saludable, en especial si utiliza insulina o toma determinados medicamentos para la diabetes mellitus. Alcohol El alcohol puede provocar disminuciones sbitas de la glucosa en sangre (hipoglucemia), en especial si utiliza insulina o toma determinados medicamentos para la diabetes mellitus. La  hipoglucemia es una afeccin que puede poner en peligro la vida. Los sntomas de la hipoglucemia (somnolencia, mareos y desorientacin) son similares a los sntomas de haber consumido mucho alcohol. Si el mdico lo autoriza a beber alcohol, hgalo con moderacin y siga estas pautas:  Las mujeres no deben beber ms de un trago por da, y los hombres no deben beber ms de dos tragos por da. Un trago es igual a:  12 onzas (355 ml) de cerveza  5 onzas de vino (150 ml) de vino  1,5onzas (45ml) de bebidas espirituosas  No beba con el estmago vaco.  Mantngase hidratado. Beba agua, gaseosas dietticas o t helado sin azcar.  Las gaseosas comunes, los jugos y otros refrescos podran contener muchos carbohidratos y se deben contar. QU ALIMENTOS NO SE RECOMIENDAN? Cuando haga las elecciones de alimentos, es importante que recuerde que todos los alimentos son distintos. Algunos tienen menos nutrientes que otros por porcin, aunque podran tener la misma cantidad de caloras o carbohidratos. Es difcil darle al cuerpo lo que necesita cuando consume alimentos con menos nutrientes. Estos son algunos ejemplos de alimentos que debera evitar ya que contienen muchas caloras y carbohidratos, pero pocos nutrientes:  Grasas trans (la mayora de los alimentos procesados incluyen grasas trans en la etiqueta de Informacin nutricional).  Gaseosas comunes.  Jugos.  Caramelos.  Dulces, como tortas, pasteles, rosquillas y galletas.  Comidas fritas. QU ALIMENTOS PUEDO COMER? Consuma alimentos ricos en nutrientes, que nutrirn el cuerpo y lo mantendrn saludable. Los alimentos que debe comer tambin dependern de varios factores, como:  Las caloras que necesita.  Los medicamentos que toma.  Su peso.  El nivel de glucosa en sangre.  El nivel de presin arterial.  El nivel de colesterol. Debe consumir   una amplia variedad de alimentos, por ejemplo:  Protenas.  Cortes de carne  magros.  Protenas con bajo contenido de grasas saturadas, como pescado, clara de huevo y frijoles. Evite las carnes procesadas.  Frutas y vegetales.  Frutas y vegetales que pueden ayudar a controlar los niveles sanguneos de glucosa, como manzanas, mangos y batatas.  Productos lcteos.  Elija productos lcteos sin grasa o con bajo contenido de grasa, como leche, yogur y queso.  Cereales, panes, pastas y arroz.  Elija cereales integrales, como panes multicereales, avena en grano y arroz integral. Estos alimentos pueden ayudar a controlar la presin arterial.  Grasas.  Alimentos que contengan grasas saludables, como frutos secos, aguacate, aceite de oliva, aceite de canola y pescado. TODOS LOS QUE PADECEN DIABETES MELLITUS TIENEN EL MISMO PLAN DE COMIDAS? Dado que todas las personas que padecen diabetes mellitus son distintas, no hay un solo plan de comidas que funcione para todos. Es muy importante que se rena con un nutricionista que lo ayudar a crear un plan de comidas adecuado para usted. Esta informacin no tiene como fin reemplazar el consejo del mdico. Asegrese de hacerle al mdico cualquier pregunta que tenga. Document Released: 04/09/2007 Document Revised: 01/21/2014 Document Reviewed: 11/27/2012 Elsevier Interactive Patient Education  2017 [employer] Inc.  

## 2016-11-27 NOTE — Progress Notes (Signed)
F/u DM management

## 2016-11-28 ENCOUNTER — Telehealth: Payer: Self-pay

## 2016-11-28 LAB — T4, FREE: Free T4: 1.19 ng/dL (ref 0.82–1.77)

## 2016-11-28 LAB — TSH: TSH: 4.86 u[IU]/mL — ABNORMAL HIGH (ref 0.450–4.500)

## 2016-11-28 MED ORDER — LEVOTHYROXINE SODIUM 137 MCG PO TABS: 137.0000 ug | ORAL_TABLET | Freq: Every day | ORAL | 3 refills | Status: DC

## 2016-11-28 NOTE — Telephone Encounter (Signed)
Pt was called and informed of lab results via interpreter.

## 2017-01-17 MED FILL — ?PIOGLITAZONE HCL 45 MG TAB: 45 | 30 days supply | Qty: 30 | Fill #2

## 2017-01-17 MED FILL — glipiZIDE XL 10 MG TB24: 10 | 30 days supply | Qty: 60 | Fill #2

## 2017-01-17 MED FILL — ?METFORMIN HCL 1,000 MG TAB: 1000 | 30 days supply | Qty: 60 | Fill #0

## 2017-01-17 MED FILL — ?LEVOTHYROXINE 137 MCG TAB: 137 | 30 days supply | Qty: 30 | Fill #1

## 2017-01-17 MED FILL — ?ATORVASTATIN 20 MG TABLET: 20 | 30 days supply | Qty: 30 | Fill #2

## 2017-01-17 MED FILL — AMLODIPINE BESYLATE 10 MG T: 10 | 30 days supply | Qty: 30 | Fill #2

## 2017-02-26 ENCOUNTER — Ambulatory Visit: Payer: Self-pay

## 2017-02-28 ENCOUNTER — Encounter: Payer: Self-pay | Admitting: Family Medicine

## 2017-02-28 ENCOUNTER — Ambulatory Visit: Payer: Self-pay | Attending: Family Medicine | Admitting: Family Medicine

## 2017-02-28 VITALS — BP 134/74 | HR 78 | Temp 98.0°F | Wt 167.8 lb

## 2017-02-28 DIAGNOSIS — E1165 Type 2 diabetes mellitus with hyperglycemia: Secondary | ICD-10-CM | POA: Insufficient documentation

## 2017-02-28 DIAGNOSIS — Z7982 Long term (current) use of aspirin: Secondary | ICD-10-CM | POA: Insufficient documentation

## 2017-02-28 DIAGNOSIS — IMO0001 Reserved for inherently not codable concepts without codable children: Secondary | ICD-10-CM

## 2017-02-28 DIAGNOSIS — E039 Hypothyroidism, unspecified: Secondary | ICD-10-CM | POA: Insufficient documentation

## 2017-02-28 DIAGNOSIS — Z79899 Other long term (current) drug therapy: Secondary | ICD-10-CM | POA: Insufficient documentation

## 2017-02-28 DIAGNOSIS — Z7984 Long term (current) use of oral hypoglycemic drugs: Secondary | ICD-10-CM | POA: Insufficient documentation

## 2017-02-28 DIAGNOSIS — I10 Essential (primary) hypertension: Secondary | ICD-10-CM | POA: Insufficient documentation

## 2017-02-28 DIAGNOSIS — Z9119 Patient's noncompliance with other medical treatment and regimen: Secondary | ICD-10-CM | POA: Insufficient documentation

## 2017-02-28 DIAGNOSIS — E7800 Pure hypercholesterolemia, unspecified: Secondary | ICD-10-CM | POA: Insufficient documentation

## 2017-02-28 DIAGNOSIS — E119 Type 2 diabetes mellitus without complications: Secondary | ICD-10-CM

## 2017-02-28 DIAGNOSIS — Z79890 Hormone replacement therapy (postmenopausal): Secondary | ICD-10-CM | POA: Insufficient documentation

## 2017-02-28 LAB — GLUCOSE, POCT (MANUAL RESULT ENTRY): POC Glucose: 89 mg/dl (ref 70–99)

## 2017-02-28 MED ORDER — ATORVASTATIN CALCIUM 20 MG PO TABS: 20.0000 mg | ORAL_TABLET | Freq: Every day | ORAL | 3 refills | Status: DC

## 2017-02-28 MED ORDER — PIOGLITAZONE HCL 45 MG PO TABS: 45.0000 mg | ORAL_TABLET | Freq: Every day | ORAL | 3 refills | Status: DC

## 2017-02-28 MED ORDER — AMLODIPINE BESYLATE 10 MG PO TABS: 10.0000 mg | ORAL_TABLET | Freq: Every day | ORAL | 3 refills | Status: DC

## 2017-02-28 MED ORDER — GLIPIZIDE ER 10 MG PO TB24: 20.0000 mg | ORAL_TABLET | Freq: Every day | ORAL | 3 refills | Status: DC

## 2017-02-28 MED ORDER — METFORMIN HCL 1000 MG PO TABS: 1000.0000 mg | ORAL_TABLET | Freq: Two times a day (BID) | ORAL | 3 refills | Status: DC

## 2017-02-28 MED ORDER — LISINOPRIL-HYDROCHLOROTHIAZIDE 20-25 MG PO TABS: 1.0000 | ORAL_TABLET | Freq: Every day | ORAL | 3 refills | Status: DC

## 2017-02-28 MED FILL — glipiZIDE ER 10 MG TB24: 10 | 30 days supply | Qty: 60 | Fill #0

## 2017-02-28 MED FILL — ?ATORVASTATIN 20MG TABL: 20 | 30 days supply | Qty: 30 | Fill #0

## 2017-02-28 MED FILL — AMLODIPINE BESYLATE 10 MG T: 10 | 30 days supply | Qty: 30 | Fill #0

## 2017-02-28 MED FILL — ?METFORMIN HCL 1,000 MG TAB: 1000 | 30 days supply | Qty: 60 | Fill #0

## 2017-02-28 MED FILL — LISINOPRIL-HCTZ 20-25 MG TA: 20-25 | 30 days supply | Qty: 30 | Fill #0

## 2017-02-28 MED FILL — PIOGLITAZONE HCL 45 MG TAB: 45 | 30 days supply | Qty: 30 | Fill #0

## 2017-02-28 NOTE — Patient Instructions (Signed)
Diabetes Mellitus and Nutrition When you have diabetes (diabetes mellitus), it is very important to have healthy eating habits because your blood sugar (glucose) levels are greatly affected by what you eat and drink. Eating healthy foods in the appropriate amounts, at about the same times every day, can help you:  Control your blood glucose.  Lower your risk of heart disease.  Improve your blood pressure.  Reach or maintain a healthy weight.  Every person with diabetes is different, and each person has different needs for a meal plan. Your health care provider may recommend that you work with a diet and nutrition specialist (dietitian) to make a meal plan that is best for you. Your meal plan may vary depending on factors such as:  The calories you need.  The medicines you take.  Your weight.  Your blood glucose, blood pressure, and cholesterol levels.  Your activity level.  Other health conditions you have, such as heart or kidney disease.  How do carbohydrates affect me? Carbohydrates affect your blood glucose level more than any other type of food. Eating carbohydrates naturally increases the amount of glucose in your blood. Carbohydrate counting is a method for keeping track of how many carbohydrates you eat. Counting carbohydrates is important to keep your blood glucose at a healthy level, especially if you use insulin or take certain oral diabetes medicines. It is important to know how many carbohydrates you can safely have in each meal. This is different for every person. Your dietitian can help you calculate how many carbohydrates you should have at each meal and for snack. Foods that contain carbohydrates include:  Bread, cereal, rice, pasta, and crackers.  Potatoes and corn.  Peas, beans, and lentils.  Milk and yogurt.  Fruit and juice.  Desserts, such as cakes, cookies, ice cream, and candy.  How does alcohol affect me? Alcohol can cause a sudden decrease in blood  glucose (hypoglycemia), especially if you use insulin or take certain oral diabetes medicines. Hypoglycemia can be a life-threatening condition. Symptoms of hypoglycemia (sleepiness, dizziness, and confusion) are similar to symptoms of having too much alcohol. If your health care provider says that alcohol is safe for you, follow these guidelines:  Limit alcohol intake to no more than 1 drink per day for nonpregnant women and 2 drinks per day for men. One drink equals 12 oz of beer, 5 oz of wine, or 1 oz of hard liquor.  Do not drink on an empty stomach.  Keep yourself hydrated with water, diet soda, or unsweetened iced tea.  Keep in mind that regular soda, juice, and other mixers may contain a lot of sugar and must be counted as carbohydrates.  What are tips for following this plan? Reading food labels  Start by checking the serving size on the label. The amount of calories, carbohydrates, fats, and other nutrients listed on the label are based on one serving of the food. Many foods contain more than one serving per package.  Check the total grams (g) of carbohydrates in one serving. You can calculate the number of servings of carbohydrates in one serving by dividing the total carbohydrates by 15. For example, if a food has 30 g of total carbohydrates, it would be equal to 2 servings of carbohydrates.  Check the number of grams (g) of saturated and trans fats in one serving. Choose foods that have low or no amount of these fats.  Check the number of milligrams (mg) of sodium in one serving. Most people   should limit total sodium intake to less than 2,300 mg per day.  Always check the nutrition information of foods labeled as "low-fat" or "nonfat". These foods may be higher in added sugar or refined carbohydrates and should be avoided.  Talk to your dietitian to identify your daily goals for nutrients listed on the label. Shopping  Avoid buying canned, premade, or processed foods. These  foods tend to be high in fat, sodium, and added sugar.  Shop around the outside edge of the grocery store. This includes fresh fruits and vegetables, bulk grains, fresh meats, and fresh dairy. Cooking  Use low-heat cooking methods, such as baking, instead of high-heat cooking methods like deep frying.  Cook using healthy oils, such as olive, canola, or sunflower oil.  Avoid cooking with butter, cream, or high-fat meats. Meal planning  Eat meals and snacks regularly, preferably at the same times every day. Avoid going long periods of time without eating.  Eat foods high in fiber, such as fresh fruits, vegetables, beans, and whole grains. Talk to your dietitian about how many servings of carbohydrates you can eat at each meal.  Eat 4-6 ounces of lean protein each day, such as lean meat, chicken, fish, eggs, or tofu. 1 ounce is equal to 1 ounce of meat, chicken, or fish, 1 egg, or 1/4 cup of tofu.  Eat some foods each day that contain healthy fats, such as avocado, nuts, seeds, and fish. Lifestyle   Check your blood glucose regularly.  Exercise at least 30 minutes 5 or more days each week, or as told by your health care provider.  Take medicines as told by your health care provider.  Do not use any products that contain nicotine or tobacco, such as cigarettes and e-cigarettes. If you need help quitting, ask your health care provider.  Work with a counselor or diabetes educator to identify strategies to manage stress and any emotional and social challenges. What are some questions to ask my health care provider?  Do I need to meet with a diabetes educator?  Do I need to meet with a dietitian?  What number can I call if I have questions?  When are the best times to check my blood glucose? Where to find more information:  American Diabetes Association: diabetes.org/food-and-fitness/food  Academy of Nutrition and Dietetics:  www.eatright.org/resources/health/diseases-and-conditions/diabetes  National Institute of Diabetes and Digestive and Kidney Diseases (NIH): www.niddk.nih.gov/health-information/diabetes/overview/diet-eating-physical-activity Summary  A healthy meal plan will help you control your blood glucose and maintain a healthy lifestyle.  Working with a diet and nutrition specialist (dietitian) can help you make a meal plan that is best for you.  Keep in mind that carbohydrates and alcohol have immediate effects on your blood glucose levels. It is important to count carbohydrates and to use alcohol carefully. This information is not intended to replace advice given to you by your health care provider. Make sure you discuss any questions you have with your health care provider. Document Released: 09/27/2004 Document Revised: 02/05/2016 Document Reviewed: 02/05/2016 Elsevier Interactive Patient Education  2018 Elsevier Inc.  

## 2017-03-01 LAB — MICROALBUMIN / CREATININE URINE RATIO
CREATININE, UR: 108.5 mg/dL
MICROALB/CREAT RATIO: 6.5 mg/g{creat} (ref 0.0–30.0)
MICROALBUM., U, RANDOM: 7 ug/mL

## 2017-03-01 LAB — CMP14+EGFR
A/G RATIO: 1.3 (ref 1.2–2.2)
ALK PHOS: 136 IU/L — AB (ref 39–117)
ALT: 14 IU/L (ref 0–32)
AST: 14 IU/L (ref 0–40)
Albumin: 4.1 g/dL (ref 3.5–4.8)
BILIRUBIN TOTAL: 0.2 mg/dL (ref 0.0–1.2)
BUN/Creatinine Ratio: 17 (ref 12–28)
BUN: 10 mg/dL (ref 8–27)
CHLORIDE: 102 mmol/L (ref 96–106)
CO2: 23 mmol/L (ref 20–29)
Calcium: 9.3 mg/dL (ref 8.7–10.3)
Creatinine, Ser: 0.6 mg/dL (ref 0.57–1.00)
GFR calc Af Amer: 107 mL/min/{1.73_m2} (ref >59)
GFR calc non Af Amer: 93 mL/min/{1.73_m2} (ref >59)
GLOBULIN, TOTAL: 3.2 g/dL (ref 1.5–4.5)
GLUCOSE: 93 mg/dL (ref 65–99)
POTASSIUM: 4.4 mmol/L (ref 3.5–5.2)
SODIUM: 140 mmol/L (ref 134–144)
Total Protein: 7.3 g/dL (ref 6.0–8.5)

## 2017-03-01 LAB — LIPID PANEL
CHOLESTEROL TOTAL: 239 mg/dL — AB (ref 100–199)
Chol/HDL Ratio: 4.1 ratio (ref 0.0–4.4)
HDL: 59 mg/dL (ref >39)
LDL Calculated: 155 mg/dL — ABNORMAL HIGH (ref 0–99)
Triglycerides: 125 mg/dL (ref 0–149)
VLDL Cholesterol Cal: 25 mg/dL (ref 5–40)

## 2017-03-01 LAB — TSH: TSH: 6.28 u[IU]/mL — ABNORMAL HIGH (ref 0.450–4.500)

## 2017-03-01 LAB — HEMOGLOBIN A1C
ESTIMATED AVERAGE GLUCOSE: 197 mg/dL
HEMOGLOBIN A1C: 8.5 % — AB (ref 4.8–5.6)

## 2017-03-02 MED ORDER — ATORVASTATIN CALCIUM 40 MG PO TABS: 40.0000 mg | ORAL_TABLET | Freq: Every day | ORAL | 3 refills | Status: DC

## 2017-03-02 MED ORDER — LEVOTHYROXINE SODIUM 137 MCG PO TABS: 137.0000 ug | ORAL_TABLET | Freq: Every day | ORAL | 3 refills | Status: DC

## 2017-03-02 NOTE — Progress Notes (Signed)
Subjective:  Patient ID: Linda House, female    DOB: [birth year]  Age: 72 y.o. MRN: [id number]  CC: Diabetes   HPI [name]  is a 72 year old female with a history of uncontrolled type 2 diabetes mellitus (A1c 9.9), hypothyroidism, hyperlipidemia, hypertension who comes in for a follow-up visit  She has been compliant with her diabetic medications and denies hypoglycemia, visual concerns or numbness in extremities She is not up to date on annual eye exams.  Doing well on her antihypertensive and statin and denies myalgias or other adverse effects. Compliant with her Levothyroxine  She does not exercise regularly and has not been compliant with a diabetic diet. She has no additional concerns today.  Past Medical History:  Diagnosis Date  . Diabetes mellitus   . Diabetes mellitus without complication ([place])   . Hypertension     History reviewed. No pertinent surgical history.  No Known Allergies   Outpatient Medications Prior to Visit  Medication Sig Dispense Refill  . aspirin EC 81 MG tablet Take 1 tablet (81 mg total) by mouth daily. 30 tablet 3  . glucose blood test strip Use as instructed 100 each 12  . glucose monitoring kit (FREESTYLE) monitoring kit 1 each by Does not apply route as needed. 1 each 0  . Lancets (FREESTYLE) lancets Use as instructed 100 each 12  . levothyroxine (SYNTHROID, LEVOTHROID) 137 MCG tablet Take 1 tablet (137 mcg total) daily by mouth. 30 tablet 3  . meloxicam (MOBIC) 7.5 MG tablet Take 1 tablet (7.5 mg total) by mouth daily. 30 tablet 3  . amLODipine (NORVASC) 10 MG tablet Take 1 tablet (10 mg total) daily by mouth. 30 tablet 3  . atorvastatin (LIPITOR) 20 MG tablet Take 1 tablet (20 mg total) daily by mouth. 30 tablet 3  . glipiZIDE (GLUCOTROL XL) 10 MG 24 hr tablet Take 2 tablets (20 mg total) daily by mouth. 60 tablet 3  . lisinopril-hydrochlorothiazide (PRINZIDE,ZESTORETIC) 20-25 MG tablet Take 1 tablet daily  by mouth. 30 tablet 3  . metFORMIN (GLUCOPHAGE) 1000 MG tablet Take 1 tablet (1,000 mg total) 2 (two) times daily with a meal by mouth. 60 tablet 3  . pioglitazone (ACTOS) 45 MG tablet Take 1 tablet (45 mg total) by mouth daily. 30 tablet 3   No facility-administered medications prior to visit.     ROS Review of Systems  Constitutional: Negative for activity change, appetite change and fatigue.  HENT: Negative for congestion, sinus pressure and sore throat.   Eyes: Negative for visual disturbance.  Respiratory: Negative for cough, chest tightness, shortness of breath and wheezing.   Cardiovascular: Negative for chest pain and palpitations.  Gastrointestinal: Negative for abdominal distention, abdominal pain and constipation.  Endocrine: Negative for polydipsia.  Genitourinary: Negative for dysuria and frequency.  Musculoskeletal: Negative for arthralgias and back pain.  Skin: Negative for rash.  Neurological: Negative for tremors, light-headedness and numbness.  Hematological: Does not bruise/bleed easily.  Psychiatric/Behavioral: Negative for agitation and behavioral problems.    Objective:  BP 134/74   Pulse 78   Temp 98 F (36.7 C) (Oral)   Wt 167 lb 12.8 oz (76.1 kg)   SpO2 97%   BMI 33.32 kg/m   BP/Weight 02/28/2017 11/27/2016 7/61/9509  Systolic BP 326 712 458  Diastolic BP 74 75 71  Wt. (Lbs) 167.8 166 166.8  BMI 33.32 32.97 33.13      Physical Exam  Constitutional: She is oriented to person, place, and time. She appears  well-developed and well-nourished.  Cardiovascular: Normal rate, normal heart sounds and intact distal pulses.  No murmur heard. Pulmonary/Chest: Effort normal and breath sounds normal. She has no wheezes. She has no rales. She exhibits no tenderness.  Abdominal: Soft. Bowel sounds are normal. She exhibits no distension and no mass. There is no tenderness.  Musculoskeletal: Normal range of motion.  Neurological: She is alert and oriented to  person, place, and time.  Skin: Skin is warm and dry.  Psychiatric: She has a normal mood and affect.     Assessment & Plan:   1. Diabetes mellitus without complication ([place]) Uncontrolled with A1c of 9.9 Will send off A1c and adjust regimen accordingly Discusses community resources for annual eye exam as she ha sno medical coverage for referral Counseled on Diabetic diet, my plate method, 062 minutes of moderate intensity exercise/week Keep blood sugar logs with fasting goals of 80-120 mg/dl, random of less than 180 and in the event of sugars less than 60 mg/dl or greater than 400 mg/dl please notify the clinic ASAP. It is recommended that you undergo annual eye exams and annual foot exams. Pneumonia vaccine is recommended. - POCT glucose (manual entry) - Hemoglobin A1c - CMP14+EGFR - Lipid panel - TSH - Microalbumin/Creatinine Ratio, Urine  2. Essential hypertension, benign Controlled Counseled on blood pressure goal of less than 130/80, low-sodium, DASH diet, medication compliance, 150 minutes of moderate intensity exercise per week. Discussed medication compliance, adverse effects. - amLODipine (NORVASC) 10 MG tablet; Take 1 tablet (10 mg total) by mouth daily.  Dispense: 30 tablet; Refill: 3 - lisinopril-hydrochlorothiazide (PRINZIDE,ZESTORETIC) 20-25 MG tablet; Take 1 tablet by mouth daily.  Dispense: 30 tablet; Refill: 3  3. Uncontrolled type 2 diabetes mellitus without complication, without long-term current use of insulin ([facility]) - amLODipine (NORVASC) 10 MG tablet; Take 1 tablet (10 mg total) by mouth daily.  Dispense: 30 tablet; Refill: 3 - glipiZIDE (GLUCOTROL XL) 10 MG 24 hr tablet; Take 2 tablets (20 mg total) by mouth daily.  Dispense: 60 tablet; Refill: 3 - metFORMIN (GLUCOPHAGE) 1000 MG tablet; Take 1 tablet (1,000 mg total) by mouth 2 (two) times daily with a meal.  Dispense: 60 tablet; Refill: 3 - pioglitazone (ACTOS) 45 MG tablet; Take 1 tablet (45 mg total) by  mouth daily.  Dispense: 30 tablet; Refill: 3  4. Pure hypercholesterolemia Controlled Low cholesterol diet - atorvastatin (LIPITOR) 20 MG tablet; Take 1 tablet (20 mg total) by mouth daily.  Dispense: 30 tablet; Refill: 3  5. Hypothyroidism TSH sent off and Levothyroxine dose will be adjusted accordingly  Meds ordered this encounter  Medications  . amLODipine (NORVASC) 10 MG tablet    Sig: Take 1 tablet (10 mg total) by mouth daily.    Dispense:  30 tablet    Refill:  3  . atorvastatin (LIPITOR) 20 MG tablet    Sig: Take 1 tablet (20 mg total) by mouth daily.    Dispense:  30 tablet    Refill:  3  . glipiZIDE (GLUCOTROL XL) 10 MG 24 hr tablet    Sig: Take 2 tablets (20 mg total) by mouth daily.    Dispense:  60 tablet    Refill:  3    Discontinue previous dose  . lisinopril-hydrochlorothiazide (PRINZIDE,ZESTORETIC) 20-25 MG tablet    Sig: Take 1 tablet by mouth daily.    Dispense:  30 tablet    Refill:  3  . metFORMIN (GLUCOPHAGE) 1000 MG tablet    Sig: Take 1 tablet (  1,000 mg total) by mouth 2 (two) times daily with a meal.    Dispense:  60 tablet    Refill:  3  . pioglitazone (ACTOS) 45 MG tablet    Sig: Take 1 tablet (45 mg total) by mouth daily.    Dispense:  30 tablet    Refill:  3    Follow-up: Return in about 3 months (around 05/28/2017) for Follow up of chronic medical conditions.   Charlott Rakes MD

## 2017-03-03 MED FILL — ?ATORVASTATIN 40MG TABLET: 40 | 30 days supply | Qty: 30 | Fill #0

## 2017-03-03 MED FILL — ?LEVOTHYROXINE 137 MCG TAB: 137 | 30 days supply | Qty: 30 | Fill #0

## 2017-04-30 MED FILL — LISINOPRIL-HCTZ 20-25 MG TA: 20-25 | 30 days supply | Qty: 30 | Fill #1

## 2017-04-30 MED FILL — ?ATORVASTATIN 20MG TABL: 20 | 30 days supply | Qty: 30 | Fill #1

## 2017-04-30 MED FILL — AMLODIPINE BESYLATE 10 MG T: 10 | 30 days supply | Qty: 30 | Fill #1

## 2017-04-30 MED FILL — ?LEVOTHYROXINE 137 MCG TAB: 137 | 30 days supply | Qty: 30 | Fill #1

## 2017-04-30 MED FILL — PIOGLITAZONE HCL 45 MG TABS: 45 | 30 days supply | Qty: 30 | Fill #1

## 2017-04-30 MED FILL — glipiZIDE XL 10 MG TB24: 10 | 30 days supply | Qty: 60 | Fill #1

## 2017-04-30 MED FILL — ?METFORMIN HCL 1,000 MG TAB: 1000 | 30 days supply | Qty: 60 | Fill #1

## 2017-04-30 MED FILL — MELOXICAM 7.5 MG TABLET: 7.5 | 30 days supply | Qty: 30 | Fill #2

## 2017-05-27 ENCOUNTER — Ambulatory Visit: Payer: Self-pay | Attending: Family Medicine | Admitting: Family Medicine

## 2017-05-27 ENCOUNTER — Encounter: Payer: Self-pay | Admitting: Family Medicine

## 2017-05-27 VITALS — BP 113/74 | HR 85 | Temp 98.1°F | Wt 163.4 lb

## 2017-05-27 DIAGNOSIS — G4709 Other insomnia: Secondary | ICD-10-CM

## 2017-05-27 DIAGNOSIS — Z79890 Hormone replacement therapy (postmenopausal): Secondary | ICD-10-CM | POA: Insufficient documentation

## 2017-05-27 DIAGNOSIS — E7800 Pure hypercholesterolemia, unspecified: Secondary | ICD-10-CM | POA: Insufficient documentation

## 2017-05-27 DIAGNOSIS — Z79899 Other long term (current) drug therapy: Secondary | ICD-10-CM | POA: Insufficient documentation

## 2017-05-27 DIAGNOSIS — Z7984 Long term (current) use of oral hypoglycemic drugs: Secondary | ICD-10-CM | POA: Insufficient documentation

## 2017-05-27 DIAGNOSIS — IMO0001 Reserved for inherently not codable concepts without codable children: Secondary | ICD-10-CM

## 2017-05-27 DIAGNOSIS — I10 Essential (primary) hypertension: Secondary | ICD-10-CM | POA: Insufficient documentation

## 2017-05-27 DIAGNOSIS — Z7982 Long term (current) use of aspirin: Secondary | ICD-10-CM | POA: Insufficient documentation

## 2017-05-27 DIAGNOSIS — Z9114 Patient's other noncompliance with medication regimen: Secondary | ICD-10-CM | POA: Insufficient documentation

## 2017-05-27 DIAGNOSIS — G4700 Insomnia, unspecified: Secondary | ICD-10-CM | POA: Insufficient documentation

## 2017-05-27 DIAGNOSIS — E785 Hyperlipidemia, unspecified: Secondary | ICD-10-CM | POA: Insufficient documentation

## 2017-05-27 DIAGNOSIS — E1165 Type 2 diabetes mellitus with hyperglycemia: Secondary | ICD-10-CM | POA: Insufficient documentation

## 2017-05-27 DIAGNOSIS — E039 Hypothyroidism, unspecified: Secondary | ICD-10-CM | POA: Insufficient documentation

## 2017-05-27 DIAGNOSIS — Z9119 Patient's noncompliance with other medical treatment and regimen: Secondary | ICD-10-CM | POA: Insufficient documentation

## 2017-05-27 DIAGNOSIS — E0800 Diabetes mellitus due to underlying condition with hyperosmolarity without nonketotic hyperglycemic-hyperosmolar coma (NKHHC): Secondary | ICD-10-CM

## 2017-05-27 LAB — GLUCOSE, POCT (MANUAL RESULT ENTRY): POC Glucose: 184 mg/dl — AB (ref 70–99)

## 2017-05-27 LAB — POCT GLYCOSYLATED HEMOGLOBIN (HGB A1C): Hemoglobin A1C: 10.2

## 2017-05-27 MED ORDER — METFORMIN HCL 1000 MG PO TABS: 1000.0000 mg | ORAL_TABLET | Freq: Two times a day (BID) | ORAL | 3 refills | Status: DC

## 2017-05-27 MED ORDER — PIOGLITAZONE HCL 45 MG PO TABS: 45.0000 mg | ORAL_TABLET | Freq: Every day | ORAL | 3 refills | Status: DC

## 2017-05-27 MED ORDER — LEVOTHYROXINE SODIUM 137 MCG PO TABS: 137.0000 ug | ORAL_TABLET | Freq: Every day | ORAL | 3 refills | Status: DC

## 2017-05-27 MED ORDER — AMLODIPINE BESYLATE 10 MG PO TABS: 10.0000 mg | ORAL_TABLET | Freq: Every day | ORAL | 3 refills | Status: DC

## 2017-05-27 MED ORDER — GLIPIZIDE ER 10 MG PO TB24: 20.0000 mg | ORAL_TABLET | Freq: Every day | ORAL | 3 refills | Status: DC

## 2017-05-27 MED ORDER — ATORVASTATIN CALCIUM 40 MG PO TABS: 40.0000 mg | ORAL_TABLET | Freq: Every day | ORAL | 3 refills | Status: DC

## 2017-05-27 MED ORDER — LISINOPRIL-HYDROCHLOROTHIAZIDE 20-25 MG PO TABS: 1.0000 | ORAL_TABLET | Freq: Every day | ORAL | 3 refills | Status: DC

## 2017-05-27 MED FILL — LISINOPRIL-HCTZ 20-25 MG TA: 20-25 | 30 days supply | Qty: 30 | Fill #0

## 2017-05-27 MED FILL — ?METFORMIN HCL 1,000 MG TAB: 1000 | 30 days supply | Qty: 60 | Fill #0

## 2017-05-27 MED FILL — glipiZIDE XL 10 MG TB24: 10 | 30 days supply | Qty: 60 | Fill #0

## 2017-05-27 MED FILL — PIOGLITAZONE HCL 45 MG TABS: 45 | 30 days supply | Qty: 30 | Fill #0

## 2017-05-27 MED FILL — ?ATORVASTATIN 40MG TABLET: 40 | 30 days supply | Qty: 30 | Fill #0

## 2017-05-27 MED FILL — AMLODIPINE BESYLATE 10 MG T: 10 | 30 days supply | Qty: 30 | Fill #0

## 2017-05-27 MED FILL — ?LEVOTHYROXINE 137 MCG TAB: 137 | 30 days supply | Qty: 30 | Fill #0

## 2017-05-27 NOTE — Progress Notes (Signed)
Subjective:  Patient ID: Linda House, female    DOB: 05/11/1945  Age: 72 y.o. MRN: [id number]  CC: Diabetes   HPI Linda House  is a 72 year old female with a history of uncontrolled type 2 diabetes mellitus (A1c 10.2), hypothyroidism, hyperlipidemia, hypertension who comes in for a follow-up visit. Her lipid panel had been elevated at her last visit and Lipitor increased to 40 mg however she never received increased dose of Lipitor. Her A1c is 10.2 which has increased from 8.5 previously and she informs me she had been noncompliant with her medications for over 2 weeks as she had not been feeling well and was only able to tolerate Motrin.  She has not been compliant with a diabetic diet. Denies adverse effects from her medications and tolerates her levothyroxine which she takes for hypothyroidism (of note her TSH had been elevated at her last visit and levothyroxine dose had been adjusted). She  complains of fatigue and insomnia but is reluctant to taking medications because she does not want to become addicted.  Past Medical History:  Diagnosis Date  . Diabetes mellitus   . Diabetes mellitus without complication ([place])   . Hypertension     No past surgical history on file.  No Known Allergies   Outpatient Medications Prior to Visit  Medication Sig Dispense Refill  . aspirin EC 81 MG tablet Take 1 tablet (81 mg total) by mouth daily. 30 tablet 3  . glucose blood test strip Use as instructed 100 each 12  . glucose monitoring kit (FREESTYLE) monitoring kit 1 each by Does not apply route as needed. 1 each 0  . Lancets (FREESTYLE) lancets Use as instructed 100 each 12  . meloxicam (MOBIC) 7.5 MG tablet Take 1 tablet (7.5 mg total) by mouth daily. 30 tablet 3  . amLODipine (NORVASC) 10 MG tablet Take 1 tablet (10 mg total) by mouth daily. 30 tablet 3  . atorvastatin (LIPITOR) 40 MG tablet Take 1 tablet (40 mg total) by mouth daily. 30 tablet 3  . glipiZIDE  (GLUCOTROL XL) 10 MG 24 hr tablet Take 2 tablets (20 mg total) by mouth daily. 60 tablet 3  . levothyroxine (SYNTHROID, LEVOTHROID) 137 MCG tablet Take 1 tablet (137 mcg total) by mouth daily. 30 tablet 3  . lisinopril-hydrochlorothiazide (PRINZIDE,ZESTORETIC) 20-25 MG tablet Take 1 tablet by mouth daily. 30 tablet 3  . metFORMIN (GLUCOPHAGE) 1000 MG tablet Take 1 tablet (1,000 mg total) by mouth 2 (two) times daily with a meal. 60 tablet 3  . pioglitazone (ACTOS) 45 MG tablet Take 1 tablet (45 mg total) by mouth daily. 30 tablet 3   No facility-administered medications prior to visit.     ROS Review of Systems  Constitutional: Positive for fatigue. Negative for activity change and appetite change.  HENT: Negative for congestion, sinus pressure and sore throat.   Eyes: Negative for visual disturbance.  Respiratory: Negative for cough, chest tightness, shortness of breath and wheezing.   Cardiovascular: Negative for chest pain and palpitations.  Gastrointestinal: Negative for abdominal distention, abdominal pain and constipation.  Endocrine: Negative for polydipsia.  Genitourinary: Negative for dysuria and frequency.  Musculoskeletal: Negative for arthralgias and back pain.  Skin: Negative for rash.  Neurological: Negative for tremors, light-headedness and numbness.  Hematological: Does not bruise/bleed easily.  Psychiatric/Behavioral: Positive for sleep disturbance. Negative for agitation and behavioral problems.    Objective:  BP 113/74   Pulse 85   Temp 98.1 F (36.7 C) (Oral)  Wt 163 lb 6.4 oz (74.1 kg)   SpO2 96%   BMI 32.45 kg/m   BP/Weight 05/27/2017 02/28/2017 59/56/3875  Systolic BP 643 329 518  Diastolic BP 74 74 75  Wt. (Lbs) 163.4 167.8 166  BMI 32.45 33.32 32.97     Physical Exam  Constitutional: She is oriented to person, place, and time. She appears well-developed and well-nourished.  Cardiovascular: Normal rate, normal heart sounds and intact distal pulses.   No murmur heard. Pulmonary/Chest: Effort normal and breath sounds normal. She has no wheezes. She has no rales. She exhibits no tenderness.  Abdominal: Soft. Bowel sounds are normal. She exhibits no distension and no mass. There is no tenderness.  Musculoskeletal: Normal range of motion.  Neurological: She is alert and oriented to person, place, and time.  Skin: Skin is warm and dry.  Psychiatric: She has a normal mood and affect.    CMP Latest Ref Rng & Units 02/28/2017 08/27/2016 02/19/2016  Glucose 65 - 99 mg/dL 93 136(H) 123(H)  BUN 8 - 27 mg/dL '10 10 11  ' Creatinine 0.57 - 1.00 mg/dL 0.60 0.61 0.74  Sodium 134 - 144 mmol/L 140 140 139  Potassium 3.5 - 5.2 mmol/L 4.4 4.5 4.3  Chloride 96 - 106 mmol/L 102 103 101  CO2 20 - 29 mmol/L '23 24 28  ' Calcium 8.7 - 10.3 mg/dL 9.3 8.7 9.4  Total Protein 6.0 - 8.5 g/dL 7.3 7.4 7.6  Total Bilirubin 0.0 - 1.2 mg/dL 0.2 0.2 0.5  Alkaline Phos 39 - 117 IU/L 136(H) 161(H) 140(H)  AST 0 - 40 IU/L '14 13 16  ' ALT 0 - 32 IU/L '14 10 13    ' Lipid Panel     Component Value Date/Time   CHOL 239 (H) 02/28/2017 0917   TRIG 125 02/28/2017 0917   HDL 59 02/28/2017 0917   CHOLHDL 4.1 02/28/2017 0917   CHOLHDL 3.4 02/19/2016 1021   VLDL 25 08/29/2015 0849   LDLCALC 155 (H) 02/28/2017 0917    Lab Results  Component Value Date   HGBA1C 10.2 05/27/2017     Assessment & Plan:   1. Essential hypertension, benign Controlled Low-sodium diet - amLODipine (NORVASC) 10 MG tablet; Take 1 tablet (10 mg total) by mouth daily.  Dispense: 30 tablet; Refill: 3 - lisinopril-hydrochlorothiazide (PRINZIDE,ZESTORETIC) 20-25 MG tablet; Take 1 tablet by mouth daily.  Dispense: 30 tablet; Refill: 3  2. Uncontrolled type 2 diabetes mellitus without complication, without long-term current use of insulin (HCC) Controlled with A1c of 10.2 which has trended up from 8.5 She attributes this to noncompliance with her medications during the period when she was sick No regimen  changes, compliance emphasized Diabetic diet, lifestyle modifications - POCT glucose (manual entry) - POCT glycosylated hemoglobin (Hb A1C) - glipiZIDE (GLUCOTROL XL) 10 MG 24 hr tablet; Take 2 tablets (20 mg total) by mouth daily.  Dispense: 60 tablet; Refill: 3 - metFORMIN (GLUCOPHAGE) 1000 MG tablet; Take 1 tablet (1,000 mg total) by mouth 2 (two) times daily with a meal.  Dispense: 60 tablet; Refill: 3 - pioglitazone (ACTOS) 45 MG tablet; Take 1 tablet (45 mg total) by mouth daily.  Dispense: 30 tablet; Refill: 3  3. Pure hypercholesterolemia Uncontrolled We will make no regimen changes as she never commenced increased dose of Lipitor - atorvastatin (LIPITOR) 40 MG tablet; Take 1 tablet (40 mg total) by mouth daily.  Dispense: 30 tablet; Refill: 3  4. Hypothyroidism, unspecified type Uncontrolled No regimen change today as she is yet to  commence the increased dose of levothyroxine - levothyroxine (SYNTHROID, LEVOTHROID) 137 MCG tablet; Take 1 tablet (137 mcg total) by mouth daily.  Dispense: 30 tablet; Refill: 3  5. Non compliance w medication regimen Emphasized the need to be compliant and she promises to do better  6. Other insomnia Discussed sleep hygiene Declines medication   Meds ordered this encounter  Medications  . amLODipine (NORVASC) 10 MG tablet    Sig: Take 1 tablet (10 mg total) by mouth daily.    Dispense:  30 tablet    Refill:  3  . atorvastatin (LIPITOR) 40 MG tablet    Sig: Take 1 tablet (40 mg total) by mouth daily.    Dispense:  30 tablet    Refill:  3    Discontinue previous dose  . glipiZIDE (GLUCOTROL XL) 10 MG 24 hr tablet    Sig: Take 2 tablets (20 mg total) by mouth daily.    Dispense:  60 tablet    Refill:  3    Discontinue previous dose  . levothyroxine (SYNTHROID, LEVOTHROID) 137 MCG tablet    Sig: Take 1 tablet (137 mcg total) by mouth daily.    Dispense:  30 tablet    Refill:  3    Discontinue previous dose  .  lisinopril-hydrochlorothiazide (PRINZIDE,ZESTORETIC) 20-25 MG tablet    Sig: Take 1 tablet by mouth daily.    Dispense:  30 tablet    Refill:  3  . metFORMIN (GLUCOPHAGE) 1000 MG tablet    Sig: Take 1 tablet (1,000 mg total) by mouth 2 (two) times daily with a meal.    Dispense:  60 tablet    Refill:  3  . pioglitazone (ACTOS) 45 MG tablet    Sig: Take 1 tablet (45 mg total) by mouth daily.    Dispense:  30 tablet    Refill:  3    Follow-up: Return in about 3 months (around 08/27/2017) for follow up of chronic medical conditions.   Charlott Rakes MD

## 2017-05-27 NOTE — Patient Instructions (Signed)
Diabetes mellitus y nutrición  Diabetes Mellitus and Nutrition  Si sufre de diabetes (diabetes mellitus), es muy importante tener hábitos alimenticios saludables debido a que sus niveles de azúcar en la sangre (glucosa) se ven afectados en gran medida por lo que come y bebe. Comer alimentos saludables en las cantidades adecuadas, aproximadamente a la misma hora todos los días, lo ayudará a:  · Controlar la glucemia.  · Disminuir el riesgo de sufrir una enfermedad cardíaca.  · Mejorar la presión arterial.  · Alcanzar o mantener un peso saludable.    Todas las personas que sufren de diabetes son diferentes y cada una tiene necesidades diferentes en cuanto a un plan de alimentación. El médico puede recomendarle que trabaje con un especialista en dietas y nutrición (nutricionista) para elaborar el mejor plan para usted. Su plan de alimentación puede variar según factores como:  · Las calorías que necesita.  · Los medicamentos que toma.  · Su peso.  · Sus niveles de glucemia, presión arterial y colesterol.  · Su nivel de actividad.  · Otras afecciones que tenga, como enfermedades cardíacas o renales.    ¿Cómo me afectan los carbohidratos?  Los carbohidratos afectan el nivel de glucemia más que cualquier otro tipo de alimento. La ingesta de carbohidratos naturalmente aumenta la cantidad glucosa en la sangre. El recuento de carbohidratos es un método destinado a llevar un registro de la cantidad de carbohidratos que se ingieren. El recuento de carbohidratos es importante para mantener la glucemia a un nivel saludable, en especial si utiliza insulina o toma determinados medicamentos por vía oral para la diabetes.  Es importante saber la cantidad de carbohidratos que se pueden ingerir en cada comida sin correr ningún riesgo. Esto es diferente en cada persona. El nutricionista puede ayudarlo a calcular la cantidad de carbohidratos que debe ingerir en cada comida y colación.   Los alimentos que contienen carbohidratos incluyen:  · Pan, cereal, arroz, pasta y galletas.  · Papas y maíz.  · Guisantes, frijoles y lentejas.  · Leche y yogur.  · Frutas y jugo.  · Postres, como pasteles, galletitas, helado y caramelos.    ¿Cómo me afecta el alcohol?  El alcohol puede provocar disminuciones súbitas de la glucemia (hipoglucemia), en especial si utiliza insulina o toma determinados medicamentos por vía oral para la diabetes. La hipoglucemia es una afección potencialmente mortal. Los síntomas de la hipoglucemia (somnolencia, mareos y confusión) son similares a los síntomas de haber consumido demasiado alcohol.  Si el médico afirma que el alcohol es seguro para usted, siga estas pautas:  · Limite el consumo de alcohol a no más de 1 medida por día si es mujer y no está embarazada, y a 2 medidas si es hombre. Una medida equivale a 12 oz (355 ml) de cerveza, 5 oz (148 ml) de vino o 1½ oz (44 ml) de bebidas de alta graduación alcohólica.  · No beba con el estómago vacío.  · Manténgase hidratado con agua, gaseosas dietéticas o té helado sin azúcar.  · Tenga en cuenta que las gaseosas comunes, los jugos y otros refrescos pueden contener mucha azúcar y se deben contar como carbohidratos.    Consejos para seguir este plan  Leer las etiquetas de los alimentos  · Comience por controlar el tamaño de la porción en la etiqueta. La cantidad de calorías, carbohidratos, grasas y otros nutrientes mencionados en la etiqueta se basan en una porción del alimento. Muchos alimentos contienen más de una porción por envase.  · Verifique la cantidad total de gramos (g)   de carbohidratos totales en una porción. Puede calcular la cantidad de porciones de carbohidratos al dividir el total de carbohidratos por 15. Por ejemplo, si un alimento posee un total de 30 g de carbohidratos, equivale a 2 porciones de carbohidratos.  · Verifique la cantidad de gramos (g) de grasas saturadas y grasas trans  en una porción. Escoja alimentos que no contengan grasa o que tengan un bajo contenido.  · Controle la cantidad de miligramos (mg) de sodio en una porción. La mayoría de las personas deben limitar la ingesta de sodio total a menos de 2300 mg por día.  · Siempre consulte la información nutricional de los alimentos etiquetados como “con bajo contenido de grasa” o “sin grasa”. Estos alimentos pueden ser más altos en azúcar agregada o en carbohidratos refinados y deben evitarse.  · Hable con el nutricionista para identificar sus objetivos diarios en cuanto a los nutrientes mencionados en la etiqueta.  De compras  · Evite comprar alimentos procesados, enlatados o prehechos. Estos alimentos tienden a tener mayor cantidad de grasa, sodio y azúcar agregada.  · Compre en la zona exterior de la tienda de comestibles. Esta incluye frutas y vegetales frescos, granos a granel, carnes frescas y productos lácteos frescos.  Cocción  · Utilice métodos de cocción a baja temperatura, como hornear, en lugar de métodos de cocción a alta temperatura, como freír en abundante aceite.  · Cocine con aceites saludables, como el aceite de oliva, canola o girasol.  · Evite cocinar con manteca, crema o carnes con alto contenido de grasa.  Planificación de las comidas  · Consuma las comidas y las colaciones de forma regular, preferentemente a la misma hora todos los días. Evite pasar largos períodos de tiempo sin comer.  · Consuma alimentos ricos en fibra, como frutas frescas, verduras, frijoles y cereales integrales. Consulte al nutricionista sobre cuántas porciones de carbohidratos puede consumir en cada comida.  · Consuma entre 4 y 6 onzas de proteínas magras por día, como carnes magras, pollo, pescado, huevos o tofu. 1 onza equivale a 1 onza de carne, pollo o pescado, 1 huevo, o 1/4 taza de tofu.  · Coma algunos alimentos por día que contengan grasas saludables, como aguacates, frutos secos, semillas y pescado.  Estilo de vida     · Controle su nivel de glucemia con regularidad.  · Haga ejercicio al menos 30 minutos, 5 días o más por semana, o como se lo haya indicado el médico.  · Tome los medicamentos como se lo haya indicado el médico.  · No consuma ningún producto que contenga nicotina o tabaco, como cigarrillos y cigarrillos electrónicos. Si necesita ayuda para dejar de fumar, consulte al médico.  · Trabaje con un asesor o instructor en diabetes para identificar estrategias para controlar el estrés y cualquier desafío emocional y social.  ¿Cuáles son algunas de las preguntas que puedo hacerle a mi médico?  · ¿Es necesario que me reúna con un instructor en diabetes?  · ¿Es necesario que me reúna con un nutricionista?  · ¿A qué número puedo llamar si tengo preguntas?  · ¿Cuáles son los mejores momentos para controlar la glucemia?  Dónde encontrar más información:  · Asociación Americana de la Diabetes (American Diabetes Association): diabetes.org/food-and-fitness/food  · Academia de Nutrición y Dietética (Academy of Nutrition and Dietetics): www.eatright.org/resources/health/diseases-and-conditions/diabetes  · Instituto Nacional de la Diabetes y las Enfermedades Digestivas y Renales (National Institute of Diabetes and Digestive and Kidney Diseases) (Institutos Nacionales de Salud, NIH): www.niddk.nih.gov/health-information/diabetes/overview/diet-eating-physical-activity  Resumen  · Un plan de alimentación saludable   lo ayudará a controlar la glucemia y mantener un estilo de vida saludable.  · Trabajar con un especialista en dietas y nutrición (nutricionista) puede ayudarlo a elaborar el mejor plan de alimentación para usted.  · Tenga en cuenta que los carbohidratos y el alcohol tienen efectos inmediatos en sus niveles de glucemia. Es importante contar los carbohidratos y consumir alcohol con prudencia.  Esta información no tiene como fin reemplazar el consejo del médico. Asegúrese de hacerle al médico cualquier pregunta que tenga.   Document Released: 04/09/2007 Document Revised: 04/22/2016 Document Reviewed: 04/22/2016  Elsevier Interactive Patient Education © 2018 Elsevier Inc.

## 2017-06-02 ENCOUNTER — Ambulatory Visit: Payer: Self-pay | Attending: Family Medicine | Admitting: Family Medicine

## 2017-06-02 ENCOUNTER — Encounter: Payer: Self-pay | Admitting: Family Medicine

## 2017-06-02 VITALS — BP 128/72 | HR 100 | Temp 98.2°F | Wt 164.4 lb

## 2017-06-02 DIAGNOSIS — Z7982 Long term (current) use of aspirin: Secondary | ICD-10-CM | POA: Insufficient documentation

## 2017-06-02 DIAGNOSIS — Z7984 Long term (current) use of oral hypoglycemic drugs: Secondary | ICD-10-CM | POA: Insufficient documentation

## 2017-06-02 DIAGNOSIS — E039 Hypothyroidism, unspecified: Secondary | ICD-10-CM | POA: Insufficient documentation

## 2017-06-02 DIAGNOSIS — H9202 Otalgia, left ear: Secondary | ICD-10-CM | POA: Insufficient documentation

## 2017-06-02 DIAGNOSIS — M791 Myalgia, unspecified site: Secondary | ICD-10-CM | POA: Insufficient documentation

## 2017-06-02 DIAGNOSIS — E1122 Type 2 diabetes mellitus with diabetic chronic kidney disease: Secondary | ICD-10-CM

## 2017-06-02 DIAGNOSIS — H65112 Acute and subacute allergic otitis media (mucoid) (sanguinous) (serous), left ear: Secondary | ICD-10-CM

## 2017-06-02 DIAGNOSIS — J0110 Acute frontal sinusitis, unspecified: Secondary | ICD-10-CM

## 2017-06-02 DIAGNOSIS — H6692 Otitis media, unspecified, left ear: Secondary | ICD-10-CM | POA: Insufficient documentation

## 2017-06-02 DIAGNOSIS — E785 Hyperlipidemia, unspecified: Secondary | ICD-10-CM | POA: Insufficient documentation

## 2017-06-02 DIAGNOSIS — I129 Hypertensive chronic kidney disease with stage 1 through stage 4 chronic kidney disease, or unspecified chronic kidney disease: Secondary | ICD-10-CM | POA: Insufficient documentation

## 2017-06-02 DIAGNOSIS — J029 Acute pharyngitis, unspecified: Secondary | ICD-10-CM | POA: Insufficient documentation

## 2017-06-02 DIAGNOSIS — Z791 Long term (current) use of non-steroidal anti-inflammatories (NSAID): Secondary | ICD-10-CM | POA: Insufficient documentation

## 2017-06-02 DIAGNOSIS — Z79899 Other long term (current) drug therapy: Secondary | ICD-10-CM | POA: Insufficient documentation

## 2017-06-02 DIAGNOSIS — R05 Cough: Secondary | ICD-10-CM | POA: Insufficient documentation

## 2017-06-02 DIAGNOSIS — N183 Chronic kidney disease, stage 3 (moderate): Secondary | ICD-10-CM

## 2017-06-02 LAB — GLUCOSE, POCT (MANUAL RESULT ENTRY): POC Glucose: 234 mg/dl — AB (ref 70–99)

## 2017-06-02 MED ORDER — AMOXICILLIN 500 MG PO CAPS: 500.0000 mg | ORAL_CAPSULE | Freq: Three times a day (TID) | ORAL | 0 refills | Status: DC

## 2017-06-02 MED ORDER — OLOPATADINE HCL 0.1 % OP SOLN: 1.0000 [drp] | Freq: Two times a day (BID) | OPHTHALMIC | 1 refills | Status: DC

## 2017-06-02 MED ORDER — CETIRIZINE HCL 10 MG PO TABS: 10.0000 mg | ORAL_TABLET | Freq: Every day | ORAL | 1 refills | Status: DC

## 2017-06-02 MED FILL — OLOPATADINE HCL 0.1% EYE DR: 0.1 | 18 days supply | Qty: 5 | Fill #0

## 2017-06-02 MED FILL — ?CETIRIZINE HCL 10 MG TABLE: 10 | 30 days supply | Qty: 30 | Fill #0

## 2017-06-02 MED FILL — AMOXICILLIN 500 MG CAPSULE: 500 | 10 days supply | Qty: 30 | Fill #0

## 2017-06-02 NOTE — Progress Notes (Signed)
Subjective:  Patient ID: Linda House, female    DOB: 1945/11/05  Age: 72 y.o. MRN: [id number]  CC: Cough; Sore Throat; and Ear Pain   HPI Linda House is a 72 year old female with a history of uncontrolled type 2 diabetes mellitus (A1c 10.2), hypothyroidism, hyperlipidemia, hypertension who comes in for an acute visit complaining of a one-week history of cough productive of whitish sputum, left ear pain, burning of both eyes and tearing, postnasal drip and sore throat. She denies fevers but has had some myalgias and headache and OTC analgesics have not provided relief. She denies chest pains, shortness of breath or wheezing.  Past Medical History:  Diagnosis Date  . Diabetes mellitus   . Diabetes mellitus without complication ([place])   . Hypertension     History reviewed. No pertinent surgical history.  No Known Allergies   Outpatient Medications Prior to Visit  Medication Sig Dispense Refill  . amLODipine (NORVASC) 10 MG tablet Take 1 tablet (10 mg total) by mouth daily. 30 tablet 3  . aspirin EC 81 MG tablet Take 1 tablet (81 mg total) by mouth daily. 30 tablet 3  . atorvastatin (LIPITOR) 40 MG tablet Take 1 tablet (40 mg total) by mouth daily. 30 tablet 3  . glipiZIDE (GLUCOTROL XL) 10 MG 24 hr tablet Take 2 tablets (20 mg total) by mouth daily. 60 tablet 3  . glucose blood test strip Use as instructed 100 each 12  . glucose monitoring kit (FREESTYLE) monitoring kit 1 each by Does not apply route as needed. 1 each 0  . Lancets (FREESTYLE) lancets Use as instructed 100 each 12  . levothyroxine (SYNTHROID, LEVOTHROID) 137 MCG tablet Take 1 tablet (137 mcg total) by mouth daily. 30 tablet 3  . lisinopril-hydrochlorothiazide (PRINZIDE,ZESTORETIC) 20-25 MG tablet Take 1 tablet by mouth daily. 30 tablet 3  . meloxicam (MOBIC) 7.5 MG tablet Take 1 tablet (7.5 mg total) by mouth daily. 30 tablet 3  . metFORMIN (GLUCOPHAGE) 1000 MG tablet Take 1 tablet (1,000  mg total) by mouth 2 (two) times daily with a meal. 60 tablet 3  . pioglitazone (ACTOS) 45 MG tablet Take 1 tablet (45 mg total) by mouth daily. 30 tablet 3   No facility-administered medications prior to visit.     ROS Review of Systems  Constitutional: Negative for activity change, appetite change and fatigue.  HENT: Positive for ear pain, postnasal drip and sore throat. Negative for congestion and sinus pressure.   Eyes: Positive for discharge. Negative for visual disturbance.  Respiratory: Positive for cough. Negative for chest tightness, shortness of breath and wheezing.   Cardiovascular: Negative for chest pain and palpitations.  Gastrointestinal: Negative for abdominal distention, abdominal pain and constipation.  Endocrine: Negative for polydipsia.  Genitourinary: Negative for dysuria and frequency.  Musculoskeletal: Negative for arthralgias and back pain.  Skin: Negative for rash.  Neurological: Positive for headaches. Negative for tremors, light-headedness and numbness.  Hematological: Does not bruise/bleed easily.  Psychiatric/Behavioral: Negative for agitation and behavioral problems.    Objective:  BP 128/72   Pulse 100   Temp 98.2 F (36.8 C) (Oral)   Wt 164 lb 6.4 oz (74.6 kg)   SpO2 97%   BMI 32.65 kg/m   BP/Weight 06/02/2017 05/27/2017 5/42/7062  Systolic BP 376 283 151  Diastolic BP 72 74 74  Wt. (Lbs) 164.4 163.4 167.8  BMI 32.65 32.45 33.32      Physical Exam  Constitutional: She is oriented to person, place, and time.  She appears well-developed and well-nourished.  HENT:  Right Ear: Tympanic membrane is not injected. No middle ear effusion.  Left Ear: Tympanic membrane is injected.  No middle ear effusion.  Eyes:  Tearing of left eye  Cardiovascular: Normal rate, normal heart sounds and intact distal pulses.  No murmur heard. Pulmonary/Chest: Effort normal and breath sounds normal. She has no wheezes. She has no rales. She exhibits no tenderness.    Abdominal: Soft. Bowel sounds are normal. She exhibits no distension and no mass. There is no tenderness.  Musculoskeletal: Normal range of motion.  Neurological: She is alert and oriented to person, place, and time.  Psychiatric: She has a normal mood and affect.     Lab Results  Component Value Date   HGBA1C 10.2 05/27/2017    Assessment & Plan:   1. Type 2 diabetes mellitus with stage 3 chronic kidney disease, without long-term current use of insulin (HCC) Uncontrolled with A1c of 10.2 Regimen had been adjusted at last visit Continue current medications and diabetic diet - POCT glucose (manual entry)  2. Non-recurrent acute allergic otitis media of left ear - amoxicillin (AMOXIL) 500 MG capsule; Take 1 capsule (500 mg total) by mouth 3 (three) times daily.  Dispense: 30 capsule; Refill: 0  3. Acute non-recurrent frontal sinusitis - amoxicillin (AMOXIL) 500 MG capsule; Take 1 capsule (500 mg total) by mouth 3 (three) times daily.  Dispense: 30 capsule; Refill: 0 - cetirizine (ZYRTEC) 10 MG tablet; Take 1 tablet (10 mg total) by mouth daily.  Dispense: 30 tablet; Refill: 1   Meds ordered this encounter  Medications  . amoxicillin (AMOXIL) 500 MG capsule    Sig: Take 1 capsule (500 mg total) by mouth 3 (three) times daily.    Dispense:  30 capsule    Refill:  0  . cetirizine (ZYRTEC) 10 MG tablet    Sig: Take 1 tablet (10 mg total) by mouth daily.    Dispense:  30 tablet    Refill:  1  . olopatadine (PATANOL) 0.1 % ophthalmic solution    Sig: Place 1 drop into both eyes 2 (two) times daily.    Dispense:  5 mL    Refill:  1    Follow-up: Return for follow up of chronic medical conditions, keep previously scheduled appointment.   Charlott Rakes MD

## 2017-06-02 NOTE — Patient Instructions (Signed)
Otitis media - Adultos  (Otitis Media, Adult)  La otitis media es la irritacin, dolor e inflamacin (hinchazn) en el espacio que se encuentra detrs del tmpano (odo medio). La causa puede ser una alergia o una infeccin. Generalmente aparece junto con un resfro.  CUIDADOS EN EL HOGAR   Tome los medicamentos segn las indicaciones. Finalice la prescripcin completa, aunque se sienta mejor.   Solo tome medicamentos de venta libre o recetados para el dolor, malestar o fiebre, como le indique el mdico.   Concurra a las consultas de control con el mdico, segn las indicaciones.    SOLICITE AYUDA SI:   Tiene otitis media slo en un odo o sangra por la nariz, o ambas cosas.   Advierte un bulto en el cuello.   No mejora luego de 3-5 das.   Empeora en lugar de mejorar.    SOLICITE AYUDA DE INMEDIATO SI:   Siente un dolor intenso y no lo alivian los medicamentos.   Tiene irritacin, hinchazn o dolor en el odo.   Presenta rigidez en el cuello.   No puede mover una parte de su rostro (parlisis).   Nota que el hueso que se encuentra detrs de su oreja le duele al tocarlo.    ASEGRESE DE QUE:   Comprende estas instrucciones.   Controlar su afeccin.   Recibir ayuda de inmediato si no mejora o si empeora.    Esta informacin no tiene como fin reemplazar el consejo del mdico. Asegrese de hacerle al mdico cualquier pregunta que tenga.  Document Released: 02/02/2010 Document Revised: 01/21/2014 Document Reviewed: 07/28/2012  Elsevier Interactive Patient Education  2017 [employer] Inc[employer]

## 2017-07-15 MED FILL — ?LEVOTHYROXINE 137 MCG TAB: 137 | 30 days supply | Qty: 30 | Fill #1

## 2017-07-15 MED FILL — ?ATORVASTATIN 40MG TABLET: 40 | 30 days supply | Qty: 30 | Fill #1

## 2017-07-15 MED FILL — LISINOPRIL-HCTZ 20-25 MG TA: 20-25 | 30 days supply | Qty: 30 | Fill #1

## 2017-07-15 MED FILL — AMLODIPINE BESYLATE 10 MG T: 10 | 30 days supply | Qty: 30 | Fill #1

## 2017-07-15 MED FILL — ?METFORMIN HCL 1,000 MG TAB: 1000 | 30 days supply | Qty: 60 | Fill #1

## 2017-07-15 MED FILL — glipiZIDE XL 10 MG TB24: 10 | 30 days supply | Qty: 60 | Fill #1

## 2017-07-15 MED FILL — PIOGLITAZONE HCL 45 MG TAB: 45 | 30 days supply | Qty: 30 | Fill #1

## 2017-08-27 ENCOUNTER — Ambulatory Visit: Payer: Self-pay | Attending: Family Medicine | Admitting: Family Medicine

## 2017-08-27 ENCOUNTER — Encounter: Payer: Self-pay | Admitting: Family Medicine

## 2017-08-27 ENCOUNTER — Other Ambulatory Visit: Payer: Self-pay

## 2017-08-27 VITALS — BP 127/81 | HR 80 | Temp 98.3°F | Resp 16 | Ht 59.5 in | Wt 161.8 lb

## 2017-08-27 DIAGNOSIS — Z7984 Long term (current) use of oral hypoglycemic drugs: Secondary | ICD-10-CM | POA: Insufficient documentation

## 2017-08-27 DIAGNOSIS — E1149 Type 2 diabetes mellitus with other diabetic neurological complication: Secondary | ICD-10-CM | POA: Insufficient documentation

## 2017-08-27 DIAGNOSIS — Z79890 Hormone replacement therapy (postmenopausal): Secondary | ICD-10-CM | POA: Insufficient documentation

## 2017-08-27 DIAGNOSIS — I10 Essential (primary) hypertension: Secondary | ICD-10-CM | POA: Insufficient documentation

## 2017-08-27 DIAGNOSIS — Z7982 Long term (current) use of aspirin: Secondary | ICD-10-CM | POA: Insufficient documentation

## 2017-08-27 DIAGNOSIS — M1712 Unilateral primary osteoarthritis, left knee: Secondary | ICD-10-CM | POA: Insufficient documentation

## 2017-08-27 DIAGNOSIS — E039 Hypothyroidism, unspecified: Secondary | ICD-10-CM | POA: Insufficient documentation

## 2017-08-27 DIAGNOSIS — E7800 Pure hypercholesterolemia, unspecified: Secondary | ICD-10-CM | POA: Insufficient documentation

## 2017-08-27 DIAGNOSIS — R252 Cramp and spasm: Secondary | ICD-10-CM | POA: Insufficient documentation

## 2017-08-27 DIAGNOSIS — Z79899 Other long term (current) drug therapy: Secondary | ICD-10-CM | POA: Insufficient documentation

## 2017-08-27 DIAGNOSIS — Z791 Long term (current) use of non-steroidal anti-inflammatories (NSAID): Secondary | ICD-10-CM | POA: Insufficient documentation

## 2017-08-27 DIAGNOSIS — E119 Type 2 diabetes mellitus without complications: Secondary | ICD-10-CM

## 2017-08-27 LAB — POCT GLYCOSYLATED HEMOGLOBIN (HGB A1C): HBA1C, POC (CONTROLLED DIABETIC RANGE): 8.4 % — AB (ref 0.0–7.0)

## 2017-08-27 LAB — GLUCOSE, POCT (MANUAL RESULT ENTRY): POC Glucose: 112 mg/dl — AB (ref 70–99)

## 2017-08-27 MED ORDER — GABAPENTIN 300 MG PO CAPS: 300.0000 mg | ORAL_CAPSULE | Freq: Every day | ORAL | 3 refills | Status: DC

## 2017-08-27 MED ORDER — CYCLOBENZAPRINE HCL 10 MG PO TABS: 10.0000 mg | ORAL_TABLET | Freq: Every day | ORAL | 1 refills | Status: DC

## 2017-08-27 MED ORDER — GLIPIZIDE ER 10 MG PO TB24: 20.0000 mg | ORAL_TABLET | Freq: Every day | ORAL | 3 refills | Status: DC

## 2017-08-27 MED ORDER — AMLODIPINE BESYLATE 10 MG PO TABS: 10.0000 mg | ORAL_TABLET | Freq: Every day | ORAL | 3 refills | Status: DC

## 2017-08-27 MED ORDER — METFORMIN HCL 1000 MG PO TABS: 1000.0000 mg | ORAL_TABLET | Freq: Two times a day (BID) | ORAL | 3 refills | Status: DC

## 2017-08-27 MED ORDER — ATORVASTATIN CALCIUM 40 MG PO TABS: 40.0000 mg | ORAL_TABLET | Freq: Every day | ORAL | 3 refills | Status: DC

## 2017-08-27 MED ORDER — LISINOPRIL-HYDROCHLOROTHIAZIDE 20-25 MG PO TABS
1.0000 | ORAL_TABLET | Freq: Every day | ORAL | 3 refills | Status: DC
Start: 2017-08-27 — End: 2018-01-01

## 2017-08-27 MED ORDER — PIOGLITAZONE HCL 45 MG PO TABS: 45.0000 mg | ORAL_TABLET | Freq: Every day | ORAL | 3 refills | Status: DC

## 2017-08-27 MED ORDER — MELOXICAM 7.5 MG PO TABS: 7.5000 mg | ORAL_TABLET | Freq: Every day | ORAL | 3 refills | Status: DC

## 2017-08-27 NOTE — Progress Notes (Signed)
Subjective:  Patient ID: Linda House, female    DOB: 1945/08/05  Age: 72 y.o. MRN: [id number]  CC: Follow-up   HPI Linda House is a 72 year old female with a history of uncontrolled type 2 diabetes mellitus (A1c 8.4), hypothyroidism, hyperlipidemia, hypertension who comes Her A1c is 8.4 which has trended up from 10.2 previously and she endorses working on a diabetic diet and exercising.  Denies hypoglycemia or blurry vision. She complains of right leg pain which feel like cramps that have occurred for the last 2 months.  She does have associated numbness but denies pins-and-needles sensation.  Her symptoms cause her to feel more well rested when waking up in the morning especially when she has myalgias as well.  She sleeps 4 to 5 hours per night. Doing well on her levothyroxine and tolerating her statin and is also tolerating her antihypertensive.  Denies chest pains, shortness of breath.  Past Medical History:  Diagnosis Date  . Diabetes mellitus   . Diabetes mellitus without complication ([place])   . Hypertension     No past surgical history on file.  No Known Allergies   Outpatient Medications Prior to Visit  Medication Sig Dispense Refill  . aspirin EC 81 MG tablet Take 1 tablet (81 mg total) by mouth daily. 30 tablet 3  . cetirizine (ZYRTEC) 10 MG tablet Take 1 tablet (10 mg total) by mouth daily. 30 tablet 1  . glucose blood test strip Use as instructed 100 each 12  . glucose monitoring kit (FREESTYLE) monitoring kit 1 each by Does not apply route as needed. 1 each 0  . Lancets (FREESTYLE) lancets Use as instructed 100 each 12  . levothyroxine (SYNTHROID, LEVOTHROID) 137 MCG tablet Take 1 tablet (137 mcg total) by mouth daily. 30 tablet 3  . olopatadine (PATANOL) 0.1 % ophthalmic solution Place 1 drop into both eyes 2 (two) times daily. 5 mL 1  . amLODipine (NORVASC) 10 MG tablet Take 1 tablet (10 mg total) by mouth daily. 30 tablet 3  .  atorvastatin (LIPITOR) 40 MG tablet Take 1 tablet (40 mg total) by mouth daily. 30 tablet 3  . glipiZIDE (GLUCOTROL XL) 10 MG 24 hr tablet Take 2 tablets (20 mg total) by mouth daily. 60 tablet 3  . lisinopril-hydrochlorothiazide (PRINZIDE,ZESTORETIC) 20-25 MG tablet Take 1 tablet by mouth daily. 30 tablet 3  . meloxicam (MOBIC) 7.5 MG tablet Take 1 tablet (7.5 mg total) by mouth daily. 30 tablet 3  . metFORMIN (GLUCOPHAGE) 1000 MG tablet Take 1 tablet (1,000 mg total) by mouth 2 (two) times daily with a meal. 60 tablet 3  . pioglitazone (ACTOS) 45 MG tablet Take 1 tablet (45 mg total) by mouth daily. 30 tablet 3  . amoxicillin (AMOXIL) 500 MG capsule Take 1 capsule (500 mg total) by mouth 3 (three) times daily. (Patient not taking: Reported on 08/27/2017) 30 capsule 0   No facility-administered medications prior to visit.     ROS Review of Systems  Constitutional: Negative for activity change, appetite change and fatigue.  HENT: Negative for congestion, sinus pressure and sore throat.   Eyes: Negative for visual disturbance.  Respiratory: Negative for cough, chest tightness, shortness of breath and wheezing.   Cardiovascular: Negative for chest pain and palpitations.  Gastrointestinal: Negative for abdominal distention, abdominal pain and constipation.  Endocrine: Negative for polydipsia.  Genitourinary: Negative for dysuria and frequency.  Musculoskeletal: Negative for arthralgias and back pain.  Skin: Negative for rash.  Neurological: Negative for  tremors, light-headedness and numbness.  Hematological: Does not bruise/bleed easily.  Psychiatric/Behavioral: Negative for agitation and behavioral problems.    Objective:  BP 127/81 (BP Location: Right Arm, Patient Position: Sitting, Cuff Size: Large)   Pulse 80   Temp 98.3 F (36.8 C) (Oral)   Resp 16   Ht 4' 11.5" (1.511 m)   Wt 161 lb 12.8 oz (73.4 kg)   SpO2 97%   BMI 32.13 kg/m   BP/Weight 08/27/2017 06/02/2017 8/76/8115    Systolic BP 726 203 559  Diastolic BP 81 72 74  Wt. (Lbs) 161.8 164.4 163.4  BMI 32.13 32.65 32.45      Physical Exam  Constitutional: She is oriented to person, place, and time. She appears well-developed and well-nourished.  Cardiovascular: Normal rate, normal heart sounds and intact distal pulses.  No murmur heard. Pulmonary/Chest: Effort normal and breath sounds normal. She has no wheezes. She has no rales. She exhibits no tenderness.  Abdominal: Soft. Bowel sounds are normal. She exhibits no distension and no mass. There is no tenderness.  Musculoskeletal: Normal range of motion.  Neurological: She is alert and oriented to person, place, and time.  Skin: Skin is warm and dry.  Psychiatric: She has a normal mood and affect.     CMP Latest Ref Rng & Units 02/28/2017 08/27/2016 02/19/2016  Glucose 65 - 99 mg/dL 93 136(H) 123(H)  BUN 8 - 27 mg/dL _0 Creatinine 0.57 - 1.00 mg/dL 0.60 0.61 0.74  Sodium 134 - 144 mmol/L 140 140 139  Potassium 3.5 - 5.2 mmol/L 4.4 4.5 4.3  Chloride 96 - 106 mmol/L 102 103 101  CO2 20 - 29 mmol/L _1 Calcium 8.7 - 10.3 mg/dL 9.3 8.7 9.4  Total Protein 6.0 - 8.5 g/dL 7.3 7.4 7.6  Total Bilirubin 0.0 - 1.2 mg/dL 0.2 0.2 0.5  Alkaline Phos 39 - 117 IU/L 136(H) 161(H) 140(H)  AST 0 - 40 IU/L _2 ALT 0 - 32 IU/L _3 Lipid Panel     Component Value Date/Time   CHOL 239 (H) 02/28/2017 0917   TRIG 125 02/28/2017 0917   HDL 59 02/28/2017 0917   CHOLHDL 4.1 02/28/2017 0917   CHOLHDL 3.4 02/19/2016 1021   VLDL 25 08/29/2015 0849   LDLCALC 155 (H) 02/28/2017 0917    Assessment & Plan:   1. Type 2 diabetes mellitus with other neurologic complication, without long-term current use of insulin (HCC) Uncontrolled with A1c of 8.4 which has improved from 10.2 previously Lower extremity symptoms suspicious for neuropathy Commence gabapentin-discussed sedating side effects Counseled on Diabetic diet, my plate method, 741 minutes  of moderate intensity exercise/week Keep blood sugar logs with fasting goals of 80-120 mg/dl, random of less than 180 and in the event of sugars less than 60 mg/dl or greater than 400 mg/dl please notify the clinic ASAP. It is recommended that you undergo annual eye exams and annual foot exams. Pneumonia vaccine is recommended. - POCT glucose (manual entry) - POCT glycosylated hemoglobin (Hb A1C) - gabapentin (NEURONTIN) 300 MG capsule; Take 1 capsule (300 mg total) by mouth at bedtime.  Dispense: 30 capsule; Refill: 3 - pioglitazone (ACTOS) 45 MG tablet; Take 1 tablet (45 mg total) by mouth daily.  Dispense: 30 tablet; Refill: 3 - metFORMIN (GLUCOPHAGE) 1000 MG tablet; Take 1 tablet (1,000 mg total) by mouth 2 (two) times daily with a meal.  Dispense: 60 tablet; Refill: 3 - glipiZIDE (GLUCOTROL  XL) 10 MG 24 hr tablet; Take 2 tablets (20 mg total) by mouth daily.  Dispense: 60 tablet; Refill: 3 - CMP14+EGFR - Lipid panel  2. Primary osteoarthritis of left knee Stable - meloxicam (MOBIC) 7.5 MG tablet; Take 1 tablet (7.5 mg total) by mouth daily.  Dispense: 30 tablet; Refill: 3  3. Essential hypertension, benign Controlled Counseled on blood pressure goal of less than 130/80, low-sodium, DASH diet, medication compliance, 150 minutes of moderate intensity exercise per week. Discussed medication compliance, adverse effects. - lisinopril-hydrochlorothiazide (PRINZIDE,ZESTORETIC) 20-25 MG tablet; Take 1 tablet by mouth daily.  Dispense: 30 tablet; Refill: 3 - amLODipine (NORVASC) 10 MG tablet; Take 1 tablet (10 mg total) by mouth daily.  Dispense: 30 tablet; Refill: 3  4. Hypothyroidism, unspecified type Uncontrolled We will order TSH and adjust levothyroxine dose accordingly - T4, free - TSH  5. Pure hypercholesterolemia Uncontrolled Low-cholesterol diet Lipid panel today and will adjust Lipitor - atorvastatin (LIPITOR) 40 MG tablet; Take 1 tablet (40 mg total) by mouth daily.   Dispense: 30 tablet; Refill: 3  6. Muscle cramp Could be causing restlessness and insomnia hopefully sedating side effects of Flexeril and gabapentin should help with this. - cyclobenzaprine (FLEXERIL) 10 MG tablet; Take 1 tablet (10 mg total) by mouth at bedtime.  Dispense: 30 tablet; Refill: 1   Meds ordered this encounter  Medications  . gabapentin (NEURONTIN) 300 MG capsule    Sig: Take 1 capsule (300 mg total) by mouth at bedtime.    Dispense:  30 capsule    Refill:  3  . cyclobenzaprine (FLEXERIL) 10 MG tablet    Sig: Take 1 tablet (10 mg total) by mouth at bedtime.    Dispense:  30 tablet    Refill:  1  . pioglitazone (ACTOS) 45 MG tablet    Sig: Take 1 tablet (45 mg total) by mouth daily.    Dispense:  30 tablet    Refill:  3  . metFORMIN (GLUCOPHAGE) 1000 MG tablet    Sig: Take 1 tablet (1,000 mg total) by mouth 2 (two) times daily with a meal.    Dispense:  60 tablet    Refill:  3  . meloxicam (MOBIC) 7.5 MG tablet    Sig: Take 1 tablet (7.5 mg total) by mouth daily.    Dispense:  30 tablet    Refill:  3  . lisinopril-hydrochlorothiazide (PRINZIDE,ZESTORETIC) 20-25 MG tablet    Sig: Take 1 tablet by mouth daily.    Dispense:  30 tablet    Refill:  3  . glipiZIDE (GLUCOTROL XL) 10 MG 24 hr tablet    Sig: Take 2 tablets (20 mg total) by mouth daily.    Dispense:  60 tablet    Refill:  3    Discontinue previous dose  . atorvastatin (LIPITOR) 40 MG tablet    Sig: Take 1 tablet (40 mg total) by mouth daily.    Dispense:  30 tablet    Refill:  3    Discontinue previous dose  . amLODipine (NORVASC) 10 MG tablet    Sig: Take 1 tablet (10 mg total) by mouth daily.    Dispense:  30 tablet    Refill:  3    Follow-up: Return in about 3 months (around 11/27/2017) for Follow-up of chronic medical conditions.   Charlott Rakes MD

## 2017-08-28 ENCOUNTER — Other Ambulatory Visit: Payer: Self-pay | Admitting: Family Medicine

## 2017-08-28 DIAGNOSIS — E039 Hypothyroidism, unspecified: Secondary | ICD-10-CM

## 2017-08-28 LAB — CMP14+EGFR
A/G RATIO: 1.2 (ref 1.2–2.2)
ALT: 12 IU/L (ref 0–32)
AST: 11 IU/L (ref 0–40)
Albumin: 4.1 g/dL (ref 3.5–4.8)
Alkaline Phosphatase: 155 IU/L — ABNORMAL HIGH (ref 39–117)
BUN/Creatinine Ratio: 18 (ref 12–28)
BUN: 14 mg/dL (ref 8–27)
Bilirubin Total: 0.3 mg/dL (ref 0.0–1.2)
CALCIUM: 9.4 mg/dL (ref 8.7–10.3)
CO2: 26 mmol/L (ref 20–29)
Chloride: 101 mmol/L (ref 96–106)
Creatinine, Ser: 0.76 mg/dL (ref 0.57–1.00)
GFR, EST AFRICAN AMERICAN: 91 mL/min/{1.73_m2} (ref >59)
GFR, EST NON AFRICAN AMERICAN: 79 mL/min/{1.73_m2} (ref >59)
Globulin, Total: 3.3 g/dL (ref 1.5–4.5)
Glucose: 93 mg/dL (ref 65–99)
POTASSIUM: 4.4 mmol/L (ref 3.5–5.2)
Sodium: 140 mmol/L (ref 134–144)
TOTAL PROTEIN: 7.4 g/dL (ref 6.0–8.5)

## 2017-08-28 LAB — LIPID PANEL
CHOL/HDL RATIO: 3.7 ratio (ref 0.0–4.4)
Cholesterol, Total: 198 mg/dL (ref 100–199)
HDL: 54 mg/dL (ref >39)
LDL Calculated: 118 mg/dL — ABNORMAL HIGH (ref 0–99)
Triglycerides: 131 mg/dL (ref 0–149)
VLDL Cholesterol Cal: 26 mg/dL (ref 5–40)

## 2017-08-28 LAB — T4, FREE: FREE T4: 1.5 ng/dL (ref 0.82–1.77)

## 2017-08-28 LAB — TSH: TSH: 1.85 u[IU]/mL (ref 0.450–4.500)

## 2017-08-28 MED ORDER — LEVOTHYROXINE SODIUM 137 MCG PO TABS: 137.0000 ug | ORAL_TABLET | Freq: Every day | ORAL | 3 refills | Status: DC

## 2017-09-04 ENCOUNTER — Telehealth: Payer: Self-pay

## 2017-09-04 NOTE — Telephone Encounter (Signed)
Patient was called and informed of lab results via interpreter [facility] [phone].

## 2017-09-04 NOTE — Telephone Encounter (Signed)
-----   Message from Charlott Rakes, MD sent at 08/28/2017  1:20 PM EDT ----- Labs reveal improvement in cholesterol but her LDL is slightly elevated.  Please encourage a low-cholesterol diet your thyroid is normal

## 2017-09-05 MED FILL — glipiZIDE XL 10 MG TB24: 10 | 30 days supply | Qty: 60 | Fill #2

## 2017-09-05 MED FILL — AMLODIPINE BESYLATE 10 MG T: 10 | 30 days supply | Qty: 30 | Fill #2

## 2017-09-05 MED FILL — LISINOPRIL-HCTZ 20-25 MG TA: 20-25 | 30 days supply | Qty: 30 | Fill #2

## 2017-09-05 MED FILL — ATORVASTATIN 40 MG TABLET: 40 | 30 days supply | Qty: 30 | Fill #2

## 2017-09-05 MED FILL — MELOXICAM 7.5 MG TABLET: 7.5 | 30 days supply | Qty: 30 | Fill #0

## 2017-09-05 MED FILL — ?LEVOTHYROXINE 137 MCG TAB: 137 | 30 days supply | Qty: 30 | Fill #2

## 2017-09-05 MED FILL — PIOGLITAZONE HCL 45 MG TAB: 45 | 30 days supply | Qty: 30 | Fill #2

## 2017-09-05 MED FILL — metFORMIN HCL 1000 MG TABS: 1000 | 30 days supply | Qty: 60 | Fill #2

## 2017-09-05 MED FILL — GABAPENTIN 300 MG CAPSULE: 300 | 30 days supply | Qty: 30 | Fill #0

## 2017-09-05 MED FILL — OLOPATADINE HCL 0.1% EYE DR: 0.1 | 18 days supply | Qty: 5 | Fill #1

## 2017-09-05 MED FILL — CYCLOBENZAPRINE 10 MG TAB: 10 | 30 days supply | Qty: 30 | Fill #0

## 2017-10-20 ENCOUNTER — Ambulatory Visit: Payer: Self-pay | Attending: Family Medicine

## 2017-10-27 MED FILL — AMLODIPINE BESYLATE 10 MG T: 10 | 30 days supply | Qty: 30 | Fill #0

## 2017-10-27 MED FILL — ?GLIPIZIDE ER 10 MG TB24: 10 | 30 days supply | Qty: 60 | Fill #3

## 2017-10-27 MED FILL — MELOXICAM 7.5 MG TABLET: 7.5 | 30 days supply | Qty: 30 | Fill #1

## 2017-10-27 MED FILL — ?METFORMIN HCL 1000MG TABS: 1000 | 30 days supply | Qty: 60 | Fill #2

## 2017-10-27 MED FILL — PIOGLITAZONE HCL 45 MG TAB: 45 | 30 days supply | Qty: 30 | Fill #3

## 2017-10-27 MED FILL — LISINOPRIL-HCTZ 20-25 MG TA: 20-25 | 30 days supply | Qty: 30 | Fill #2

## 2017-10-27 MED FILL — ?LEVOTHYROXINE 137 MCG TAB: 137 | 30 days supply | Qty: 30 | Fill #3

## 2017-10-27 MED FILL — ?ATORVASTATIN 40MG TABLET: 40 | 30 days supply | Qty: 30 | Fill #3

## 2017-11-26 ENCOUNTER — Ambulatory Visit: Payer: Self-pay | Admitting: Family Medicine

## 2017-12-10 ENCOUNTER — Ambulatory Visit: Payer: Self-pay | Admitting: Family Medicine

## 2018-01-01 ENCOUNTER — Ambulatory Visit: Payer: Self-pay | Attending: Family Medicine | Admitting: Family Medicine

## 2018-01-01 VITALS — BP 146/78 | HR 81 | Temp 97.4°F | Ht 59.5 in | Wt 166.2 lb

## 2018-01-01 DIAGNOSIS — Z7984 Long term (current) use of oral hypoglycemic drugs: Secondary | ICD-10-CM | POA: Insufficient documentation

## 2018-01-01 DIAGNOSIS — E7800 Pure hypercholesterolemia, unspecified: Secondary | ICD-10-CM | POA: Insufficient documentation

## 2018-01-01 DIAGNOSIS — Z79899 Other long term (current) drug therapy: Secondary | ICD-10-CM | POA: Insufficient documentation

## 2018-01-01 DIAGNOSIS — Z7982 Long term (current) use of aspirin: Secondary | ICD-10-CM | POA: Insufficient documentation

## 2018-01-01 DIAGNOSIS — I10 Essential (primary) hypertension: Secondary | ICD-10-CM | POA: Insufficient documentation

## 2018-01-01 DIAGNOSIS — M1712 Unilateral primary osteoarthritis, left knee: Secondary | ICD-10-CM | POA: Insufficient documentation

## 2018-01-01 DIAGNOSIS — E1149 Type 2 diabetes mellitus with other diabetic neurological complication: Secondary | ICD-10-CM | POA: Insufficient documentation

## 2018-01-01 DIAGNOSIS — Z791 Long term (current) use of non-steroidal anti-inflammatories (NSAID): Secondary | ICD-10-CM | POA: Insufficient documentation

## 2018-01-01 DIAGNOSIS — Z1239 Encounter for other screening for malignant neoplasm of breast: Secondary | ICD-10-CM

## 2018-01-01 DIAGNOSIS — R252 Cramp and spasm: Secondary | ICD-10-CM | POA: Insufficient documentation

## 2018-01-01 DIAGNOSIS — E039 Hypothyroidism, unspecified: Secondary | ICD-10-CM | POA: Insufficient documentation

## 2018-01-01 DIAGNOSIS — Z79890 Hormone replacement therapy (postmenopausal): Secondary | ICD-10-CM | POA: Insufficient documentation

## 2018-01-01 LAB — POCT GLYCOSYLATED HEMOGLOBIN (HGB A1C): Hemoglobin A1C: 8.5 % — AB (ref 4.0–5.6)

## 2018-01-01 LAB — GLUCOSE, POCT (MANUAL RESULT ENTRY): POC Glucose: 131 mg/dl — AB (ref 70–99)

## 2018-01-01 MED ORDER — METFORMIN HCL 1000 MG PO TABS: 1000.0000 mg | ORAL_TABLET | Freq: Two times a day (BID) | ORAL | 3 refills | Status: DC

## 2018-01-01 MED ORDER — EMPAGLIFLOZIN 10 MG PO TABS: 10.0000 mg | ORAL_TABLET | Freq: Every day | ORAL | 3 refills | Status: DC

## 2018-01-01 MED ORDER — LISINOPRIL-HYDROCHLOROTHIAZIDE 20-25 MG PO TABS: 1.0000 | ORAL_TABLET | Freq: Every day | ORAL | 3 refills | Status: DC

## 2018-01-01 MED ORDER — CYCLOBENZAPRINE HCL 10 MG PO TABS: 10.0000 mg | ORAL_TABLET | Freq: Every day | ORAL | 1 refills | Status: DC

## 2018-01-01 MED ORDER — GLIPIZIDE ER 10 MG PO TB24: 20.0000 mg | ORAL_TABLET | Freq: Every day | ORAL | 3 refills | Status: DC

## 2018-01-01 MED ORDER — AMLODIPINE BESYLATE 10 MG PO TABS: 10.0000 mg | ORAL_TABLET | Freq: Every day | ORAL | 3 refills | Status: DC

## 2018-01-01 MED ORDER — ATORVASTATIN CALCIUM 40 MG PO TABS: 40.0000 mg | ORAL_TABLET | Freq: Every day | ORAL | 3 refills | Status: DC

## 2018-01-01 MED ORDER — MELOXICAM 7.5 MG PO TABS: 7.5000 mg | ORAL_TABLET | Freq: Every day | ORAL | 3 refills | Status: DC

## 2018-01-01 MED ORDER — LEVOTHYROXINE SODIUM 137 MCG PO TABS: 137.0000 ug | ORAL_TABLET | Freq: Every day | ORAL | 3 refills | Status: DC

## 2018-01-01 MED ORDER — PIOGLITAZONE HCL 45 MG PO TABS: 45.0000 mg | ORAL_TABLET | Freq: Every day | ORAL | 3 refills | Status: DC

## 2018-01-01 MED ORDER — GABAPENTIN 300 MG PO CAPS: 300.0000 mg | ORAL_CAPSULE | Freq: Every day | ORAL | 3 refills | Status: DC

## 2018-01-01 MED FILL — MELOXICAM 7.5 MG TABLET: 7.5 | 30 days supply | Qty: 30 | Fill #0

## 2018-01-01 MED FILL — ?METFORMIN HCL 1,000 MG TAB: 1000 | 30 days supply | Qty: 60 | Fill #0

## 2018-01-01 MED FILL — AMLODIPINE BESYLATE 10 MG T: 10 | 30 days supply | Qty: 30 | Fill #0

## 2018-01-01 MED FILL — ?ATORVASTATIN 40MG TABLET: 40 | 30 days supply | Qty: 30 | Fill #0

## 2018-01-01 MED FILL — LISINOPRIL-HCTZ 20-25 MG TA: 20-25 | 30 days supply | Qty: 30 | Fill #0

## 2018-01-01 MED FILL — GABAPENTIN 300 MG CAPSULE: 300 | 30 days supply | Qty: 30 | Fill #0

## 2018-01-01 MED FILL — ?GLIPIZIDE ER 10 MG TB24: 10 | 30 days supply | Qty: 60 | Fill #0

## 2018-01-01 MED FILL — CYCLOBENZAPRINE 10 MG TAB: 10 | 30 days supply | Qty: 30 | Fill #0

## 2018-01-01 MED FILL — ?LEVOTHYROXINE 137 MCG TAB: 137 | 30 days supply | Qty: 30 | Fill #0

## 2018-01-01 MED FILL — JARDIANCE 10 MG TABLET: 10 | 30 days supply | Qty: 30 | Fill #0

## 2018-01-01 NOTE — Progress Notes (Signed)
Subjective:  Patient ID: Linda House, female    DOB: 29-Mar-1945  Age: 72 y.o. MRN: [id number]  CC: Diabetes   HPI Linda House is a 72 year old female with a history of uncontrolled type 2 diabetes mellitus (A1c 8.5), hypothyroidism, hyperlipidemia, hypertension who comes in for follow-up visit today. She endorses compliance with her medications but ran out of her diabetic medications 3 days ago.  Denies hypoglycemia, visual concerns or numbness in extremities.  Unable to undergo eye exams due to lack of medical coverage. Tolerating her antihypertensive and statin and denies adverse effects of her medications Her knees hurt especially with the cold weather and the rain and she sometimes has pain in her shoulders.  Her medication list reveals she should be on meloxicam but on review of her bottles today, she does not have meloxicam with her. Denies swelling of her joints and pain is described as mild to moderate. She is compliant with levothyroxine which she takes for hypothyroidism. With regards to healthcare maintenance she is in need of a mammogram, pneumonia vaccine and flu shot but she declines a flu shot today.  Past Medical History:  Diagnosis Date  . Diabetes mellitus   . Diabetes mellitus without complication ([place])   . Hypertension      Outpatient Medications Prior to Visit  Medication Sig Dispense Refill  . aspirin EC 81 MG tablet Take 1 tablet (81 mg total) by mouth daily. 30 tablet 3  . cetirizine (ZYRTEC) 10 MG tablet Take 1 tablet (10 mg total) by mouth daily. 30 tablet 1  . glucose blood test strip Use as instructed 100 each 12  . glucose monitoring kit (FREESTYLE) monitoring kit 1 each by Does not apply route as needed. 1 each 0  . Lancets (FREESTYLE) lancets Use as instructed 100 each 12  . olopatadine (PATANOL) 0.1 % ophthalmic solution Place 1 drop into both eyes 2 (two) times daily. 5 mL 1  . amLODipine (NORVASC) 10 MG tablet Take 1  tablet (10 mg total) by mouth daily. 30 tablet 3  . atorvastatin (LIPITOR) 40 MG tablet Take 1 tablet (40 mg total) by mouth daily. 30 tablet 3  . cyclobenzaprine (FLEXERIL) 10 MG tablet Take 1 tablet (10 mg total) by mouth at bedtime. 30 tablet 1  . gabapentin (NEURONTIN) 300 MG capsule Take 1 capsule (300 mg total) by mouth at bedtime. 30 capsule 3  . glipiZIDE (GLUCOTROL XL) 10 MG 24 hr tablet Take 2 tablets (20 mg total) by mouth daily. 60 tablet 3  . levothyroxine (SYNTHROID, LEVOTHROID) 137 MCG tablet Take 1 tablet (137 mcg total) by mouth daily. 30 tablet 3  . lisinopril-hydrochlorothiazide (PRINZIDE,ZESTORETIC) 20-25 MG tablet Take 1 tablet by mouth daily. 30 tablet 3  . meloxicam (MOBIC) 7.5 MG tablet Take 1 tablet (7.5 mg total) by mouth daily. 30 tablet 3  . metFORMIN (GLUCOPHAGE) 1000 MG tablet Take 1 tablet (1,000 mg total) by mouth 2 (two) times daily with a meal. 60 tablet 3  . pioglitazone (ACTOS) 45 MG tablet Take 1 tablet (45 mg total) by mouth daily. 30 tablet 3  . amoxicillin (AMOXIL) 500 MG capsule Take 1 capsule (500 mg total) by mouth 3 (three) times daily. (Patient not taking: Reported on 08/27/2017) 30 capsule 0   No facility-administered medications prior to visit.     ROS Review of Systems  Constitutional: Negative for activity change, appetite change and fatigue.  HENT: Negative for congestion, sinus pressure and sore throat.   Eyes:  Negative for visual disturbance.  Respiratory: Negative for cough, chest tightness, shortness of breath and wheezing.   Cardiovascular: Negative for chest pain and palpitations.  Gastrointestinal: Negative for abdominal distention, abdominal pain and constipation.  Endocrine: Negative for polydipsia.  Genitourinary: Negative for dysuria and frequency.  Musculoskeletal: Negative for arthralgias and back pain.  Skin: Negative for rash.  Neurological: Negative for tremors, light-headedness and numbness.  Hematological: Does not  bruise/bleed easily.  Psychiatric/Behavioral: Negative for agitation and behavioral problems.    Objective:  BP (!) 146/78   Pulse 81   Temp (!) 97.4 F (36.3 C) (Oral)   Ht 4' 11.5" (1.511 m)   Wt 166 lb 3.2 oz (75.4 kg)   SpO2 97%   BMI 33.01 kg/m   BP/Weight 01/01/2018 08/27/2017 3/00/9233  Systolic BP 007 622 633  Diastolic BP 78 81 72  Wt. (Lbs) 166.2 161.8 164.4  BMI 33.01 32.13 32.65      Physical Exam Constitutional:      Appearance: She is well-developed.  Cardiovascular:     Rate and Rhythm: Normal rate.     Heart sounds: Normal heart sounds. No murmur.  Pulmonary:     Effort: Pulmonary effort is normal.     Breath sounds: Normal breath sounds. No wheezing or rales.  Chest:     Chest wall: No tenderness.  Abdominal:     General: Bowel sounds are normal. There is no distension.     Palpations: Abdomen is soft. There is no mass.     Tenderness: There is no abdominal tenderness.  Musculoskeletal: Normal range of motion.  Neurological:     Mental Status: She is alert and oriented to person, place, and time.  Psychiatric:        Mood and Affect: Mood normal.        Behavior: Behavior normal.     Lab Results  Component Value Date   HGBA1C 8.5 (A) 01/01/2018   Lab Results  Component Value Date   TSH 1.850 08/27/2017     Assessment & Plan:   1. Type 2 diabetes mellitus with other neurologic complication, without long-term current use of insulin (HCC) Uncontrolled with A1c of 8.5 She resists addition of an injectable Jardiance added to regimen Diabetic diet, lifestyle modifications - POCT glucose (manual entry) - POCT glycosylated hemoglobin (Hb A1C) - gabapentin (NEURONTIN) 300 MG capsule; Take 1 capsule (300 mg total) by mouth at bedtime.  Dispense: 30 capsule; Refill: 3 - glipiZIDE (GLUCOTROL XL) 10 MG 24 hr tablet; Take 2 tablets (20 mg total) by mouth daily.  Dispense: 60 tablet; Refill: 3 - metFORMIN (GLUCOPHAGE) 1000 MG tablet; Take 1 tablet  (1,000 mg total) by mouth 2 (two) times daily with a meal.  Dispense: 60 tablet; Refill: 3 - pioglitazone (ACTOS) 45 MG tablet; Take 1 tablet (45 mg total) by mouth daily.  Dispense: 30 tablet; Refill: 3  2. Primary osteoarthritis of left knee Uncontrolled as she has been out of NSAIDs which have refilled Apply ice - meloxicam (MOBIC) 7.5 MG tablet; Take 1 tablet (7.5 mg total) by mouth daily.  Dispense: 30 tablet; Refill: 3  3. Muscle cramp Stable - cyclobenzaprine (FLEXERIL) 10 MG tablet; Take 1 tablet (10 mg total) by mouth at bedtime.  Dispense: 30 tablet; Refill: 1  4. Screening for breast cancer - MM Digital Screening; Future  5. Essential hypertension, benign Slightly elevated No regimen change today but encouraged to work on lifestyle modifications, low-sodium, DASH diet - amLODipine (NORVASC) 10 MG  tablet; Take 1 tablet (10 mg total) by mouth daily.  Dispense: 30 tablet; Refill: 3 - lisinopril-hydrochlorothiazide (PRINZIDE,ZESTORETIC) 20-25 MG tablet; Take 1 tablet by mouth daily.  Dispense: 30 tablet; Refill: 3  6. Pure hypercholesterolemia - atorvastatin (LIPITOR) 40 MG tablet; Take 1 tablet (40 mg total) by mouth daily.  Dispense: 30 tablet; Refill: 3  7. Hypothyroidism, unspecified type Controlled - levothyroxine (SYNTHROID, LEVOTHROID) 137 MCG tablet; Take 1 tablet (137 mcg total) by mouth daily.  Dispense: 30 tablet; Refill: 3   Meds ordered this encounter  Medications  . meloxicam (MOBIC) 7.5 MG tablet    Sig: Take 1 tablet (7.5 mg total) by mouth daily.    Dispense:  30 tablet    Refill:  3  . cyclobenzaprine (FLEXERIL) 10 MG tablet    Sig: Take 1 tablet (10 mg total) by mouth at bedtime.    Dispense:  30 tablet    Refill:  1  . empagliflozin (JARDIANCE) 10 MG TABS tablet    Sig: Take 10 mg by mouth daily.    Dispense:  30 tablet    Refill:  3  . amLODipine (NORVASC) 10 MG tablet    Sig: Take 1 tablet (10 mg total) by mouth daily.    Dispense:  30 tablet      Refill:  3  . atorvastatin (LIPITOR) 40 MG tablet    Sig: Take 1 tablet (40 mg total) by mouth daily.    Dispense:  30 tablet    Refill:  3  . gabapentin (NEURONTIN) 300 MG capsule    Sig: Take 1 capsule (300 mg total) by mouth at bedtime.    Dispense:  30 capsule    Refill:  3  . glipiZIDE (GLUCOTROL XL) 10 MG 24 hr tablet    Sig: Take 2 tablets (20 mg total) by mouth daily.    Dispense:  60 tablet    Refill:  3  . lisinopril-hydrochlorothiazide (PRINZIDE,ZESTORETIC) 20-25 MG tablet    Sig: Take 1 tablet by mouth daily.    Dispense:  30 tablet    Refill:  3  . metFORMIN (GLUCOPHAGE) 1000 MG tablet    Sig: Take 1 tablet (1,000 mg total) by mouth 2 (two) times daily with a meal.    Dispense:  60 tablet    Refill:  3  . pioglitazone (ACTOS) 45 MG tablet    Sig: Take 1 tablet (45 mg total) by mouth daily.    Dispense:  30 tablet    Refill:  3  . levothyroxine (SYNTHROID, LEVOTHROID) 137 MCG tablet    Sig: Take 1 tablet (137 mcg total) by mouth daily.    Dispense:  30 tablet    Refill:  3    Follow-up: Return in about 3 months (around 04/02/2018) for Follow-up of chronic medical conditions.   Charlott Rakes MD

## 2018-01-01 NOTE — Patient Instructions (Signed)
O diabetes mellitus e a nutrio, adultos Diabetes Mellitus and Nutrition, Adult Quando voc sofre de diabetes (diabetes mellitus),  muito importante ter hbitos de alimentao saudveis, uma vez que os seus nveis de acar no sangue (glicose) so significativamente afetados pelo que voc come e bebe. Comer alimentos saudveis nas quantidades apropriadas e aproximadamente nos mesmos horrios todos os dias pode ajudar voc a:  Controlar seu nvel de glicose sangunea.  Reduzir seu risco de doena cardaca.  Melhorar sua presso arterial.  Alcanar ou manter um peso saudvel. Todas as pessoas com diabetes so diferentes, e cada pessoa tem diferentes necessidades em termos de um plano de refeies. Seu mdico poder recomendar que voc colabore com um especialista em nutrio e dieta (nutricionista) para elaborar o melhor plano de refeies para voc. Seu plano de refeies poder variar dependendo de fatores como:  As calorias de que voc necessita.  Os medicamentos que toma.  Seu peso.  Seus nveis de glicose sangunea, presso arterial e colesterol.  Seu nvel de atividade.  Outros quadros clnicos, como doena cardaca ou renal. Como os carboidratos me afetam? Os carboidratos, tambm chamado de acares, afetam o nvel de sua glicose sangunea mais do que qualquer outro tipo de alimento. A ingesto de carboidratos aumenta naturalmente a quantidade de glicose no seu sangue. A contagem de carboidratos  um mtodo para acompanhar a quantidade de carboidratos que voc ingere. A contagem de carboidratos  importante para manter sua glicose sangunea em um nvel saudvel, principalmente se voc usa insulina ou toma certos medicamentos orais para o diabetes.  importante saber quanto de carboidrato voc pode ingerir com segurana em cada refeio. Isso varia de pessoa para pessoa. Seu nutricionista poder ajud-lo a calcular quanto de carboidrato voc deve consumir em cada refeio e em cada  lanche. Alimentos que contm carboidratos incluem:  Po, cereais, arroz, massas e bolachas.  Batatas e milho.  Ervilhas, feijo e lentilhas.  Leite e iogurte.  Frutas e sucos.  Sobremesas, como bolos, biscoitos, sorvete e doces. Como o lcool me afeta? O lcool pode causar uma sbita reduo do nvel de glicose sangunea (hipoglicemia), especialmente se voc usar insulina ou tomar certos medicamentos orais para o diabetes. A hipoglicemia pode ser um quadro clnico potencialmente fatal. Os sintomas de hipoglicemia (sonolncia, vertigem e confuso) so parecidos com os sintomas da ingesto abusiva de lcool. Caso seu mdico diga que o lcool  seguro para voc, siga essas orientaes:  Limite o consumo de bebidas alcolicas a, no mximo, 1 dose por dia para mulheres no grvidas e 2 doses por dia para homens. Uma dose equivale a 12 onas de cerveja, 5 onas de vinho ou 1 ona de bebida destilada.  No beba de estmago vazio.  Mantenha sua hidratao com gua, refrigerante diet ou ch gelado sem acar.  Tenha em mente que o refrigerante normal, suco e outras bebidas podem conter muito acar e devem ser contadas como carboidratos. Quais so as dicas para seguir este plano?  Leia os rtulos dos alimentos  Comece verificando o tamanho da poro nas "Informaes nutricionais" no rtulo de alimentos e bebidas embalados. A quantidade de calorias, carboidratos, gorduras e outros nutrientes listados no rtulo se baseia em uma poro padro do alimento. Muitos itens contm mais de uma poro por embalagem.  Verifique o total de gramas (g) de carboidratos contidos em uma poro. Voc pode calcular o nmero de pores de carboidratos em uma poro dividindo o total de carboidratos por 15. Por exemplo: supondo que um alimento contenha um   total de 30 g de carboidratos, isso seria igual a 2 pores de carboidratos.  Verifique o nmero de gramas (g) de gorduras saturadas e trans em uma poro.  Escolha alimentos com pouca ou nenhuma quantidade dessas gorduras.  Verifique a quantidade de miligramas (mg) de sal (sdio) em uma poro. A maioria das pessoas deve limitar o consumo de sdio a 2.300 mg por dia.  Sempre verifique as informaes nutricionais dos alimentos rotulados como de "baixo teor de gordura" e "gordura zero". Esses alimentos podem conter elevado teor de acar de adio ou carboidratos refinados e devem ser evitados.  Converse com seu nutricionista para identificar suas metas dirias dos nutrientes listados na tabela. No mercado  Evite comprar alimentos enlatados, semiprontos ou processados. Esses alimentos tendem a conter elevado teor de gordura, sdio e acares adicionados.  Escolha itens das reas mais externas da seo de alimentos. Ela inclui frutas e verduras frescas, cereais a granel, carnes frescas e produtos avirios frescos. Na cozinha  Use mtodos de cozimento de baixo calor, como assar, em vez de mtodos de cozimento de alto calor, como fritura por imerso.  Cozinhe com leos saudveis para o corao, como de oliva, canola ou girassol.  Evite cozinhar com manteiga, creme ou carnes com elevado teor de gordura. O planejamento das refeies  Consuma refeies e lanches regularmente, de preferncia nos mesmos horrios todos os dias. Evite ficar longos perodos sem comer.  Coma alimentos ricos em fibra, como frutas e verduras frescas, feijo e gros integrais. Converse com seu nutricionista sobre quantas pores de carboidratos voc pode comer em cada refeio.  Coma de 4-6 onas de protena magra por dia, como carne magra, frango, peixe, ovos ou tofu. Uma ona de protena magra  igual a: ? 1 ona de carne, frango ou peixe. ? 1 ovo. ?  xcara de tofu.  Coma alguns alimentos todos os dias que contenham gorduras saudveis, como abacate, nozes, sementes e peixe. Estilo de vida  Verifique sua glicose sangunea regularmente.  Exercite-se regularmente de  acordo com as indicaes do seu mdico. Isso pode incluir: ? 150 minutos de exerccios de intensidade moderada ou de intensidade vigorosa por semana. Pode ser uma caminhada leve, andar de bicicleta ou fazer hidroginstica. ? Alongar e realizar exerccios de fora, como ioga ou musculao, pelo menos 2 vezes por semana.  Tome medicamentos somente de acordo com as orientaes do seu mdico.  No use nenhum produto que contenha nicotina ou tabaco, como cigarros tradicionais e cigarros eletrnicos. Caso precise de ajuda para parar de fumar, fale com seu mdico.  Consulte-se com um especialista em diabetes para identificar estratgias para lidar com o estresse e quaisquer desafios emocionais ou sociais. Perguntas a fazer ao mdico  Preciso me consultar com um especialista em diabetes?  Preciso me consultar com um nutricionista?  Para que nmero devo ligar se tiver dvidas?  Quais so os melhores horrios para verificar minha glicose sangunea? Onde conseguir mais informaes:  Associao de Diabetes Americana (American Diabetes Association, ADA): diabetes.org  Academia de Nutrio e Diettica (Academy of Nutrition and Dietetics): www.eatright.org  National Institute of Diabetes and Digestive and Kidney Diseases (Instituto Nacional de Diabetes e Doenas Digestivas e Renais): www.niddk.nih.gov Resumo  Um plano de refeies saudveis ajudar voc a controlar sua glicose sangunea e a manter um estilo de vida saudvel.  Consultar um especialista em nutrio (nutricionista) poder ajudar na elaborao do melhor plano para voc.  Tenha em mente que carboidratos (acares) e lcool tm efeitos imediatos sobre seus nveis de glicose   sangunea.  importante contar os carboidratos e consumir lcool com cautela. Estas informaes no se destinam a substituir as recomendaes de seu mdico. No deixe de discutir quaisquer dvidas com seu mdico. Document Released: 04/24/2015 Document Revised:  09/10/2016 Document Reviewed: 05/02/2016 Elsevier Interactive Patient Education  2019 [employer].

## 2018-01-02 ENCOUNTER — Encounter: Payer: Self-pay | Admitting: Family Medicine

## 2018-01-13 MED FILL — PIOGLITAZONE HCL 45 MG TABS: 45 | 30 days supply | Qty: 30 | Fill #0

## 2018-02-25 MED FILL — GABAPENTIN 300 MG CAPSULE: 300 | 30 days supply | Qty: 30 | Fill #1

## 2018-02-25 MED FILL — ?ATORVASTATIN 40MG TABLET: 40 | 30 days supply | Qty: 30 | Fill #1

## 2018-02-25 MED FILL — CYCLOBENZAPRINE 10 MG TAB: 10 | 30 days supply | Qty: 30 | Fill #1

## 2018-02-25 MED FILL — ?METFORMIN HCL 1000 MG TAB: 1000 | 30 days supply | Qty: 60 | Fill #1

## 2018-02-25 MED FILL — glipiZIDE XL 10 MG TB24: 10 | 30 days supply | Qty: 60 | Fill #1

## 2018-02-25 MED FILL — LEVOTHYROXINE 137 MCG TAB: 137 | 30 days supply | Qty: 30 | Fill #1

## 2018-02-25 MED FILL — MELOXICAM 7.5 MG TABLET: 7.5 | 30 days supply | Qty: 30 | Fill #1

## 2018-02-25 MED FILL — JARDIANCE 10 MG TABLET: 10 | 30 days supply | Qty: 30 | Fill #1

## 2018-02-25 MED FILL — LISINOPRIL-HCTZ 20-25 MG TA: 20-25 | 30 days supply | Qty: 30 | Fill #1

## 2018-02-25 MED FILL — AMLODIPINE BESYLATE 10 MG T: 10 | 30 days supply | Qty: 30 | Fill #1

## 2018-04-02 MED FILL — CYCLOBENZAPRINE 10 MG TAB: 10 | 30 days supply | Qty: 30 | Fill #1

## 2018-04-02 MED FILL — AMLODIPINE BESYLATE 10 MG T: 10 | 30 days supply | Qty: 30 | Fill #2

## 2018-04-02 MED FILL — ?ATORVASTATIN 40MG TABLET: 40 | 30 days supply | Qty: 30 | Fill #2

## 2018-04-02 MED FILL — LISINOPRIL-HCTZ 20-25 MG TA: 20-25 | 30 days supply | Qty: 30 | Fill #2

## 2018-04-02 MED FILL — GABAPENTIN 300 MG CAPSULE: 300 | 30 days supply | Qty: 30 | Fill #2

## 2018-04-02 MED FILL — glipiZIDE XL 10 MG TB24: 10 | 30 days supply | Qty: 60 | Fill #2

## 2018-04-02 MED FILL — MELOXICAM 7.5 MG TABLET: 7.5 | 30 days supply | Qty: 30 | Fill #2

## 2018-04-02 MED FILL — ?METFORMIN HCL 1000 MG TAB: 1000 | 30 days supply | Qty: 60 | Fill #2

## 2018-04-07 MED FILL — LEVOTHYROXINE 137 MCG TAB: 137 | 30 days supply | Qty: 30 | Fill #2

## 2018-04-07 MED FILL — JARDIANCE 10 MG TABLET: 10 | 30 days supply | Qty: 30 | Fill #2

## 2018-04-08 ENCOUNTER — Ambulatory Visit: Payer: Self-pay | Admitting: Family Medicine

## 2018-05-05 ENCOUNTER — Other Ambulatory Visit: Payer: Self-pay | Admitting: Family Medicine

## 2018-05-05 DIAGNOSIS — R252 Cramp and spasm: Secondary | ICD-10-CM

## 2018-05-05 MED FILL — ?METFORMIN HCL 1000 MG TAB: 1000 | 30 days supply | Qty: 60 | Fill #3

## 2018-05-05 MED FILL — LISINOPRIL-HCTZ 20-25 MG TA: 20-25 | 30 days supply | Qty: 30 | Fill #3

## 2018-05-05 MED FILL — CYCLOBENZAPRINE 10 MG TAB: 10 | 30 days supply | Qty: 30 | Fill #0

## 2018-05-05 MED FILL — MELOXICAM 7.5 MG TABLET: 7.5 | 30 days supply | Qty: 30 | Fill #3

## 2018-05-05 MED FILL — LEVOTHYROXINE 137 MCG TAB: 137 | 30 days supply | Qty: 30 | Fill #3

## 2018-05-05 MED FILL — AMLODIPINE BESYLATE 10 MG T: 10 | 30 days supply | Qty: 30 | Fill #3

## 2018-05-05 MED FILL — glipiZIDE XL 10 MG TB24: 10 | 30 days supply | Qty: 60 | Fill #3

## 2018-05-05 MED FILL — ?ATORVASTATIN 40MG TABLET: 40 | 30 days supply | Qty: 30 | Fill #3

## 2018-06-04 ENCOUNTER — Other Ambulatory Visit: Payer: Self-pay | Admitting: Family Medicine

## 2018-06-04 DIAGNOSIS — I10 Essential (primary) hypertension: Secondary | ICD-10-CM

## 2018-06-04 DIAGNOSIS — E039 Hypothyroidism, unspecified: Secondary | ICD-10-CM

## 2018-06-04 DIAGNOSIS — E1149 Type 2 diabetes mellitus with other diabetic neurological complication: Secondary | ICD-10-CM

## 2018-06-04 DIAGNOSIS — E7800 Pure hypercholesterolemia, unspecified: Secondary | ICD-10-CM

## 2018-06-04 DIAGNOSIS — M1712 Unilateral primary osteoarthritis, left knee: Secondary | ICD-10-CM

## 2018-06-04 MED FILL — ?ATORVASTATIN 40MG TABLET: 40 | 30 days supply | Qty: 30 | Fill #0

## 2018-06-04 MED FILL — MELOXICAM 7.5 MG TABLET: 7.5 | 30 days supply | Qty: 30 | Fill #0

## 2018-06-04 MED FILL — ?METFORMIN HCL 1000 MG TAB: 1000 | 30 days supply | Qty: 60 | Fill #0

## 2018-06-04 MED FILL — ?LEVOTHYROXINE 137 MCG TAB: 137 | 30 days supply | Qty: 30 | Fill #0

## 2018-06-04 MED FILL — glipiZIDE XL 10 MG TB24: 10 | 30 days supply | Qty: 60 | Fill #0

## 2018-06-04 MED FILL — CYCLOBENZAPRINE 10 MG TAB: 10 | 30 days supply | Qty: 30 | Fill #1

## 2018-06-04 MED FILL — LISINOPRIL-HCTZ 20-25 MG TA: 20-25 | 30 days supply | Qty: 30 | Fill #0

## 2018-06-04 MED FILL — ?AMLODIPINE BESYLATE 10 MG: 10 | 30 days supply | Qty: 30 | Fill #0

## 2018-06-26 ENCOUNTER — Other Ambulatory Visit: Payer: Self-pay | Admitting: Family Medicine

## 2018-06-26 DIAGNOSIS — R252 Cramp and spasm: Secondary | ICD-10-CM

## 2018-06-26 MED FILL — glipiZIDE XL 10 MG TB24: 10 | 30 days supply | Qty: 60 | Fill #0

## 2018-06-26 MED FILL — CYCLOBENZAPRINE 10 MG TAB: 10 | 30 days supply | Qty: 30 | Fill #0

## 2018-06-26 MED FILL — ?AMLODIPINE BESYLATE 10 MG: 10 | 30 days supply | Qty: 30 | Fill #0

## 2018-06-26 MED FILL — metFORMIN HCL 1000 MG TABS: 1000 | 30 days supply | Qty: 60 | Fill #0

## 2018-06-26 MED FILL — ?ATORVASTATIN 40MG TABLET: 40 | 30 days supply | Qty: 30 | Fill #0

## 2018-06-26 MED FILL — LISINOPRIL-HCTZ 20-25 MG TA: 20-25 | 30 days supply | Qty: 30 | Fill #0

## 2018-06-26 MED FILL — MELOXICAM 7.5 MG TABLET: 7.5 | 30 days supply | Qty: 30 | Fill #2

## 2018-06-26 MED FILL — ?LEVOTHYROXINE 137 MCG TAB: 137 | 30 days supply | Qty: 30 | Fill #0

## 2018-07-09 ENCOUNTER — Other Ambulatory Visit: Payer: Self-pay

## 2018-07-09 ENCOUNTER — Encounter: Payer: Self-pay | Admitting: Family Medicine

## 2018-07-09 ENCOUNTER — Ambulatory Visit: Payer: Self-pay | Attending: Family Medicine | Admitting: Family Medicine

## 2018-07-09 DIAGNOSIS — E039 Hypothyroidism, unspecified: Secondary | ICD-10-CM

## 2018-07-09 DIAGNOSIS — E7800 Pure hypercholesterolemia, unspecified: Secondary | ICD-10-CM

## 2018-07-09 DIAGNOSIS — E1149 Type 2 diabetes mellitus with other diabetic neurological complication: Secondary | ICD-10-CM

## 2018-07-09 DIAGNOSIS — I10 Essential (primary) hypertension: Secondary | ICD-10-CM

## 2018-07-09 MED ORDER — GLIPIZIDE ER 10 MG PO TB24: ORAL_TABLET | ORAL | 5 refills | Status: DC

## 2018-07-09 MED ORDER — METFORMIN HCL 1000 MG PO TABS: 1000.0000 mg | ORAL_TABLET | Freq: Two times a day (BID) | ORAL | 6 refills | Status: DC

## 2018-07-09 MED ORDER — GABAPENTIN 300 MG PO CAPS: 300.0000 mg | ORAL_CAPSULE | Freq: Every day | ORAL | 6 refills | Status: DC

## 2018-07-09 MED ORDER — LISINOPRIL-HYDROCHLOROTHIAZIDE 20-25 MG PO TABS: 1.0000 | ORAL_TABLET | Freq: Every day | ORAL | 6 refills | Status: DC

## 2018-07-09 MED ORDER — JARDIANCE 10 MG PO TABS: 10.0000 mg | ORAL_TABLET | Freq: Every day | ORAL | 6 refills | Status: DC

## 2018-07-09 MED ORDER — AMLODIPINE BESYLATE 10 MG PO TABS: 10.0000 mg | ORAL_TABLET | Freq: Every day | ORAL | 0 refills | Status: DC

## 2018-07-09 MED ORDER — ATORVASTATIN CALCIUM 40 MG PO TABS: 40.0000 mg | ORAL_TABLET | Freq: Every day | ORAL | 6 refills | Status: DC

## 2018-07-09 MED ORDER — PIOGLITAZONE HCL 45 MG PO TABS: 45.0000 mg | ORAL_TABLET | Freq: Every day | ORAL | 6 refills | Status: DC

## 2018-07-09 MED FILL — PIOGLITAZONE HCL 45 MG TABS: 45 | 30 days supply | Qty: 30 | Fill #0

## 2018-07-09 MED FILL — JARDIANCE 10 MG TABLET: 10 | 30 days supply | Qty: 30 | Fill #0

## 2018-07-09 MED FILL — GABAPENTIN 300 MG CAPSULE: 300 | 30 days supply | Qty: 30 | Fill #0

## 2018-07-09 NOTE — Progress Notes (Signed)
Virtual Visit via Telephone Note  I connected with Linda House, on 07/09/2018 at 8:51 AM by telephone due to the COVID-19 pandemic and verified that I am speaking with the correct person using two identifiers.   Consent: I discussed the limitations, risks, security and privacy concerns of performing an evaluation and management service by telephone and the availability of in person appointments. I also discussed with the patient that there may be a patient responsible charge related to this service. The patient expressed understanding and agreed to proceed.   Location of Patient: Home  Location of Provider: Clinic   Persons participating in Telemedicine visit: Artia Halayna Blane Farrington-CMA Dr. Gerome Apley ID [phone]    History of Present Illness: Linda House is a 73 year old female with a history of uncontrolled type 2 diabetes mellitus (A1c 8.5), hypothyroidism, hyperlipidemia, hypertension who is seen for follow-up visit today. She endorses compliance with her medications and her fasting sugars have been in the 120-125 range.  She denies hypoglycemia, numbness in extremities or visual concerns. She does not have a means of checking her blood pressure but has been compliant with her antihypertensive and her blood pressure at her last office visit was 146/78. She is doing well on her statin and denies adverse effects from her medications. Also compliant with levothyroxine which she takes for hypothyroidism.  She has no additional concerns at this time and denies chest pain, dyspnea, pedal edema.   Past Medical History:  Diagnosis Date  . Diabetes mellitus   . Diabetes mellitus without complication ([place])   . Hypertension    No Known Allergies  Current Outpatient Medications on File Prior to Visit  Medication Sig Dispense Refill  . amLODipine (NORVASC) 10 MG tablet TAKE 1 TABLET (10 MG TOTAL) BY MOUTH DAILY. (Patient taking  differently: Take by mouth daily. ) 30 tablet 0  . aspirin EC 81 MG tablet Take 1 tablet (81 mg total) by mouth daily. 30 tablet 3  . atorvastatin (LIPITOR) 40 MG tablet TAKE 1 TABLET (40 MG TOTAL) BY MOUTH DAILY. 30 tablet 0  . cetirizine (ZYRTEC) 10 MG tablet Take 1 tablet (10 mg total) by mouth daily. 30 tablet 1  . cyclobenzaprine (FLEXERIL) 10 MG tablet TAKE 1 TABLET BY MOUTH AT BEDTIME. 30 tablet 0  . empagliflozin (JARDIANCE) 10 MG TABS tablet Take 10 mg by mouth daily. 30 tablet 3  . gabapentin (NEURONTIN) 300 MG capsule Take 1 capsule (300 mg total) by mouth at bedtime. 30 capsule 3  . GLIPIZIDE XL 10 MG 24 hr tablet TAKE 2 TABLETS (20 MG TOTAL) BY MOUTH DAILY. 60 tablet 0  . glucose blood test strip Use as instructed 100 each 12  . glucose monitoring kit (FREESTYLE) monitoring kit 1 each by Does not apply route as needed. 1 each 0  . Lancets (FREESTYLE) lancets Use as instructed 100 each 12  . levothyroxine (SYNTHROID) 137 MCG tablet TAKE 1 TABLET (137 MCG TOTAL) BY MOUTH DAILY. 30 tablet 0  . lisinopril-hydrochlorothiazide (ZESTORETIC) 20-25 MG tablet TAKE 1 TABLET BY MOUTH DAILY. 30 tablet 0  . meloxicam (MOBIC) 7.5 MG tablet TAKE 1 TABLET (7.5 MG TOTAL) BY MOUTH DAILY. 30 tablet 0  . metFORMIN (GLUCOPHAGE) 1000 MG tablet TAKE 1 TABLET (1,000 MG TOTAL) BY MOUTH 2 (TWO) TIMES DAILY WITH A MEAL. 60 tablet 0  . olopatadine (PATANOL) 0.1 % ophthalmic solution Place 1 drop into both eyes 2 (two) times daily. 5 mL 1  . pioglitazone (ACTOS) 45  MG tablet Take 1 tablet (45 mg total) by mouth daily. 30 tablet 3  . amoxicillin (AMOXIL) 500 MG capsule Take 1 capsule (500 mg total) by mouth 3 (three) times daily. (Patient not taking: Reported on 08/27/2017) 30 capsule 0  . [DISCONTINUED] Calcium 500-125 MG-UNIT TABS Take 1 tablet by mouth daily. (Patient not taking: Reported on 12/31/2013) 90 each 3   No current facility-administered medications on file prior to visit.      Observations/Objective: Alert, awake, oriented x3 Not in acute distress  CMP Latest Ref Rng & Units 08/27/2017 02/28/2017 08/27/2016  Glucose 65 - 99 mg/dL 93 93 136(H)  BUN 8 - 27 mg/dL '14 10 10  ' Creatinine 0.57 - 1.00 mg/dL 0.76 0.60 0.61  Sodium 134 - 144 mmol/L 140 140 140  Potassium 3.5 - 5.2 mmol/L 4.4 4.4 4.5  Chloride 96 - 106 mmol/L 101 102 103  CO2 20 - 29 mmol/L '26 23 24  ' Calcium 8.7 - 10.3 mg/dL 9.4 9.3 8.7  Total Protein 6.0 - 8.5 g/dL 7.4 7.3 7.4  Total Bilirubin 0.0 - 1.2 mg/dL 0.3 0.2 0.2  Alkaline Phos 39 - 117 IU/L 155(H) 136(H) 161(H)  AST 0 - 40 IU/L '11 14 13  ' ALT 0 - 32 IU/L '12 14 10    ' Lipid Panel     Component Value Date/Time   CHOL 198 08/27/2017 0913   TRIG 131 08/27/2017 0913   HDL 54 08/27/2017 0913   CHOLHDL 3.7 08/27/2017 0913   CHOLHDL 3.4 02/19/2016 1021   VLDL 25 08/29/2015 0849   LDLCALC 118 (H) 08/27/2017 0913    Lab Results  Component Value Date   HGBA1C 8.5 (A) 01/01/2018    Assessment and Plan: 1. Type 2 diabetes mellitus with other neurologic complication, without long-term current use of insulin (HCC) Uncontrolled with A1c of 8.5 A1c today and I will adjust her regimen accordingly Counseled on Diabetic diet, my plate method, 742 minutes of moderate intensity exercise/week Keep blood sugar logs with fasting goals of 80-120 mg/dl, random of less than 180 and in the event of sugars less than 60 mg/dl or greater than 400 mg/dl please notify the clinic ASAP. It is recommended that you undergo annual eye exams and annual foot exams. Pneumonia vaccine is recommended. - CMP14+EGFR - Hemoglobin A1c - Microalbumin / creatinine urine ratio - Lipid panel - glipiZIDE (GLIPIZIDE XL) 10 MG 24 hr tablet; TAKE 2 TABLETS (20 MG TOTAL) BY MOUTH DAILY.  Dispense: 60 tablet; Refill: 5 - metFORMIN (GLUCOPHAGE) 1000 MG tablet; Take 1 tablet (1,000 mg total) by mouth 2 (two) times daily with a meal.  Dispense: 60 tablet; Refill: 6 - pioglitazone  (ACTOS) 45 MG tablet; Take 1 tablet (45 mg total) by mouth daily.  Dispense: 30 tablet; Refill: 6 - gabapentin (NEURONTIN) 300 MG capsule; Take 1 capsule (300 mg total) by mouth at bedtime.  Dispense: 30 capsule; Refill: 6  2. Essential hypertension, benign Stable Advised to check blood pressures at home-could be accessed at pharmacies Counseled on blood pressure goal of less than 130/80, low-sodium, DASH diet, medication compliance, 150 minutes of moderate intensity exercise per week. Discussed medication compliance, adverse effects. - lisinopril-hydrochlorothiazide (ZESTORETIC) 20-25 MG tablet; Take 1 tablet by mouth daily.  Dispense: 30 tablet; Refill: 6 - amLODipine (NORVASC) 10 MG tablet; Take 1 tablet (10 mg total) by mouth daily.  Dispense: 30 tablet; Refill: 0  3. Pure hypercholesterolemia LDL of 114 which is above goal of less than 100 We will send  of lipid panel today - atorvastatin (LIPITOR) 40 MG tablet; Take 1 tablet (40 mg total) by mouth daily.  Dispense: 30 tablet; Refill: 6  4. Hypothyroidism, unspecified type Controlled - T4, free - TSH   Follow Up Instructions: 3 months   I discussed the assessment and treatment plan with the patient. The patient was provided an opportunity to ask questions and all were answered. The patient agreed with the plan and demonstrated an understanding of the instructions.   The patient was advised to call back or seek an in-person evaluation if the symptoms worsen or if the condition fails to improve as anticipated.     I provided 16 minutes total of non-face-to-face time during this encounter including median intraservice time, reviewing previous notes, labs, imaging, medications, management and patient verbalized understanding.     Charlott Rakes, MD, FAAFP. [facility], [place]   07/09/2018, 8:51 AM

## 2018-07-09 NOTE — Progress Notes (Signed)
Patient has been called and DOB has been verified. Patient has been screened and transferred to PCP to start phone visit.     

## 2018-07-10 ENCOUNTER — Other Ambulatory Visit: Payer: Self-pay

## 2018-08-13 ENCOUNTER — Other Ambulatory Visit: Payer: Self-pay | Admitting: Family Medicine

## 2018-08-13 DIAGNOSIS — R252 Cramp and spasm: Secondary | ICD-10-CM

## 2018-08-13 MED FILL — MELOXICAM 7.5 MG TABLET: 7.5 | 30 days supply | Qty: 30 | Fill #3

## 2018-08-13 MED FILL — ?ATORVASTATIN 40MG TABLET: 40 | 30 days supply | Qty: 30 | Fill #1

## 2018-08-13 MED FILL — ?LEVOTHYROXINE 137 MCG TAB: 137 | 30 days supply | Qty: 30 | Fill #1

## 2018-08-13 MED FILL — ?AMLODIPINE BESYLATE 10 MG: 10 | 30 days supply | Qty: 30 | Fill #1

## 2018-08-13 MED FILL — metFORMIN HCL 1000 MG TABS: 1000 | 30 days supply | Qty: 60 | Fill #0

## 2018-08-13 MED FILL — LISINOPRIL-HCTZ 20-25 MG TA: 20-25 | 30 days supply | Qty: 30 | Fill #1

## 2018-08-13 MED FILL — JARDIANCE 10 MG TABLET: 10 | 30 days supply | Qty: 30 | Fill #0

## 2018-08-17 MED FILL — CYCLOBENZAPRINE 10 MG TAB: 10 | 30 days supply | Qty: 30 | Fill #0

## 2018-09-22 ENCOUNTER — Other Ambulatory Visit: Payer: Self-pay | Admitting: Family Medicine

## 2018-09-22 DIAGNOSIS — E039 Hypothyroidism, unspecified: Secondary | ICD-10-CM

## 2018-09-22 DIAGNOSIS — I10 Essential (primary) hypertension: Secondary | ICD-10-CM

## 2018-09-22 DIAGNOSIS — R252 Cramp and spasm: Secondary | ICD-10-CM

## 2018-09-22 DIAGNOSIS — E7800 Pure hypercholesterolemia, unspecified: Secondary | ICD-10-CM

## 2018-09-22 DIAGNOSIS — M1712 Unilateral primary osteoarthritis, left knee: Secondary | ICD-10-CM

## 2018-09-22 MED FILL — metFORMIN HCL 1000 MG TABS: 1000 | 30 days supply | Qty: 60 | Fill #1

## 2018-09-22 MED FILL — LISINOPRIL-HCTZ 20-25 MG TA: 20-25 | 30 days supply | Qty: 30 | Fill #0

## 2018-09-22 MED FILL — ?AMLODIPINE BESYLATE 10 MG: 10 | 30 days supply | Qty: 30 | Fill #0

## 2018-09-22 MED FILL — ?ATORVASTATIN 40MG TABLET: 40 | 30 days supply | Qty: 30 | Fill #0

## 2018-09-23 MED FILL — ?LEVOTHYROXINE 137 MCG TAB: 137 | 30 days supply | Qty: 30 | Fill #0

## 2018-09-23 MED FILL — MELOXICAM 7.5 MG TABLET: 7.5 | 30 days supply | Qty: 30 | Fill #0

## 2018-09-23 MED FILL — CYCLOBENZAPRINE 10 MG TAB: 10 | 30 days supply | Qty: 30 | Fill #0

## 2018-10-13 ENCOUNTER — Encounter: Payer: Self-pay | Admitting: Family Medicine

## 2018-10-13 ENCOUNTER — Ambulatory Visit: Payer: Self-pay | Attending: Family Medicine | Admitting: Family Medicine

## 2018-10-13 ENCOUNTER — Other Ambulatory Visit: Payer: Self-pay

## 2018-10-13 VITALS — BP 173/77 | HR 84 | Temp 98.2°F | Ht 59.5 in | Wt 156.0 lb

## 2018-10-13 DIAGNOSIS — B373 Candidiasis of vulva and vagina: Secondary | ICD-10-CM

## 2018-10-13 DIAGNOSIS — B3731 Acute candidiasis of vulva and vagina: Secondary | ICD-10-CM

## 2018-10-13 DIAGNOSIS — I10 Essential (primary) hypertension: Secondary | ICD-10-CM

## 2018-10-13 DIAGNOSIS — E039 Hypothyroidism, unspecified: Secondary | ICD-10-CM

## 2018-10-13 DIAGNOSIS — E1165 Type 2 diabetes mellitus with hyperglycemia: Secondary | ICD-10-CM

## 2018-10-13 LAB — GLUCOSE, POCT (MANUAL RESULT ENTRY): POC Glucose: 165 mg/dl — AB (ref 70–99)

## 2018-10-13 LAB — POCT GLYCOSYLATED HEMOGLOBIN (HGB A1C): HbA1c, POC (controlled diabetic range): 9.8 % — AB (ref 0.0–7.0)

## 2018-10-13 MED ORDER — FLUCONAZOLE 150 MG PO TABS: 150.0000 mg | ORAL_TABLET | Freq: Once | ORAL | 0 refills | Status: AC

## 2018-10-13 NOTE — Patient Instructions (Signed)
Diabetes mellitus y nutricin, en adultos Diabetes Mellitus and Nutrition, Adult Si sufre de diabetes (diabetes mellitus), es muy importante tener hbitos alimenticios saludables debido a que sus niveles de Designer, television/film set sangre (glucosa) se ven afectados en gran medida por lo que come y bebe. Comer alimentos saludables en las cantidades [place], aproximadamente a la [employer], [place] ayudar a:  Aeronautical engineer glucemia.  Disminuir el riesgo de sufrir una enfermedad cardaca.  Mejorar la presin arterial.  Science writer o mantener un peso saludable. Todas las personas que sufren de diabetes son diferentes y cada una tiene necesidades diferentes en cuanto a un plan de alimentacin. El mdico puede recomendarle que trabaje con un especialista en dietas y nutricin (nutricionista) para Financial trader plan para usted. Su plan de alimentacin puede variar segn factores como:  Las caloras que necesita.  Los medicamentos que toma.  Su peso.  Sus niveles de glucemia, presin arterial y colesterol.  Su nivel de [place].  Otras afecciones que tenga, como enfermedades cardacas o renales. Cmo me afectan los carbohidratos? Los carbohidratos, o hidratos de carbono, afectan su nivel de glucemia ms que cualquier otro tipo de alimento. La ingesta de carbohidratos naturalmente aumenta la cantidad de [employer]. El recuento de carbohidratos es un mtodo destinado a Catering manager un registro de la cantidad de carbohidratos que se consumen. El recuento de carbohidratos es importante para Theatre manager la glucemia a un nivel saludable, especialmente si utiliza insulina o toma determinados medicamentos por va oral para la diabetes. Es importante conocer la cantidad de carbohidratos que se pueden ingerir en cada comida sin correr Engineer, manufacturing. Esto es Psychologist, forensic. Su nutricionista puede ayudarlo a calcular la cantidad de carbohidratos que debe ingerir en cada comida y en cada  refrigerio. Entre los alimentos que contienen carbohidratos, se incluyen:  Pan, cereal, arroz, pastas y galletas.  Papas y maz.  Guisantes, frijoles y lentejas.  Leche y Estate agent.  Lambert Mody y [place].  Postres, como pasteles, galletas, helado y caramelos. Cmo me afecta el alcohol? El alcohol puede provocar disminuciones sbitas de la glucemia (hipoglucemia), especialmente si utiliza insulina o toma determinados medicamentos por va oral para la diabetes. La hipoglucemia es una afeccin potencialmente mortal. Los sntomas de la hipoglucemia (somnolencia, mareos y confusin) son similares a los sntomas de haber consumido demasiado alcohol. Si el mdico afirma que el alcohol es seguro para usted, [place] estas pautas:  Limite el consumo de alcohol a no ms de 26mdida por da si es mujer y no est [place] y a 219midas si es hombre. Una medida equivale a 12oz (35564mde cerveza, 5oz (148m60me vino o 1oz (44ml75m bebidas alcohlicas de alta graduacin.  No beba con el estmago vaco.  Mantngase hidratado bebiendo agua, refrescos dietticos o t helado sin azcar.  Tenga en cuenta que los refrescos comunes, los jugos y otras bebida para mezclOptician, dispensingen contener mucha azcar y se deben contar como carbohidratos. Cules son algunos consejos para seguir este plan?  Leer las etiquetas de los alimentos  Comience por leer el tamao de la porcin en la "Informacin nutricional" en las etiquetas de los alimentos envasados y las bebidas. La cantidad de caloras, carbohidratos, grasas y otros nutrientes mencionados en la etiqueta se basan en una porcin del alimento. Muchos alimentos contienen ms de una porcin por envase.  Verifique la cantidad total de gramos (g) de carbohidratos totales en una porcin. Puede calcular la cantidad de porciones de carbohidratos al dividir el  Muchos alimentos contienen ms de una porcin por envase.   Verifique la cantidad total de gramos (g) de carbohidratos totales en una porcin. Puede calcular la cantidad de porciones de carbohidratos al dividir el total de carbohidratos por 15. Por ejemplo, si un alimento tiene un total de 30g de carbohidratos, equivale a 2  porciones de carbohidratos.   Verifique la cantidad de gramos (g) de grasas saturadas y grasas trans en una porcin. Escoja alimentos que no contengan grasa o que tengan un bajo contenido.   Verifique la cantidad de miligramos (mg) de sal (sodio) en una porcin. La mayora de las personas deben limitar la ingesta de sodio total a menos de 2300mg por da.   Siempre consulte la informacin nutricional de los alimentos etiquetados como "con bajo contenido de grasa" o "sin grasa". Estos alimentos pueden tener un mayor contenido de azcar agregada o carbohidratos refinados, y deben evitarse.   Hable con su nutricionista para identificar sus objetivos diarios en cuanto a los nutrientes mencionados en la etiqueta.  Al ir de compras   Evite comprar alimentos procesados, enlatados o precocinados. Estos alimentos tienden a tener una mayor cantidad de grasa, sodio y azcar agregada.   Compre en la zona exterior de la tienda de comestibles. Esta zona incluye frutas y verduras frescas, granos a granel, carnes frescas y productos lcteos frescos.  Al cocinar   Utilice mtodos de coccin a baja temperatura, como hornear, en lugar de mtodos de coccin a alta temperatura, como frer en abundante aceite.   Cocine con aceites saludables, como el aceite de oliva, canola o girasol.   Evite cocinar con manteca, crema o carnes con alto contenido de grasa.  Planificacin de las comidas   Coma las comidas y los refrigerios regularmente, preferentemente a la misma hora todos los das. Evite pasar largos perodos de tiempo sin comer.   Consuma alimentos ricos en fibra, como frutas frescas, verduras, frijoles y cereales integrales. Consulte a su nutricionista sobre cuntas porciones de carbohidratos puede consumir en cada comida.   Consuma entre 4 y 6 onzas (oz) de protenas magras por da, como carnes magras, pollo, pescado, huevos o tofu. Una onza de protena magra equivale a:  ? 1 onza de carne, pollo o  pescado.  ? 1huevo.  ?  taza de tofu.   Coma algunos alimentos por da que contengan grasas saludables, como aguacates, frutos secos, semillas y pescado.  Estilo de vida   Controle su nivel de glucemia con regularidad.   Haga actividad fsica habitualmente como se lo haya indicado el mdico. Esto puede incluir lo siguiente:  ? 150minutos semanales de ejercicio de intensidad moderada o alta. Esto podra incluir caminatas dinmicas, ciclismo o gimnasia acutica.  ? Realizar ejercicios de elongacin y de fortalecimiento, como yoga o levantamiento de pesas, por lo menos 2veces por semana.   Tome los medicamentos como se lo haya indicado el mdico.   No consuma ningn producto que contenga nicotina o tabaco, como cigarrillos y cigarrillos electrnicos. Si necesita ayuda para dejar de fumar, consulte al mdico.   Trabaje con un asesor o instructor en diabetes para identificar estrategias para controlar el estrs y cualquier desafo emocional y social.  Preguntas para hacerle al mdico   Es necesario que consulte a un instructor en el cuidado de la diabetes?   Es necesario que me rena con un nutricionista?   A qu nmero puedo llamar si tengo preguntas?   Cules son los mejores momentos para controlar la glucemia?  Dnde   Trabajar con un especialista en dietas y nutricin (nutricionista) puede ayudarlo a elaborar el mejor plan de alimentacin para usted.  Tenga en cuenta que los carbohidratos (hidratos de carbono) y el alcohol tienen  efectos inmediatos en sus niveles de glucemia. Es importante contar los carbohidratos que ingiere y consumir alcohol con prudencia. Esta informacin no tiene como fin reemplazar el consejo del mdico. Asegrese de hacerle al mdico cualquier pregunta que tenga. Document Released: 04/09/2007 Document Revised: 09/10/2016 Document Reviewed: 04/22/2016 Elsevier Patient Education  2020 Elsevier Inc.  

## 2018-10-13 NOTE — Progress Notes (Signed)
Subjective:  Patient ID: Linda House, female    DOB: [birth year]  Age: 73 y.o. MRN: [id number]  CC: Diabetes   HPI Linda House is a 73 year old female with a history of uncontrolled type 2 diabetes mellitus (A1c 8.5), hypothyroidism, hyperlipidemia, hypertension who is seen for follow-up visit today. Her A1c is 9.8 which is up from 8.5 previously and she states she has been out of her medications for the last 3 weeks.  We had discussed initiating insulin which she has not been open to and is not open to commencing any other injectable.  Has not been compliant with checking her blood sugars because she does not like to poke her finger she promises to work better on her lifestyle. Her blood pressure is elevated and she is yet to take her antihypertensive today as she just had a very small breakfast in anticipation of fasting labs. Compliant with her statin and levothyroxine for hypothyroidism.   She complains of vaginal itching after she completed a course of antibiotics from her dentist and would like something for this. She also has a form for clearance for dental extraction by her dentist.  Past Medical History:  Diagnosis Date  . Diabetes mellitus   . Diabetes mellitus without complication ([place])   . Hypertension     History reviewed. No pertinent surgical history.  Family History  Problem Relation Age of Onset  . Diabetes Father   . Hypertension Father     No Known Allergies  Outpatient Medications Prior to Visit  Medication Sig Dispense Refill  . amLODipine (NORVASC) 10 MG tablet TAKE 1 TABLET BY MOUTH DAILY. 30 tablet 2  . atorvastatin (LIPITOR) 40 MG tablet TAKE 1 TABLET BY MOUTH DAILY. 30 tablet 3  . cetirizine (ZYRTEC) 10 MG tablet Take 1 tablet (10 mg total) by mouth daily. 30 tablet 1  . cyclobenzaprine (FLEXERIL) 10 MG tablet TAKE 1 TABLET BY MOUTH AT BEDTIME. 30 tablet 0  . empagliflozin (JARDIANCE) 10 MG TABS tablet Take 10 mg by mouth  daily. 30 tablet 6  . gabapentin (NEURONTIN) 300 MG capsule Take 1 capsule (300 mg total) by mouth at bedtime. 30 capsule 6  . glipiZIDE (GLIPIZIDE XL) 10 MG 24 hr tablet TAKE 2 TABLETS (20 MG TOTAL) BY MOUTH DAILY. 60 tablet 5  . glucose blood test strip Use as instructed 100 each 12  . glucose monitoring kit (FREESTYLE) monitoring kit 1 each by Does not apply route as needed. 1 each 0  . Lancets (FREESTYLE) lancets Use as instructed 100 each 12  . levothyroxine (SYNTHROID) 137 MCG tablet TAKE 1 TABLET (137 MCG TOTAL) BY MOUTH DAILY. 30 tablet 3  . lisinopril-hydrochlorothiazide (ZESTORETIC) 20-25 MG tablet TAKE 1 TABLET BY MOUTH DAILY. 30 tablet 2  . meloxicam (MOBIC) 7.5 MG tablet TAKE 1 TABLET BY MOUTH DAILY. 30 tablet 2  . metFORMIN (GLUCOPHAGE) 1000 MG tablet Take 1 tablet (1,000 mg total) by mouth 2 (two) times daily with a meal. 60 tablet 6  . olopatadine (PATANOL) 0.1 % ophthalmic solution Place 1 drop into both eyes 2 (two) times daily. 5 mL 1  . pioglitazone (ACTOS) 45 MG tablet Take 1 tablet (45 mg total) by mouth daily. 30 tablet 6  . amoxicillin (AMOXIL) 500 MG capsule Take 1 capsule (500 mg total) by mouth 3 (three) times daily. (Patient not taking: Reported on 08/27/2017) 30 capsule 0  . aspirin EC 81 MG tablet Take 1 tablet (81 mg total) by mouth daily. (Patient  not taking: Reported on 10/13/2018) 30 tablet 3   No facility-administered medications prior to visit.      ROS Review of Systems  Constitutional: Negative for activity change, appetite change and fatigue.  HENT: Negative for congestion, sinus pressure and sore throat.   Eyes: Negative for visual disturbance.  Respiratory: Negative for cough, chest tightness, shortness of breath and wheezing.   Cardiovascular: Negative for chest pain and palpitations.  Gastrointestinal: Negative for abdominal distention, abdominal pain and constipation.  Endocrine: Negative for polydipsia.  Genitourinary: Negative for dysuria and  frequency.  Musculoskeletal: Negative for arthralgias and back pain.  Skin: Negative for rash.  Neurological: Negative for tremors, light-headedness and numbness.  Hematological: Does not bruise/bleed easily.  Psychiatric/Behavioral: Negative for agitation and behavioral problems.    Objective:  BP (!) 173/77   Pulse 84   Temp 98.2 F (36.8 C) (Oral)   Ht 4' 11.5" (1.511 m)   Wt 156 lb (70.8 kg)   SpO2 97%   BMI 30.98 kg/m   BP/Weight 10/13/2018 01/01/2018 08/27/2017  Systolic BP 173 146 127  Diastolic BP 77 78 81  Wt. (Lbs) 156 166.2 161.8  BMI 30.98 33.01 32.13      Physical Exam Constitutional:      Appearance: She is well-developed.  Neck:     Vascular: No JVD.  Cardiovascular:     Rate and Rhythm: Normal rate.     Heart sounds: Normal heart sounds. No murmur.  Pulmonary:     Effort: Pulmonary effort is normal.     Breath sounds: Normal breath sounds. No wheezing or rales.  Chest:     Chest wall: No tenderness.  Abdominal:     General: Bowel sounds are normal. There is no distension.     Palpations: Abdomen is soft. There is no mass.     Tenderness: There is no abdominal tenderness.  Musculoskeletal: Normal range of motion.     Right lower leg: No edema.     Left lower leg: No edema.  Neurological:     Mental Status: She is alert and oriented to person, place, and time.  Psychiatric:        Mood and Affect: Mood normal.     CMP Latest Ref Rng & Units 08/27/2017 02/28/2017 08/27/2016  Glucose 65 - 99 mg/dL 93 93 136(H)  BUN 8 - 27 mg/dL 14 10 10  Creatinine 0.57 - 1.00 mg/dL 0.76 0.60 0.61  Sodium 134 - 144 mmol/L 140 140 140  Potassium 3.5 - 5.2 mmol/L 4.4 4.4 4.5  Chloride 96 - 106 mmol/L 101 102 103  CO2 20 - 29 mmol/L 26 23 24  Calcium 8.7 - 10.3 mg/dL 9.4 9.3 8.7  Total Protein 6.0 - 8.5 g/dL 7.4 7.3 7.4  Total Bilirubin 0.0 - 1.2 mg/dL 0.3 0.2 0.2  Alkaline Phos 39 - 117 IU/L 155(H) 136(H) 161(H)  AST 0 - 40 IU/L 11 14 13  ALT 0 - 32 IU/L 12 14  10    Lipid Panel     Component Value Date/Time   CHOL 198 08/27/2017 0913   TRIG 131 08/27/2017 0913   HDL 54 08/27/2017 0913   CHOLHDL 3.7 08/27/2017 0913   CHOLHDL 3.4 02/19/2016 1021   VLDL 25 08/29/2015 0849   LDLCALC 118 (H) 08/27/2017 0913    CBC    Component Value Date/Time   WBC 6.4 08/27/2016 0919   WBC 8.0 09/11/2013 1459   RBC 4.83 08/27/2016 0919   RBC 5.22 (H)   09/11/2013 1459   HGB 12.2 08/27/2016 0919   HCT 38.2 08/27/2016 0919   PLT 355 08/27/2016 0919   MCV 79 08/27/2016 0919   MCH 25.3 (L) 08/27/2016 0919   MCH 26.1 09/11/2013 1459   MCHC 31.9 08/27/2016 0919   MCHC 33.4 09/11/2013 1459   RDW 15.2 08/27/2016 0919   LYMPHSABS 2.5 08/27/2016 0919   MONOABS 0.4 11/19/2012 1501   EOSABS 0.2 08/27/2016 0919   BASOSABS 0.0 08/27/2016 0919    Lab Results  Component Value Date   HGBA1C 9.8 (A) 10/13/2018    Assessment & Plan:   1. Type 2 diabetes mellitus with hyperglycemia, without long-term current use of insulin (HCC) Uncontrolled with A1c of 9.8 She endorsed running out of her medications for the last 3 weeks She is not open to commencing insulin and promises to work on lifestyle Counseled on Diabetic diet, my plate method, 150 minutes of moderate intensity exercise/week Keep blood sugar logs with fasting goals of 80-120 mg/dl, random of less than 180 and in the event of sugars less than 60 mg/dl or greater than 400 mg/dl please notify the clinic ASAP. It is recommended that you undergo annual eye exams and annual foot exams. Pneumonia vaccine is recommended. - POCT glucose (manual entry) - POCT glycosylated hemoglobin (Hb A1C) - CMP14+EGFR - Microalbumin / creatinine urine ratio; Future - Lipid panel; Future  2. Vaginal candidiasis - fluconazole (DIFLUCAN) 150 MG tablet; Take 1 tablet (150 mg total) by mouth once for 1 dose.  Dispense: 1 tablet; Refill: 0  3. Essential hypertension, benign Uncontrolled She is yet to take her  antihypertensive Compliance emphasized Counseled on blood pressure goal of less than 130/80, low-sodium, DASH diet, medication compliance, 150 minutes of moderate intensity exercise per week. Discussed medication compliance, adverse effects.   4. Hypothyroidism, unspecified type - TSH; Future - T4, free; Future  Advised dental form will be completed at next visit once her blood pressure has normalized.  Meds ordered this encounter  Medications  . fluconazole (DIFLUCAN) 150 MG tablet    Sig: Take 1 tablet (150 mg total) by mouth once for 1 dose.    Dispense:  1 tablet    Refill:  0    Follow-up: Return in about 1 week (around 10/20/2018) for hypertesion- in person.       Enobong Newlin, MD, FAAFP. [facility] [place], [place] [phone]   10/13/2018, 3:53 PM 

## 2018-10-14 ENCOUNTER — Ambulatory Visit: Payer: Self-pay | Attending: Family Medicine

## 2018-10-14 DIAGNOSIS — E039 Hypothyroidism, unspecified: Secondary | ICD-10-CM

## 2018-10-14 DIAGNOSIS — E1165 Type 2 diabetes mellitus with hyperglycemia: Secondary | ICD-10-CM

## 2018-10-14 DIAGNOSIS — I10 Essential (primary) hypertension: Secondary | ICD-10-CM

## 2018-10-15 LAB — LIPID PANEL
Chol/HDL Ratio: 5.1 ratio — ABNORMAL HIGH (ref 0.0–4.4)
Cholesterol, Total: 283 mg/dL — ABNORMAL HIGH (ref 100–199)
HDL: 55 mg/dL (ref >39)
LDL Chol Calc (NIH): 191 mg/dL — ABNORMAL HIGH (ref 0–99)
Triglycerides: 198 mg/dL — ABNORMAL HIGH (ref 0–149)
VLDL Cholesterol Cal: 37 mg/dL (ref 5–40)

## 2018-10-15 LAB — CMP14+EGFR
ALT: 17 IU/L (ref 0–32)
AST: 14 IU/L (ref 0–40)
Albumin/Globulin Ratio: 1.2 (ref 1.2–2.2)
Albumin: 4.2 g/dL (ref 3.7–4.7)
Alkaline Phosphatase: 191 IU/L — ABNORMAL HIGH (ref 39–117)
BUN/Creatinine Ratio: 14 (ref 12–28)
BUN: 13 mg/dL (ref 8–27)
Bilirubin Total: 0.4 mg/dL (ref 0.0–1.2)
CO2: 24 mmol/L (ref 20–29)
Calcium: 9.8 mg/dL (ref 8.7–10.3)
Chloride: 96 mmol/L (ref 96–106)
Creatinine, Ser: 0.9 mg/dL (ref 0.57–1.00)
GFR calc Af Amer: 74 mL/min/{1.73_m2} (ref >59)
GFR calc non Af Amer: 64 mL/min/{1.73_m2} (ref >59)
Globulin, Total: 3.4 g/dL (ref 1.5–4.5)
Glucose: 248 mg/dL — ABNORMAL HIGH (ref 65–99)
Potassium: 4.2 mmol/L (ref 3.5–5.2)
Sodium: 135 mmol/L (ref 134–144)
Total Protein: 7.6 g/dL (ref 6.0–8.5)

## 2018-10-15 LAB — T4, FREE: Free T4: 1.04 ng/dL (ref 0.82–1.77)

## 2018-10-15 LAB — MICROALBUMIN / CREATININE URINE RATIO
Creatinine, Urine: 240.1 mg/dL
Microalb/Creat Ratio: 23 mg/g creat (ref 0–29)
Microalbumin, Urine: 54.5 ug/mL

## 2018-10-15 LAB — TSH: TSH: 7.91 u[IU]/mL — ABNORMAL HIGH (ref 0.450–4.500)

## 2018-10-16 ENCOUNTER — Telehealth: Payer: Self-pay

## 2018-10-16 NOTE — Telephone Encounter (Signed)
-----   Message from Charlott Rakes, MD sent at 10/16/2018 11:05 AM EDT ----- Cholesterol is elevated, thyroid labs are abnormal. She had been out of her medications so no regimen change will be made at this time. Advise to comply with medications, low cholesterol diet, diabetic diet and take thyroid medication first thing in the morning on an empty stomach.

## 2018-10-16 NOTE — Telephone Encounter (Signed)
Patient name and DOB has been verified Patient was informed of lab results. Patient had no questions.  

## 2018-10-20 ENCOUNTER — Encounter: Payer: Self-pay | Admitting: Family Medicine

## 2018-10-20 ENCOUNTER — Ambulatory Visit: Payer: Self-pay | Attending: Family Medicine | Admitting: Family Medicine

## 2018-10-20 ENCOUNTER — Other Ambulatory Visit: Payer: Self-pay

## 2018-10-20 VITALS — BP 125/74 | HR 89 | Temp 98.3°F | Ht 59.5 in | Wt 153.4 lb

## 2018-10-20 DIAGNOSIS — E119 Type 2 diabetes mellitus without complications: Secondary | ICD-10-CM

## 2018-10-20 DIAGNOSIS — Z01818 Encounter for other preprocedural examination: Secondary | ICD-10-CM

## 2018-10-20 DIAGNOSIS — I10 Essential (primary) hypertension: Secondary | ICD-10-CM

## 2018-10-20 LAB — GLUCOSE, POCT (MANUAL RESULT ENTRY): POC Glucose: 174 mg/dl — AB (ref 70–99)

## 2018-10-20 NOTE — Progress Notes (Signed)
Subjective:  Patient ID: Linda House, female    DOB: May 05, 1945  Age: 73 y.o. MRN: [id number]  CC: Hypertension   HPI Linda House is a 73 year old female with a history of uncontrolled type 2 diabetes mellitus (A1c 9.8), hypothyroidism, hyperlipidemia, hypertension whois seen for follow-up visit today. At her last visit her blood pressure was significantly elevated and she required clearance for dental procedure (extraction of her teeth).  She had attributed elevated blood pressure to not taking her medications last week.  Today her blood pressure is controlled and she has been taking her antihypertensive. With regards to her uncontrolled diabetes mellitus we had discussed management at her last visit and she declined initiation of injectables were promised to work on her diet and lifestyle. She has no additional concerns today.  Past Medical History:  Diagnosis Date  . Diabetes mellitus   . Diabetes mellitus without complication ([place])   . Hypertension     No past surgical history on file.  Family History  Problem Relation Age of Onset  . Diabetes Father   . Hypertension Father     No Known Allergies  Outpatient Medications Prior to Visit  Medication Sig Dispense Refill  . amLODipine (NORVASC) 10 MG tablet TAKE 1 TABLET BY MOUTH DAILY. 30 tablet 2  . atorvastatin (LIPITOR) 40 MG tablet TAKE 1 TABLET BY MOUTH DAILY. 30 tablet 3  . cetirizine (ZYRTEC) 10 MG tablet Take 1 tablet (10 mg total) by mouth daily. 30 tablet 1  . cyclobenzaprine (FLEXERIL) 10 MG tablet TAKE 1 TABLET BY MOUTH AT BEDTIME. 30 tablet 0  . empagliflozin (JARDIANCE) 10 MG TABS tablet Take 10 mg by mouth daily. 30 tablet 6  . gabapentin (NEURONTIN) 300 MG capsule Take 1 capsule (300 mg total) by mouth at bedtime. 30 capsule 6  . glipiZIDE (GLIPIZIDE XL) 10 MG 24 hr tablet TAKE 2 TABLETS (20 MG TOTAL) BY MOUTH DAILY. 60 tablet 5  . glucose blood test strip Use as instructed 100  each 12  . glucose monitoring kit (FREESTYLE) monitoring kit 1 each by Does not apply route as needed. 1 each 0  . Lancets (FREESTYLE) lancets Use as instructed 100 each 12  . levothyroxine (SYNTHROID) 137 MCG tablet TAKE 1 TABLET (137 MCG TOTAL) BY MOUTH DAILY. 30 tablet 3  . lisinopril-hydrochlorothiazide (ZESTORETIC) 20-25 MG tablet TAKE 1 TABLET BY MOUTH DAILY. 30 tablet 2  . meloxicam (MOBIC) 7.5 MG tablet TAKE 1 TABLET BY MOUTH DAILY. 30 tablet 2  . metFORMIN (GLUCOPHAGE) 1000 MG tablet Take 1 tablet (1,000 mg total) by mouth 2 (two) times daily with a meal. 60 tablet 6  . olopatadine (PATANOL) 0.1 % ophthalmic solution Place 1 drop into both eyes 2 (two) times daily. 5 mL 1  . pioglitazone (ACTOS) 45 MG tablet Take 1 tablet (45 mg total) by mouth daily. 30 tablet 6  . amoxicillin (AMOXIL) 500 MG capsule Take 1 capsule (500 mg total) by mouth 3 (three) times daily. (Patient not taking: Reported on 08/27/2017) 30 capsule 0  . aspirin EC 81 MG tablet Take 1 tablet (81 mg total) by mouth daily. (Patient not taking: Reported on 10/13/2018) 30 tablet 3   No facility-administered medications prior to visit.      ROS Review of Systems  Constitutional: Negative for activity change, appetite change and fatigue.  HENT: Negative for congestion, sinus pressure and sore throat.   Eyes: Negative for visual disturbance.  Respiratory: Negative for cough, chest tightness, shortness of  breath and wheezing.   Cardiovascular: Negative for chest pain and palpitations.  Gastrointestinal: Negative for abdominal distention, abdominal pain and constipation.  Endocrine: Negative for polydipsia.  Genitourinary: Negative for dysuria and frequency.  Musculoskeletal: Negative for arthralgias and back pain.  Skin: Negative for rash.  Neurological: Negative for tremors, light-headedness and numbness.  Hematological: Does not bruise/bleed easily.  Psychiatric/Behavioral: Negative for agitation and behavioral  problems.    Objective:  BP 125/74   Pulse 89   Temp 98.3 F (36.8 C) (Oral)   Ht 4' 11.5" (1.511 m)   Wt 153 lb 6.4 oz (69.6 kg)   SpO2 97%   BMI 30.46 kg/m   BP/Weight 10/20/2018 10/13/2018 07/86/7544  Systolic BP 920 100 712  Diastolic BP 74 77 78  Wt. (Lbs) 153.4 156 166.2  BMI 30.46 30.98 33.01      Physical Exam Constitutional:      Appearance: She is well-developed.  Neck:     Vascular: No JVD.  Cardiovascular:     Rate and Rhythm: Normal rate.     Heart sounds: Normal heart sounds. No murmur.  Pulmonary:     Effort: Pulmonary effort is normal.     Breath sounds: Normal breath sounds. No wheezing or rales.  Chest:     Chest wall: No tenderness.  Abdominal:     General: Bowel sounds are normal. There is no distension.     Palpations: Abdomen is soft. There is no mass.     Tenderness: There is no abdominal tenderness.  Musculoskeletal: Normal range of motion.     Right lower leg: No edema.     Left lower leg: No edema.  Neurological:     Mental Status: She is alert and oriented to person, place, and time.  Psychiatric:        Mood and Affect: Mood normal.     CMP Latest Ref Rng & Units 10/14/2018 08/27/2017 02/28/2017  Glucose 65 - 99 mg/dL 248(H) 93 93  BUN 8 - 27 mg/dL _0 Creatinine 0.57 - 1.00 mg/dL 0.90 0.76 0.60  Sodium 134 - 144 mmol/L 135 140 140  Potassium 3.5 - 5.2 mmol/L 4.2 4.4 4.4  Chloride 96 - 106 mmol/L 96 101 102  CO2 20 - 29 mmol/L _1 Calcium 8.7 - 10.3 mg/dL 9.8 9.4 9.3  Total Protein 6.0 - 8.5 g/dL 7.6 7.4 7.3  Total Bilirubin 0.0 - 1.2 mg/dL 0.4 0.3 0.2  Alkaline Phos 39 - 117 IU/L 191(H) 155(H) 136(H)  AST 0 - 40 IU/L _2 ALT 0 - 32 IU/L _3 Lipid Panel     Component Value Date/Time   CHOL 283 (H) 10/14/2018 0844   TRIG 198 (H) 10/14/2018 0844   HDL 55 10/14/2018 0844   CHOLHDL 5.1 (H) 10/14/2018 0844   CHOLHDL 3.4 02/19/2016 1021   VLDL 25 08/29/2015 0849   LDLCALC 191 (H) 10/14/2018 0844     CBC    Component Value Date/Time   WBC 6.4 08/27/2016 0919   WBC 8.0 09/11/2013 1459   RBC 4.83 08/27/2016 0919   RBC 5.22 (H) 09/11/2013 1459   HGB 12.2 08/27/2016 0919   HCT 38.2 08/27/2016 0919   PLT 355 08/27/2016 0919   MCV 79 08/27/2016 0919   MCH 25.3 (L) 08/27/2016 0919   MCH 26.1 09/11/2013 1459   MCHC 31.9 08/27/2016 0919   MCHC 33.4 09/11/2013 1459   RDW 15.2 08/27/2016 0919  LYMPHSABS 2.5 08/27/2016 0919   MONOABS 0.4 11/19/2012 1501   EOSABS 0.2 08/27/2016 0919   BASOSABS 0.0 08/27/2016 0919    Lab Results  Component Value Date   HGBA1C 9.8 (A) 10/13/2018    Assessment & Plan:   1. Diabetes mellitus without complication ([place]) Uncontrolled with A1c of 9.8 This had been addressed last week and she will be working on her diet and lifestyle choices - POCT glucose (manual entry)  2. Essential hypertension, benign Blood pressure is improved significantly Counseled on blood pressure goal of less than 130/80, low-sodium, DASH diet, medication compliance, 150 minutes of moderate intensity exercise per week. Discussed medication compliance, adverse effects. I have completed her preop form for dental procedure  3. Preoperative clearance See #2 above    No orders of the defined types were placed in this encounter.   Follow-up: No follow-ups on file.       Charlott Rakes, MD, FAAFP. [facility] [place], [place]   10/20/2018, 2:27 PM

## 2018-10-22 ENCOUNTER — Telehealth: Payer: Self-pay

## 2018-10-22 NOTE — Telephone Encounter (Signed)
-----   Message from Charlott Rakes, MD sent at 10/16/2018 11:05 AM EDT ----- Cholesterol is elevated, thyroid labs are abnormal. She had been out of her medications so no regimen change will be made at this time. Advise to comply with medications, low cholesterol diet, diabetic diet and take thyroid medication first thing in the morning on an empty stomach.

## 2018-10-22 NOTE — Telephone Encounter (Signed)
Patient was called and informed of lab results. 

## 2018-10-27 ENCOUNTER — Telehealth: Payer: Self-pay | Admitting: Pharmacist

## 2018-10-27 MED FILL — MELOXICAM 7.5 MG TABLET: 7.5 | 30 days supply | Qty: 30 | Fill #1

## 2018-10-27 MED FILL — metFORMIN HCL 1000 MG TABS: 1000 | 30 days supply | Qty: 60 | Fill #2

## 2018-10-27 MED FILL — ?AMLODIPINE BESYLATE 10 MG: 10 | 30 days supply | Qty: 30 | Fill #0

## 2018-10-27 MED FILL — LEVOTHYROXINE 137 MCG TAB: 137 | 30 days supply | Qty: 30 | Fill #1

## 2018-10-27 MED FILL — glipiZIDE XL 10 MG TB24: 10 | 30 days supply | Qty: 60 | Fill #0

## 2018-10-27 MED FILL — LISINOPRIL-HCTZ 20-25 MG TA: 20-25 | 30 days supply | Qty: 30 | Fill #1

## 2018-10-27 MED FILL — ?ATORVASTATIN 40MG TABLET: 40 | 30 days supply | Qty: 30 | Fill #1

## 2018-10-27 NOTE — Telephone Encounter (Signed)
[facility] pharmacy unable to process patient assistance for Jardiance. However, they can do so with Linda House. Will send to PCP.   Can we change pt to Linda House?

## 2018-10-28 MED ORDER — DAPAGLIFLOZIN PROPANEDIOL 5 MG PO TABS: 5.0000 mg | ORAL_TABLET | Freq: Every day | ORAL | 1 refills | Status: DC

## 2018-10-28 NOTE — Addendum Note (Signed)
Addended by: Karle Plumber B on: 10/28/2018 08:40 AM   Modules accepted: Orders

## 2018-11-11 MED FILL — JARDIANCE 10 MG TABLET: 10 | 30 days supply | Qty: 30 | Fill #3

## 2018-11-30 MED FILL — ATORVASTATIN CALCIUM 40 MG: 40 | 30 days supply | Qty: 30 | Fill #2

## 2018-11-30 MED FILL — metFORMIN HCL 1000 MG TABS: 1000 | 30 days supply | Qty: 60 | Fill #3

## 2018-11-30 MED FILL — MELOXICAM 7.5 MG TABLET: 7.5 | 30 days supply | Qty: 30 | Fill #2

## 2018-11-30 MED FILL — glipiZIDE XL 10 MG TB24: 10 | 30 days supply | Qty: 60 | Fill #1

## 2018-11-30 MED FILL — LISINOPRIL-HCTZ 20-25 MG TA: 20-25 | 30 days supply | Qty: 30 | Fill #2

## 2018-11-30 MED FILL — AMLODIPINE BESYLATE 10 MG T: 10 | 30 days supply | Qty: 30 | Fill #1

## 2018-11-30 MED FILL — LEVOTHYROXINE 137 MCG TAB: 137 | 30 days supply | Qty: 30 | Fill #2

## 2019-01-06 ENCOUNTER — Other Ambulatory Visit: Payer: Self-pay | Admitting: Family Medicine

## 2019-01-06 DIAGNOSIS — M1712 Unilateral primary osteoarthritis, left knee: Secondary | ICD-10-CM

## 2019-01-06 MED FILL — metFORMIN HCL 1000 MG TABS: 1000 | 30 days supply | Qty: 60 | Fill #4

## 2019-01-06 MED FILL — MELOXICAM 7.5 MG TABLET: 7.5 | 30 days supply | Qty: 30 | Fill #0

## 2019-01-06 MED FILL — LEVOTHYROXINE 137 MCG TAB: 137 | 30 days supply | Qty: 30 | Fill #3

## 2019-01-06 MED FILL — AMLODIPINE BESYLATE 10 MG T: 10 | 30 days supply | Qty: 30 | Fill #2

## 2019-01-06 MED FILL — ATORVASTATIN CALCIUM 40 MG: 40 | 30 days supply | Qty: 30 | Fill #3

## 2019-01-06 MED FILL — LISINOPRIL-HCTZ 20-25 MG TA: 20-25 | 30 days supply | Qty: 30 | Fill #3

## 2019-01-06 MED FILL — glipiZIDE XL 10 MG TB24: 10 | 30 days supply | Qty: 60 | Fill #2

## 2019-01-20 ENCOUNTER — Ambulatory Visit: Payer: Self-pay | Admitting: Family Medicine

## 2019-02-03 ENCOUNTER — Other Ambulatory Visit: Payer: Self-pay

## 2019-02-03 ENCOUNTER — Encounter: Payer: Self-pay | Admitting: Family Medicine

## 2019-02-03 ENCOUNTER — Ambulatory Visit: Payer: Self-pay | Attending: Family Medicine | Admitting: Family Medicine

## 2019-02-03 VITALS — BP 127/70 | HR 83 | Temp 98.4°F | Ht 60.0 in | Wt 152.2 lb

## 2019-02-03 DIAGNOSIS — E1149 Type 2 diabetes mellitus with other diabetic neurological complication: Secondary | ICD-10-CM

## 2019-02-03 DIAGNOSIS — E1165 Type 2 diabetes mellitus with hyperglycemia: Secondary | ICD-10-CM

## 2019-02-03 DIAGNOSIS — Z1231 Encounter for screening mammogram for malignant neoplasm of breast: Secondary | ICD-10-CM

## 2019-02-03 DIAGNOSIS — R748 Abnormal levels of other serum enzymes: Secondary | ICD-10-CM

## 2019-02-03 DIAGNOSIS — E039 Hypothyroidism, unspecified: Secondary | ICD-10-CM

## 2019-02-03 DIAGNOSIS — I10 Essential (primary) hypertension: Secondary | ICD-10-CM

## 2019-02-03 DIAGNOSIS — E7800 Pure hypercholesterolemia, unspecified: Secondary | ICD-10-CM

## 2019-02-03 LAB — POCT GLYCOSYLATED HEMOGLOBIN (HGB A1C): HbA1c, POC (controlled diabetic range): 10.4 % — AB (ref 0.0–7.0)

## 2019-02-03 LAB — GLUCOSE, POCT (MANUAL RESULT ENTRY): POC Glucose: 132 mg/dl — AB (ref 70–99)

## 2019-02-03 MED ORDER — DAPAGLIFLOZIN PROPANEDIOL 5 MG PO TABS: 5.0000 mg | ORAL_TABLET | Freq: Every day | ORAL | 1 refills | Status: DC

## 2019-02-03 MED ORDER — METFORMIN HCL 1000 MG PO TABS: 1000.0000 mg | ORAL_TABLET | Freq: Two times a day (BID) | ORAL | 6 refills | Status: DC

## 2019-02-03 MED ORDER — PIOGLITAZONE HCL 45 MG PO TABS: 45.0000 mg | ORAL_TABLET | Freq: Every day | ORAL | 6 refills | Status: DC

## 2019-02-03 MED ORDER — LISINOPRIL-HYDROCHLOROTHIAZIDE 20-25 MG PO TABS: 1.0000 | ORAL_TABLET | Freq: Every day | ORAL | 6 refills | Status: DC

## 2019-02-03 MED ORDER — AMLODIPINE BESYLATE 10 MG PO TABS: 10.0000 mg | ORAL_TABLET | Freq: Every day | ORAL | 6 refills | Status: DC

## 2019-02-03 MED ORDER — GLIPIZIDE ER 10 MG PO TB24: ORAL_TABLET | ORAL | 5 refills | Status: DC

## 2019-02-03 MED ORDER — ATORVASTATIN CALCIUM 40 MG PO TABS: 40.0000 mg | ORAL_TABLET | Freq: Every day | ORAL | 6 refills | Status: DC

## 2019-02-03 MED FILL — PIOGLITAZONE HCL 45 MG TABS: 45 | 30 days supply | Qty: 30 | Fill #0

## 2019-02-03 MED FILL — metFORMIN HCL 1000 MG TABS: 1000 | 30 days supply | Qty: 60 | Fill #0

## 2019-02-03 MED FILL — LISINOPRIL-HCTZ 20-25 MG TA: 20-25 | 30 days supply | Qty: 30 | Fill #0

## 2019-02-03 MED FILL — !FARXIGA 5 MG TABLET: 5 | 30 days supply | Qty: 30 | Fill #0

## 2019-02-03 MED FILL — AMLODIPINE BESYLATE 10 MG T: 10 | 30 days supply | Qty: 30 | Fill #0

## 2019-02-03 MED FILL — glipiZIDE XL 10 MG TB24: 10 | 30 days supply | Qty: 60 | Fill #0

## 2019-02-03 MED FILL — ATORVASTATIN CALCIUM 40 MG: 40 | 30 days supply | Qty: 30 | Fill #0

## 2019-02-03 NOTE — Progress Notes (Signed)
Fasting has taken morning meds.

## 2019-02-03 NOTE — Progress Notes (Signed)
Established Patient Office Visit  Subjective:  Patient ID: Linda House, female    DOB: [birth year]  Age: 74 y.o. MRN: [id number]  CC:  Chief Complaint  Patient presents with  . Diabetes    HPI Linda House is a 74 year old female with a history of uncontrolled type 2 diabetes mellitus (A1c 10.4), hypertension, hyperlipidemia, and hypothyroidism who presents today for a follow up visit with the assistance of a spanish language interpreter.  Patient reports that she is not home her blood sugars or blood pressure at home.  She endorses compliance with medications, but upon reviewing medication list, patient has not been taking pioglitazone or [place].  Appointment scheduled with on site pharmacist for medication education.  Patient admits to eating foods that she shouldn't but focuses on portion control.  She is starting a walking exercise routine.  Her blood pressure is controlled today and she endorses medication compliance. Endorses compliance with statin medication and levothyroxine for hypothyroidism.    Patient has no acute complaints today.  Past Medical History:  Diagnosis Date  . Diabetes mellitus   . Diabetes mellitus without complication ([place])   . Hypertension     No past surgical history on file.  Family History  Problem Relation Age of Onset  . Diabetes Father   . Hypertension Father     Social History   Socioeconomic History  . Marital status: Married    Spouse name: Not on file  . Number of children: Not on file  . Years of education: Not on file  . Highest education level: Not on file  Occupational History  . Not on file  Tobacco Use  . Smoking status: Never Smoker  . Smokeless tobacco: Never Used  Substance and Sexual Activity  . Alcohol use: No  . Drug use: No  . Sexual activity: Never  Other Topics Concern  . Not on file  Social History Narrative   ** Merged History Encounter **       Social Determinants of Health     Financial Resource Strain:   . Difficulty of Paying Living Expenses: Not on file  Food Insecurity:   . Worried About Charity fundraiser in the Last Year: Not on file  . Ran Out of Food in the Last Year: Not on file  Transportation Needs:   . Lack of Transportation (Medical): Not on file  . Lack of Transportation (Non-Medical): Not on file  Physical Activity:   . Days of Exercise per Week: Not on file  . Minutes of Exercise per Session: Not on file  Stress:   . Feeling of Stress : Not on file  Social Connections:   . Frequency of Communication with Friends and Family: Not on file  . Frequency of Social Gatherings with Friends and Family: Not on file  . Attends Religious Services: Not on file  . Active Member of Clubs or Organizations: Not on file  . Attends Archivist Meetings: Not on file  . Marital Status: Not on file  Intimate Partner Violence:   . Fear of Current or Ex-Partner: Not on file  . Emotionally Abused: Not on file  . Physically Abused: Not on file  . Sexually Abused: Not on file    Outpatient Medications Prior to Visit  Medication Sig Dispense Refill  . aspirin EC 81 MG tablet Take 1 tablet (81 mg total) by mouth daily. 30 tablet 3  . cyclobenzaprine (FLEXERIL) 10 MG tablet TAKE 1 TABLET BY MOUTH  AT BEDTIME. 30 tablet 0  . glucose blood test strip Use as instructed 100 each 12  . glucose monitoring kit (FREESTYLE) monitoring kit 1 each by Does not apply route as needed. 1 each 0  . Lancets (FREESTYLE) lancets Use as instructed 100 each 12  . levothyroxine (SYNTHROID) 137 MCG tablet TAKE 1 TABLET (137 MCG TOTAL) BY MOUTH DAILY. 30 tablet 3  . meloxicam (MOBIC) 7.5 MG tablet TAKE 1 TABLET BY MOUTH DAILY. 30 tablet 2  . olopatadine (PATANOL) 0.1 % ophthalmic solution Place 1 drop into both eyes 2 (two) times daily. 5 mL 1  . amLODipine (NORVASC) 10 MG tablet TAKE 1 TABLET BY MOUTH DAILY. 30 tablet 2  . atorvastatin (LIPITOR) 40 MG tablet TAKE 1 TABLET BY  MOUTH DAILY. 30 tablet 3  . glipiZIDE (GLIPIZIDE XL) 10 MG 24 hr tablet TAKE 2 TABLETS (20 MG TOTAL) BY MOUTH DAILY. 60 tablet 5  . lisinopril-hydrochlorothiazide (ZESTORETIC) 20-25 MG tablet TAKE 1 TABLET BY MOUTH DAILY. 30 tablet 2  . metFORMIN (GLUCOPHAGE) 1000 MG tablet Take 1 tablet (1,000 mg total) by mouth 2 (two) times daily with a meal. 60 tablet 6  . amoxicillin (AMOXIL) 500 MG capsule Take 1 capsule (500 mg total) by mouth 3 (three) times daily. (Patient not taking: Reported on 08/27/2017) 30 capsule 0  . cetirizine (ZYRTEC) 10 MG tablet Take 1 tablet (10 mg total) by mouth daily. (Patient not taking: Reported on 02/03/2019) 30 tablet 1  . gabapentin (NEURONTIN) 300 MG capsule Take 1 capsule (300 mg total) by mouth at bedtime. (Patient not taking: Reported on 02/03/2019) 30 capsule 6  . dapagliflozin propanediol (FARXIGA) 5 MG TABS tablet Take 5 mg by mouth daily before breakfast. (Patient not taking: Reported on 02/03/2019) 90 tablet 1  . pioglitazone (ACTOS) 45 MG tablet Take 1 tablet (45 mg total) by mouth daily. (Patient not taking: Reported on 02/03/2019) 30 tablet 6   No facility-administered medications prior to visit.    No Known Allergies  ROS Review of Systems  Constitutional: Negative for fatigue, fever and unexpected weight change.  HENT: Negative for congestion, rhinorrhea, sinus pressure and sinus pain.   Eyes: Negative for visual disturbance.  Respiratory: Negative for cough, chest tightness and shortness of breath.   Cardiovascular: Negative for chest pain, palpitations and leg swelling.  Gastrointestinal: Negative for abdominal distention, abdominal pain, constipation, diarrhea, nausea and vomiting.  Endocrine: Negative for polydipsia and polyuria.  Genitourinary: Negative for decreased urine volume, difficulty urinating and dysuria.  Musculoskeletal: Negative for back pain and myalgias.  Skin: Negative for color change and rash.  Neurological: Negative for  dizziness, tremors, weakness and numbness.  Hematological: Does not bruise/bleed easily.  Psychiatric/Behavioral: Negative for agitation and behavioral problems.      Objective:    Physical Exam  Constitutional: She is oriented to person, place, and time. She appears well-developed and well-nourished. No distress.  HENT:  Head: Normocephalic and atraumatic.  Eyes: Pupils are equal, round, and reactive to light. Conjunctivae and EOM are normal.  Cardiovascular: Normal rate, regular rhythm, normal heart sounds and intact distal pulses.  No murmur heard. Pulmonary/Chest: Effort normal and breath sounds normal. No respiratory distress.  Abdominal: Soft. Bowel sounds are normal. She exhibits no distension. There is no abdominal tenderness.  Musculoskeletal:        General: Normal range of motion.     Cervical back: Normal range of motion and neck supple.  Neurological: She is alert and oriented to person, place,  and time.  Skin: Skin is warm and dry. No rash noted.  Psychiatric: She has a normal mood and affect. Her behavior is normal.  Nursing note and vitals reviewed.   BP 127/70   Pulse 83   Temp 98.4 F (36.9 C) (Oral)   Ht 5' (1.524 m)   Wt 152 lb 3.2 oz (69 kg)   SpO2 97%   BMI 29.72 kg/m  Wt Readings from Last 3 Encounters:  02/03/19 152 lb 3.2 oz (69 kg)  10/20/18 153 lb 6.4 oz (69.6 kg)  10/13/18 156 lb (70.8 kg)     Health Maintenance Due  Topic Date Due  . OPHTHALMOLOGY EXAM  08/02/1956  . TETANUS/TDAP  06/04/2015  . MAMMOGRAM  02/02/2017    There are no preventive care reminders to display for this patient.  Lab Results  Component Value Date   TSH 7.910 (H) 10/14/2018   Lab Results  Component Value Date   WBC 6.4 08/27/2016   HGB 12.2 08/27/2016   HCT 38.2 08/27/2016   MCV 79 08/27/2016   PLT 355 08/27/2016   Lab Results  Component Value Date   NA 135 10/14/2018   K 4.2 10/14/2018   CO2 24 10/14/2018   GLUCOSE 248 (H) 10/14/2018   BUN 13  10/14/2018   CREATININE 0.90 10/14/2018   BILITOT 0.4 10/14/2018   ALKPHOS 191 (H) 10/14/2018   AST 14 10/14/2018   ALT 17 10/14/2018   PROT 7.6 10/14/2018   ALBUMIN 4.2 10/14/2018   CALCIUM 9.8 10/14/2018   ANIONGAP 13 09/11/2013   Lab Results  Component Value Date   CHOL 283 (H) 10/14/2018   Lab Results  Component Value Date   HDL 55 10/14/2018   Lab Results  Component Value Date   LDLCALC 191 (H) 10/14/2018   Lab Results  Component Value Date   TRIG 198 (H) 10/14/2018   Lab Results  Component Value Date   CHOLHDL 5.1 (H) 10/14/2018   Lab Results  Component Value Date   HGBA1C 10.4 (A) 02/03/2019      Assessment & Plan:   1. Type 2 diabetes mellitus with hyperglycemia, without long-term current use of insulin (HCC) Uncontrolled with A1c 10.4 Patient has not been taking [place] or pioglitazone so we will have her begin these Follow up appointment with pharmacist for medication review Continue metformin and glipizide Counseled on Diabetic diet, my plate method, 240 minutes of moderate intensity exercise/week Keep blood sugar logs with fasting goals of 80-120 mg/dl, random of less than 180 and in the event of sugars less than 60 mg/dl or greater than 400 mg/dl please notify the clinic ASAP. It is recommended that you undergo annual diabetic eye exams Foot exam completed 09/2018 - POCT glucose (manual entry) - POCT glycosylated hemoglobin (Hb A1C) - CMP14+EGFR - Lipid panel - Microalbumin / creatinine urine ratio - dapagliflozin propanediol (FARXIGA) 5 MG TABS tablet; Take 5 mg by mouth daily before breakfast.  Dispense: 90 tablet; Refill: 1  2. Essential hypertension, benign Controlled Counseled on blood pressure goal of less than 130/80, low-sodium, DASH diet, medication compliance, 150 minutes of moderate intensity exercise per week. Discussed medication compliance, adverse effects. - amLODipine (NORVASC) 10 MG tablet; Take 1 tablet (10 mg total) by mouth  daily.  Dispense: 30 tablet; Refill: 6 - lisinopril-hydrochlorothiazide (ZESTORETIC) 20-25 MG tablet; Take 1 tablet by mouth daily.  Dispense: 30 tablet; Refill: 6  3. Pure hypercholesterolemia Uncontrolled Low cholesterol diet Counseled on 150 minutes of moderate intensity  exercise per week - atorvastatin (LIPITOR) 40 MG tablet; Take 1 tablet (40 mg total) by mouth daily.  Dispense: 30 tablet; Refill: 6  4. Type 2 diabetes mellitus with other neurologic complication, without long-term current use of insulin (HCC) See #1 - glipiZIDE (GLIPIZIDE XL) 10 MG 24 hr tablet; TAKE 2 TABLETS (20 MG TOTAL) BY MOUTH DAILY.  Dispense: 60 tablet; Refill: 5 - metFORMIN (GLUCOPHAGE) 1000 MG tablet; Take 1 tablet (1,000 mg total) by mouth 2 (two) times daily with a meal.  Dispense: 60 tablet; Refill: 6 - pioglitazone (ACTOS) 45 MG tablet; Take 1 tablet (45 mg total) by mouth daily.  Dispense: 30 tablet; Refill: 6  5. Encounter for screening mammogram for malignant neoplasm of breast Screening - MM Digital Screening; Future  6. Elevated alkaline phosphatase level Upward trend - Gamma GT  7. Hypothyroidism, unspecified type Uncontrolled at last check due to non compliance with medication Continue medication compliance - TSH - T4, free   Meds ordered this encounter  Medications  . amLODipine (NORVASC) 10 MG tablet    Sig: Take 1 tablet (10 mg total) by mouth daily.    Dispense:  30 tablet    Refill:  6  . atorvastatin (LIPITOR) 40 MG tablet    Sig: Take 1 tablet (40 mg total) by mouth daily.    Dispense:  30 tablet    Refill:  6  . dapagliflozin propanediol (FARXIGA) 5 MG TABS tablet    Sig: Take 5 mg by mouth daily before breakfast.    Dispense:  90 tablet    Refill:  1  . glipiZIDE (GLIPIZIDE XL) 10 MG 24 hr tablet    Sig: TAKE 2 TABLETS (20 MG TOTAL) BY MOUTH DAILY.    Dispense:  60 tablet    Refill:  5  . lisinopril-hydrochlorothiazide (ZESTORETIC) 20-25 MG tablet    Sig: Take 1  tablet by mouth daily.    Dispense:  30 tablet    Refill:  6  . metFORMIN (GLUCOPHAGE) 1000 MG tablet    Sig: Take 1 tablet (1,000 mg total) by mouth 2 (two) times daily with a meal.    Dispense:  60 tablet    Refill:  6  . pioglitazone (ACTOS) 45 MG tablet    Sig: Take 1 tablet (45 mg total) by mouth daily.    Dispense:  30 tablet    Refill:  6    Follow-up: Return in about 1 week (around 02/10/2019) for medication reconciliation with Lurena Joiner; diabetes with PCP 3 months-in person.    Tomasita Morrow, RN

## 2019-02-03 NOTE — Patient Instructions (Signed)
Informacin bsica sobre la diabetes Diabetes Basics  La diabetes (diabetes mellitus) es una enfermedad de larga duracin (crnica). Se produce cuando el cuerpo no utiliza Occupational hygienist (glucosa) que se libera de los alimentos despus de comer. La diabetes puede deberse a uno de [employer] o a ambos:  El pncreas no produce suficiente cantidad de una hormona llamada insulina.  El cuerpo no reacciona de forma normal a la insulina que produce. La insulina permite que ciertos azcares (glucosa) ingresen a las clulas del cuerpo. Esto le proporciona energa. Si tiene diabetes, los azcares no pueden ingresar a las clulas. Esto produce un aumento del nivel de Dispensing optician (hiperglucemia). Sigue estas instrucciones en tu casa: Cmo se trata la diabetes? Es posible que tenga que administrarse insulina u otros medicamentos para la diabetes todos los [place] para mantener el nivel de Location manager en la sangre equilibrado. Adminstrese los medicamentos para la diabetes todos los [place] se lo haya indicado el mdico. Haga una lista de los medicamentos para la diabetes aqu: Medicamentos para la diabetes  Nombre del medicamento: ______________________________ ? Cantidad (dosis): ________________ Mellody Drown (a.m./p.m.): _______________ Wilford Grist: ___________________________________  Micki Riley medicamento: ______________________________ ? Cantidad (dosis): ________________ Mellody Drown (a.m./p.m.): _______________ Wilford Grist: ___________________________________  Micki Riley medicamento: ______________________________ ? Cantidad (dosis): ________________ Mellody Drown (a.m./p.m.): _______________ Wilford Grist: ___________________________________ Si [place] insulina, aprender cmo aplicrsela con inyecciones. Es posible que deba ajustar la cantidad en funcin de los alimentos que coma. Haga una lista de los tipos de [place] aqu: Insulina  Tipo de insulina: ______________________________ ? Cantidad (dosis):  ________________ Mellody Drown (a.m./p.m.): _______________ Wilford Grist: ___________________________________  Suezanne Jacquet: ______________________________ ? Cantidad (dosis): ________________ Mellody Drown (a.m./p.m.): _______________ Wilford Grist: ___________________________________  Suezanne Jacquet: ______________________________ ? Cantidad (dosis): ________________ Mellody Drown (a.m./p.m.): _______________ Wilford Grist: ___________________________________  Suezanne Jacquet: ______________________________ ? Cantidad (dosis): ________________ Mellody Drown (a.m./p.m.): _______________ Wilford Grist: ___________________________________  Suezanne Jacquet: ______________________________ ? Cantidad (dosis): ________________ Mellody Drown (a.m./p.m.): _______________ Wilford Grist: ___________________________________ Cmo me controlo el nivel de azcar en la sangre?  Controle sus niveles de azcar en la sangre con un medidor de glucemia segn las indicaciones del mdico. El mdico fijar los objetivos del tratamiento para usted. Generalmente, los resultados de los niveles de azcar en la sangre deben ser los siguientes:  Antes de las comidas (preprandial): de 80 a 133m/dl (de 4,4 a 7,284ml/l).  Despus de las comidas (posprandial): por debajo de 18049ml (52m65ml).  Nivel de A1c: menos del 7%. Anote las veces que se controlar los niveles de azcar en la sangre: Controles de azcar en la sangre  Hora: _______________ NotaWilford Grist_________________________________  HoraMellody Drown_____________ Notas: ___________________________________  Hora: _______________ Notas: ___________________________________  Hora: _______________ Notas: ___________________________________  Hora: _______________ Notas: ___________________________________  Hora: _______________ Notas: ___________________________________  Qu dSander Nephewo saber acerca del nivel bajo de azcar en la sangre? Un nivel bajo de azcar en la sangre se denomina hipoglucemia. Este cuadro ocurre cuando el  nivel de azcar en la sangre es igual o menor que 70mg71m(3,9mmol51m. Entre los sntomas, se pueden incluir los siguientes:  Sentir: ? HambreMount Enterpriseeocupacin o nervios (ansiedad). ? Sudoracin y piel hIntel Corporationnfusin. ? Mareos. ? Somnolencia. ? Ganas de vomitar (nuseas).  Tener: ? Latidos cardacos acelerados. ? Dolor de cabezaNetherlandsmbios en la visin. ? Hormigueo y falta de sensibilidad (entumecimiento) alrededor de la boca, los labios o la lenguaHawkinsvillevimientos espasmdicos que no puede controlar (convulsiones).  Dificultades para hacer lo siguiente: ? Moverse (coordinacin). ? Dormir. ? Desmayos. ? Molestarse con facilidad (irritabilidad). Tratamiento del nivelCactus  bajo de azcar en la sangre Para tratar un nivel bajo de azcar en la sangre, ingiera un alimento o una bebida azucarada de inmediato. Si puede pensar con claridad y tragar de manera segura, siga la regla 15/15, que consiste en lo siguiente:  Consuma 15gramos de un hidrato de carbono de accin rpida (carbohidrato). Hable con su mdico acerca de cunto debera consumir.  Algunos hidratos de carbono de accin rpida son: ? Comprimidos de azcar (pastillas de glucosa). Consuma 3o 4pastillas de glucosa. ? De 6 a 8unidades de caramelos duros. ? De 4 a 6onzas (de 120 a 145m) de jugo de frutas. ? De 4 a 6onzas (de 120 a 1573m de refresco comn (no diettico). ? 1 cucharada (1552mde miel o azcar.  Contrlese el nivel de azcar en la sangre 61m41mos despus de ingerir el hidrato de carbono.  Si el nivel de azcar en la sangre todava es igual o menor que 70mg16m(3,9mmol52m, ingiera nuevamente 15gramos de un hidrato de carbono.  Si el nivel de azcar en la sangre no supera los 70mg/d105m,9mmol/l89mespus de 3intentos, solicite ayuda de inmediato.  Ingiera una comida o una colacin en el transcurso de 1hora despus de que el nivel de azcar en la sangre se haya normalizado. Tratamiento del nivel  muy bajo de azcar en la sangre Si el nivel de azcar en la sangre es igual o menor que 54mg/dl 27mol/l),47mgnifica que est muy bajo (hipoglucemia grave). Esto es una emergeEngineer, maintenance (IT)e a ver si los sntomas desaparecen. Solicite atencin mdica de inmediato. Comunquese con el servicio de emergencias de su localidad (911 en los [place]). No conduzca por sus propios medios hasta el hGoldman Sachs Preguntas para hacerle al mdico  Es necesario que me rena con un instrucRadio broadcast assistantdado de la diabetes?  Qu equipos necesitar para cuidarme en casa?  Qu medicamentos para la diabetes necesito? Cundo debo tomarlos?  Con qu frecuencia debo controlar mi nivel de azcar en la sangre?  A qu nmero puedo llamar si tengo preguntas?  Cundo es mi prxima cita con el mdico?  Dnde puedo encontrar un grupo de apoyo para las personas con diabetes? Dnde buscar ms informacin  American Diabetes Association (Asociacin Estadounidense de la Diabetes): www.diabetes.org  American Association of Diabetes Educators (Asociacin Estadounidense de Instructores para el Cuidado de la Diabetes): www.diabeteseducator.org/patient-resources Comunquese con un mdico si:  El nivel de azcar en la sangre es igual o mayor que 240mg/dl (61mmol/dl) d63mnte 2das seguidos.  Ha estado enfermo o ha tenido fiebre durante 2das o ms y no mejora.  Tiene alguno de estos problemas durante ms de 6horas: ? No puede comer ni beber. ? Siente malestar estomacal (nuseas). ? Vomita. ? Presenta heces lquidas (diarrea). Solicite ayuda inmediatamente si:  El nivel de azcar en la sangre est por debajo de 54mg/dl (3mm65m).  Se53mente confundida.  Tiene dificultad para hacer lo siguiente: ? Pensar con claridad. ? La respiracin. Resumen  La diabetes (diabetes mellitus) es una enfermedad de larga duracin (crnica). Se produce cuando el cuerpo no utiliza correctamente Occupational hygieniste se libera de los alimentos despus de la digestin.  Aplquese la insulina y tome los medicamentos para la diabetes como se lo hayan indicado.  Contrlese el nivel de azcar en la sangre todos los das, con la frDow Cityencia que le hayan indicado.  Concurra a todas las visitas de seguimiento como se lo haya indicado el mdico. Esto es importante. Esta informacin no tiene como fin reempMarine scientist  el consejo del mdico. Asegrese de hacerle al mdico cualquier pregunta que tenga. Document Revised: 02/25/2018 Document Reviewed: 05/09/2017 Elsevier Patient Education  [place].

## 2019-02-04 ENCOUNTER — Other Ambulatory Visit: Payer: Self-pay | Admitting: Family Medicine

## 2019-02-04 DIAGNOSIS — E039 Hypothyroidism, unspecified: Secondary | ICD-10-CM

## 2019-02-04 LAB — CMP14+EGFR
ALT: 24 IU/L (ref 0–32)
AST: 20 IU/L (ref 0–40)
Albumin/Globulin Ratio: 1.3 (ref 1.2–2.2)
Albumin: 4.4 g/dL (ref 3.7–4.7)
Alkaline Phosphatase: 183 IU/L — ABNORMAL HIGH (ref 39–117)
BUN/Creatinine Ratio: 24 (ref 12–28)
BUN: 17 mg/dL (ref 8–27)
Bilirubin Total: 0.5 mg/dL (ref 0.0–1.2)
CO2: 25 mmol/L (ref 20–29)
Calcium: 9.8 mg/dL (ref 8.7–10.3)
Chloride: 99 mmol/L (ref 96–106)
Creatinine, Ser: 0.71 mg/dL (ref 0.57–1.00)
GFR calc Af Amer: 98 mL/min/{1.73_m2} (ref >59)
GFR calc non Af Amer: 85 mL/min/{1.73_m2} (ref >59)
Globulin, Total: 3.3 g/dL (ref 1.5–4.5)
Glucose: 127 mg/dL — ABNORMAL HIGH (ref 65–99)
Potassium: 4.3 mmol/L (ref 3.5–5.2)
Sodium: 138 mmol/L (ref 134–144)
Total Protein: 7.7 g/dL (ref 6.0–8.5)

## 2019-02-04 LAB — GAMMA GT: GGT: 46 IU/L (ref 0–60)

## 2019-02-04 LAB — LIPID PANEL
Chol/HDL Ratio: 4.3 ratio (ref 0.0–4.4)
Cholesterol, Total: 239 mg/dL — ABNORMAL HIGH (ref 100–199)
HDL: 56 mg/dL (ref >39)
LDL Chol Calc (NIH): 148 mg/dL — ABNORMAL HIGH (ref 0–99)
Triglycerides: 192 mg/dL — ABNORMAL HIGH (ref 0–149)
VLDL Cholesterol Cal: 35 mg/dL (ref 5–40)

## 2019-02-04 LAB — MICROALBUMIN / CREATININE URINE RATIO
Creatinine, Urine: 57.5 mg/dL
Microalb/Creat Ratio: 9 mg/g creat (ref 0–29)
Microalbumin, Urine: 5.4 ug/mL

## 2019-02-04 LAB — TSH: TSH: 1.91 u[IU]/mL (ref 0.450–4.500)

## 2019-02-04 LAB — T4, FREE: Free T4: 1.41 ng/dL (ref 0.82–1.77)

## 2019-02-04 MED FILL — LEVOTHYROXINE 137 MCG TAB: 137 | 30 days supply | Qty: 30 | Fill #0

## 2019-02-04 NOTE — Telephone Encounter (Signed)
1) Medication(s) Requested (by name): -levothyroxine (SYNTHROID) 137 MCG tablet   2) Pharmacy of Choice: -[facility] 3) Special Requests:

## 2019-02-05 ENCOUNTER — Other Ambulatory Visit: Payer: Self-pay | Admitting: Family Medicine

## 2019-02-05 DIAGNOSIS — E7800 Pure hypercholesterolemia, unspecified: Secondary | ICD-10-CM

## 2019-02-05 MED ORDER — ATORVASTATIN CALCIUM 80 MG PO TABS: 80.0000 mg | ORAL_TABLET | Freq: Every day | ORAL | 3 refills | Status: DC

## 2019-02-08 MED FILL — ATORVASTATIN 80 MG TABLET: 80 | 30 days supply | Qty: 30 | Fill #0

## 2019-02-11 ENCOUNTER — Telehealth: Payer: Self-pay

## 2019-02-11 ENCOUNTER — Other Ambulatory Visit: Payer: Self-pay

## 2019-02-11 ENCOUNTER — Ambulatory Visit: Payer: Self-pay | Attending: Family Medicine | Admitting: Pharmacist

## 2019-02-11 DIAGNOSIS — E1165 Type 2 diabetes mellitus with hyperglycemia: Secondary | ICD-10-CM

## 2019-02-11 MED ORDER — TRUE METRIX METER W/DEVICE KIT: PACK | 0 refills | Status: AC

## 2019-02-11 MED ORDER — GLUCOSE BLOOD VI STRP: ORAL_STRIP | 11 refills | Status: AC

## 2019-02-11 MED ORDER — TRUEPLUS LANCETS 28G MISC: 11 refills | Status: AC

## 2019-02-11 MED FILL — TRUE METRIX GLUCOSE TEST ST: 25 days supply | Qty: 100 | Fill #0

## 2019-02-11 MED FILL — !TRUE METRIX BLOOD GLUCOSE: 1 days supply | Qty: 1 | Fill #0

## 2019-02-11 MED FILL — TRUEplus LANCETS 28G MISC: 25 days supply | Qty: 100 | Fill #0

## 2019-02-11 NOTE — Progress Notes (Signed)
S:  PCP: Dr. Margarita Rana  Patient arrives in good spirits. She presents for medication reconciliation at the request of her PCP. PCP last saw and referred the patient 02/03/2019.   Outpatient Encounter Medications as of 02/11/2019  Medication Sig Note  . amLODipine (NORVASC) 10 MG tablet Take 1 tablet (10 mg total) by mouth daily.   Marland Kitchen aspirin EC 81 MG tablet Take 1 tablet (81 mg total) by mouth daily.   Marland Kitchen atorvastatin (LIPITOR) 80 MG tablet Take 1 tablet (80 mg total) by mouth daily. 02/11/2019: Patient brings in 40 mg Lipitor bottles. She reports she has 80 mg at home. I have collected her 40 mg bottle to dispose of and encouraged compliance with 80 mg.   . [DISCONTINUED] glucose blood test strip Use as instructed   . amoxicillin (AMOXIL) 500 MG capsule Take 1 capsule (500 mg total) by mouth 3 (three) times daily. (Patient not taking: Reported on 08/27/2017)   . Blood Glucose Monitoring Suppl (TRUE METRIX METER) w/Device KIT Use to check blood sugar daily. E11.65   . cetirizine (ZYRTEC) 10 MG tablet Take 1 tablet (10 mg total) by mouth daily. (Patient not taking: Reported on 02/03/2019)   . cyclobenzaprine (FLEXERIL) 10 MG tablet TAKE 1 TABLET BY MOUTH AT BEDTIME.   . dapagliflozin propanediol (FARXIGA) 5 MG TABS tablet Take 5 mg by mouth daily before breakfast. (Patient not taking: Reported on 02/11/2019) 02/11/2019: Brings in bottle but it is sealed. She did not start because she is unsure if it is safe to take with her other DM medications.   . gabapentin (NEURONTIN) 300 MG capsule Take 1 capsule (300 mg total) by mouth at bedtime. (Patient not taking: Reported on 02/11/2019)   . glipiZIDE (GLIPIZIDE XL) 10 MG 24 hr tablet TAKE 2 TABLETS (20 MG TOTAL) BY MOUTH DAILY.   Marland Kitchen glucose blood test strip Use as instructed to check blood sugar daily. E11.65   . levothyroxine (SYNTHROID) 137 MCG tablet TAKE 1 TABLET (137 MCG TOTAL) BY MOUTH DAILY.   Marland Kitchen lisinopril-hydrochlorothiazide (ZESTORETIC) 20-25 MG tablet Take  1 tablet by mouth daily.   . meloxicam (MOBIC) 7.5 MG tablet TAKE 1 TABLET BY MOUTH DAILY.   . metFORMIN (GLUCOPHAGE) 1000 MG tablet Take 1 tablet (1,000 mg total) by mouth 2 (two) times daily with a meal.   . olopatadine (PATANOL) 0.1 % ophthalmic solution Place 1 drop into both eyes 2 (two) times daily.   . pioglitazone (ACTOS) 45 MG tablet Take 1 tablet (45 mg total) by mouth daily.   . TRUEplus Lancets 28G MISC Use to check blood sugar daily. E11.65   . [DISCONTINUED] Calcium 500-125 MG-UNIT TABS Take 1 tablet by mouth daily. (Patient not taking: Reported on 12/31/2013)   . [DISCONTINUED] glucose monitoring kit (FREESTYLE) monitoring kit 1 each by Does not apply route as needed.   . [DISCONTINUED] Lancets (FREESTYLE) lancets Use as instructed    No facility-administered encounter medications on file as of 02/11/2019.   Medication Adherence Questionnaire (A score of 2 or more points indicates risk for nonadherence)  Do you know what each of your medicines is for? 0 (1 point if no)  Do you ever have trouble remembering to take your medicine? 2 (2 points if yes)  Do you ever not take a medicine because you feel you do not need it?  1 (1 point if yes)  Do you think that any of your medicines is not helping you? 0 (1 point if yes)  Do you  have any physical problems such as vision loss that keep you from taking your medicines as prescribed?  0 (2 points if yes)  Do you think any of your medicine is causing a side effect?  0 (1 point if yes)  Do you know the names of ALL of your medicines? 0 (1 point if no)  Do you think that you need ALL of your medicines? 1 (1 point if no)  In the past 6 months, have you missed getting a refill or a new prescription filled on time?  1 (1 point if yes)  How often do you miss taking a dose of medicine?  2 Never (0 points), 1 or 2 times a month (0 points), 1 time a week (2 points), 2 or more times a week (2 points).   TOTAL SCORE 7   A/P:  Patient has known  adherence challenges. Barriers include: lack of knowledge, forgetfulness, physical barriers such as language. She also has remained adamant about avoiding injectable medications despite poor glycemic control with oral medications.  Specific problems identified: Does not check sugar at home. Pt is not taking 2 tabs before breakfast of glipizide XL. Has not started [place] for fear of interaction with other medications.  Still taking 40 mg daily of atorvastatin.   Problems were addressed and reconciliation provided. Patient voiced understanding and is amenable to changes. She agrees to take medications, including Wilder Glade, as prescribed. We talked about ideal timing and meal regards as well. Additionally, I have sent prescriptions for glucometer and supplies so she can begin checking her CBGs at home.  Time spent face to face counseling: 30 minutes.  Follow-up with PCP as planned.   Benard Halsted, PharmD, [place] [phone]

## 2019-02-11 NOTE — Telephone Encounter (Signed)
-----   Message from Charlott Rakes, MD sent at 02/05/2019  5:37 PM EST ----- Cholesterol is elevated and so I have raised her Atorvastatin from 40 to 80mg . Thyroid labs are normal, continue current dose of Levothyroxine. Advise to comply with a low cholesterol diet.

## 2019-02-11 NOTE — Telephone Encounter (Signed)
Patient name and DOB has been verified Patient was informed of lab results. Patient had no questions.  

## 2019-03-09 ENCOUNTER — Other Ambulatory Visit: Payer: Self-pay

## 2019-03-09 ENCOUNTER — Emergency Department (HOSPITAL_COMMUNITY): Payer: Self-pay

## 2019-03-09 ENCOUNTER — Encounter (HOSPITAL_COMMUNITY): Payer: Self-pay | Admitting: Emergency Medicine

## 2019-03-09 ENCOUNTER — Emergency Department (HOSPITAL_COMMUNITY)
Admission: EM | Admit: 2019-03-09 | Discharge: 2019-03-10 | Disposition: A | Discharge status: Home / Self Care | Payer: Self-pay | Attending: Emergency Medicine | Admitting: Emergency Medicine

## 2019-03-09 DIAGNOSIS — R29898 Other symptoms and signs involving the musculoskeletal system: Secondary | ICD-10-CM | POA: Insufficient documentation

## 2019-03-09 DIAGNOSIS — Z5321 Procedure and treatment not carried out due to patient leaving prior to being seen by health care provider: Secondary | ICD-10-CM | POA: Insufficient documentation

## 2019-03-09 LAB — COMPREHENSIVE METABOLIC PANEL
ALT: 21 U/L (ref 0–44)
AST: 21 U/L (ref 15–41)
Albumin: 3.9 g/dL (ref 3.5–5.0)
Alkaline Phosphatase: 126 U/L (ref 38–126)
Anion gap: 10 (ref 5–15)
BUN: 31 mg/dL — ABNORMAL HIGH (ref 8–23)
CO2: 23 mmol/L (ref 22–32)
Calcium: 9.5 mg/dL (ref 8.9–10.3)
Chloride: 103 mmol/L (ref 98–111)
Creatinine, Ser: 0.84 mg/dL (ref 0.44–1.00)
GFR calc Af Amer: >60 mL/min (ref >60)
GFR calc non Af Amer: >60 mL/min (ref >60)
Glucose, Bld: 70 mg/dL (ref 70–99)
Potassium: 3.3 mmol/L — ABNORMAL LOW (ref 3.5–5.1)
Sodium: 136 mmol/L (ref 135–145)
Total Bilirubin: 0.3 mg/dL (ref 0.3–1.2)
Total Protein: 7.5 g/dL (ref 6.5–8.1)

## 2019-03-09 LAB — DIFFERENTIAL
Abs Immature Granulocytes: 0.03 10*3/uL (ref 0.00–0.07)
Basophils Absolute: 0 10*3/uL (ref 0.0–0.1)
Basophils Relative: 0 %
Eosinophils Absolute: 0.3 10*3/uL (ref 0.0–0.5)
Eosinophils Relative: 3 %
Immature Granulocytes: 0 %
Lymphocytes Relative: 49 %
Lymphs Abs: 6.4 10*3/uL — ABNORMAL HIGH (ref 0.7–4.0)
Monocytes Absolute: 0.7 10*3/uL (ref 0.1–1.0)
Monocytes Relative: 6 %
Neutro Abs: 5.6 10*3/uL (ref 1.7–7.7)
Neutrophils Relative %: 42 %

## 2019-03-09 LAB — APTT: aPTT: 30 seconds (ref 24–36)

## 2019-03-09 LAB — CBC
HCT: 43.4 % (ref 36.0–46.0)
Hemoglobin: 13.6 g/dL (ref 12.0–15.0)
MCH: 25.7 pg — ABNORMAL LOW (ref 26.0–34.0)
MCHC: 31.3 g/dL (ref 30.0–36.0)
MCV: 81.9 fL (ref 80.0–100.0)
Platelets: 384 10*3/uL (ref 150–400)
RBC: 5.3 MIL/uL — ABNORMAL HIGH (ref 3.87–5.11)
RDW: 14.5 % (ref 11.5–15.5)
WBC: 13.4 10*3/uL — ABNORMAL HIGH (ref 4.0–10.5)
nRBC: 0 % (ref 0.0–0.2)

## 2019-03-09 LAB — PROTIME-INR
INR: 0.9 (ref 0.8–1.2)
Prothrombin Time: 11.8 seconds (ref 11.4–15.2)

## 2019-03-09 NOTE — ED Triage Notes (Signed)
Pt reports arm numbness/weakness and dizziness that "comes and goes" X2 days.  Pt has negative NIH.    Granddaughter Delorise Royals [phone]

## 2019-03-12 MED FILL — LEVOTHYROXINE 137 MCG TAB: 137 | 30 days supply | Qty: 30 | Fill #1

## 2019-03-12 MED FILL — !FARXIGA 5 MG TABLET: 5 | 30 days supply | Qty: 30 | Fill #1

## 2019-03-12 MED FILL — LISINOPRIL-HCTZ 20-25 MG TA: 20-25 | 30 days supply | Qty: 30 | Fill #1

## 2019-03-12 MED FILL — glipiZIDE XL 10 MG TB24: 10 | 30 days supply | Qty: 60 | Fill #1

## 2019-03-12 MED FILL — ATORVASTATIN 80 MG TABLET: 80 | 30 days supply | Qty: 30 | Fill #1

## 2019-03-12 MED FILL — PIOGLITAZONE HCL 45 MG TABS: 45 | 30 days supply | Qty: 30 | Fill #1

## 2019-03-13 ENCOUNTER — Encounter (HOSPITAL_COMMUNITY): Payer: Self-pay | Admitting: Emergency Medicine

## 2019-03-13 ENCOUNTER — Other Ambulatory Visit: Payer: Self-pay

## 2019-03-13 ENCOUNTER — Ambulatory Visit (HOSPITAL_COMMUNITY)
Admission: EM | Admit: 2019-03-13 | Discharge: 2019-03-13 | Disposition: A | Discharge status: Home / Self Care | Payer: Self-pay | Attending: Urgent Care | Admitting: Urgent Care

## 2019-03-13 DIAGNOSIS — R739 Hyperglycemia, unspecified: Secondary | ICD-10-CM

## 2019-03-13 DIAGNOSIS — R42 Dizziness and giddiness: Secondary | ICD-10-CM

## 2019-03-13 DIAGNOSIS — E1165 Type 2 diabetes mellitus with hyperglycemia: Secondary | ICD-10-CM

## 2019-03-13 DIAGNOSIS — I679 Cerebrovascular disease, unspecified: Secondary | ICD-10-CM

## 2019-03-13 LAB — POCT URINALYSIS DIP (DEVICE)
Bilirubin Urine: NEGATIVE
Glucose, UA: >=1000 mg/dL — AB
Ketones, ur: NEGATIVE mg/dL
Leukocytes,Ua: NEGATIVE
Nitrite: NEGATIVE
Protein, ur: NEGATIVE mg/dL
Specific Gravity, Urine: 1.025 (ref 1.005–1.030)
Urobilinogen, UA: 0.2 mg/dL (ref 0.0–1.0)
pH: 6 (ref 5.0–8.0)

## 2019-03-13 LAB — CBG MONITORING, ED: Glucose-Capillary: 143 mg/dL — ABNORMAL HIGH (ref 70–99)

## 2019-03-13 LAB — GLUCOSE, CAPILLARY: Glucose-Capillary: 143 mg/dL — ABNORMAL HIGH (ref 70–99)

## 2019-03-13 MED ORDER — MECLIZINE HCL 12.5 MG PO TABS: 12.5000 mg | ORAL_TABLET | Freq: Two times a day (BID) | ORAL | 0 refills | Status: DC

## 2019-03-13 NOTE — ED Triage Notes (Signed)
Left ama from ED .  Dizziness comes and goes.  Denies chest pain.  Head feels congested and full

## 2019-03-13 NOTE — ED Provider Notes (Signed)
[place]   MRN: [id number] DOB: 1945-09-05  Subjective:   Linda House is a 74 y.o. female presenting for intermittent dizziness. Has felt head congestion, fullness. Went to the ER on 04/06/2019, had a heat CT, full panel of blood work. CBC was remarkable for mild leukocytosis. Head CT showed chronic small vessel ischemia. Patient ended up leaving AMA as she no longer wanted to wait. She presents today admitting that since then there was a day or two that she felt fine. However, her daughter is from out of town and the patient is worried she will become really dizzy again. The patient's daughter endorses that she seems more like her normal self but does not know any of the results from their ER visit.   No current facility-administered medications for this encounter.  Current Outpatient Medications:  .  amLODipine (NORVASC) 10 MG tablet, Take 1 tablet (10 mg total) by mouth daily., Disp: 30 tablet, Rfl: 6 .  amoxicillin (AMOXIL) 500 MG capsule, Take 1 capsule (500 mg total) by mouth 3 (three) times daily. (Patient not taking: Reported on 08/27/2017), Disp: 30 capsule, Rfl: 0 .  aspirin EC 81 MG tablet, Take 1 tablet (81 mg total) by mouth daily., Disp: 30 tablet, Rfl: 3 .  atorvastatin (LIPITOR) 80 MG tablet, Take 1 tablet (80 mg total) by mouth daily., Disp: 30 tablet, Rfl: 3 .  Blood Glucose Monitoring Suppl (TRUE METRIX METER) w/Device KIT, Use to check blood sugar daily. E11.65, Disp: 1 kit, Rfl: 0 .  cetirizine (ZYRTEC) 10 MG tablet, Take 1 tablet (10 mg total) by mouth daily. (Patient not taking: Reported on 02/03/2019), Disp: 30 tablet, Rfl: 1 .  cyclobenzaprine (FLEXERIL) 10 MG tablet, TAKE 1 TABLET BY MOUTH AT BEDTIME., Disp: 30 tablet, Rfl: 0 .  dapagliflozin propanediol (FARXIGA) 5 MG TABS tablet, Take 5 mg by mouth daily before breakfast. (Patient not taking: Reported on 02/11/2019), Disp: 90 tablet, Rfl: 1 .  gabapentin (NEURONTIN) 300 MG capsule, Take 1  capsule (300 mg total) by mouth at bedtime. (Patient not taking: Reported on 02/11/2019), Disp: 30 capsule, Rfl: 6 .  glipiZIDE (GLIPIZIDE XL) 10 MG 24 hr tablet, TAKE 2 TABLETS (20 MG TOTAL) BY MOUTH DAILY., Disp: 60 tablet, Rfl: 5 .  glucose blood test strip, Use as instructed to check blood sugar daily. E11.65, Disp: 100 each, Rfl: 11 .  levothyroxine (SYNTHROID) 137 MCG tablet, TAKE 1 TABLET (137 MCG TOTAL) BY MOUTH DAILY., Disp: 30 tablet, Rfl: 3 .  lisinopril-hydrochlorothiazide (ZESTORETIC) 20-25 MG tablet, Take 1 tablet by mouth daily., Disp: 30 tablet, Rfl: 6 .  meloxicam (MOBIC) 7.5 MG tablet, TAKE 1 TABLET BY MOUTH DAILY., Disp: 30 tablet, Rfl: 2 .  metFORMIN (GLUCOPHAGE) 1000 MG tablet, Take 1 tablet (1,000 mg total) by mouth 2 (two) times daily with a meal., Disp: 60 tablet, Rfl: 6 .  olopatadine (PATANOL) 0.1 % ophthalmic solution, Place 1 drop into both eyes 2 (two) times daily., Disp: 5 mL, Rfl: 1 .  pioglitazone (ACTOS) 45 MG tablet, Take 1 tablet (45 mg total) by mouth daily., Disp: 30 tablet, Rfl: 6 .  TRUEplus Lancets 28G MISC, Use to check blood sugar daily. E11.65, Disp: 100 each, Rfl: 11   No Known Allergies  Past Medical History:  Diagnosis Date  . Diabetes mellitus   . Diabetes mellitus without complication ([place])   . Hypertension      No past surgical history on file.  Family History  Problem Relation Age of  Onset  . Diabetes Father   . Hypertension Father     Social History   Tobacco Use  . Smoking status: Never Smoker  . Smokeless tobacco: Never Used  Substance Use Topics  . Alcohol use: No  . Drug use: No    ROS   Objective:   Vitals: BP (!) 164/69 (BP Location: Right Arm)   Pulse 83   Temp 98.3 F (36.8 C) (Oral)   Resp 17   SpO2 99%   Physical Exam Constitutional:      General: She is not in acute distress.    Appearance: Normal appearance. She is well-developed. She is not ill-appearing, toxic-appearing or diaphoretic.  HENT:      Head: Normocephalic and atraumatic.     Right Ear: Tympanic membrane and ear canal normal. No drainage or tenderness. No middle ear effusion. Tympanic membrane is not erythematous.     Left Ear: Tympanic membrane and ear canal normal. No drainage or tenderness.  No middle ear effusion. Tympanic membrane is not erythematous.     Nose: Nose normal. No congestion or rhinorrhea.     Mouth/Throat:     Mouth: Mucous membranes are moist. No oral lesions.     Pharynx: Oropharynx is clear. No pharyngeal swelling, oropharyngeal exudate, posterior oropharyngeal erythema or uvula swelling.     Tonsils: No tonsillar exudate or tonsillar abscesses.  Eyes:     Extraocular Movements: Extraocular movements intact.     Right eye: Normal extraocular motion.     Left eye: Normal extraocular motion.     Conjunctiva/sclera: Conjunctivae normal.     Pupils: Pupils are equal, round, and reactive to light.  Cardiovascular:     Rate and Rhythm: Normal rate and regular rhythm.     Pulses: Normal pulses.     Heart sounds: Normal heart sounds. No murmur. No friction rub. No gallop.   Pulmonary:     Effort: Pulmonary effort is normal. No respiratory distress.     Breath sounds: Normal breath sounds. No stridor. No wheezing, rhonchi or rales.  Musculoskeletal:     Cervical back: Normal range of motion and neck supple.  Lymphadenopathy:     Cervical: No cervical adenopathy.  Skin:    General: Skin is warm and dry.     Findings: No rash.  Neurological:     General: No focal deficit present.     Mental Status: She is alert and oriented to person, place, and time.     Cranial Nerves: No cranial nerve deficit.     Motor: No weakness.     Coordination: Coordination normal.     Gait: Gait normal.     Deep Tendon Reflexes: Reflexes normal.  Psychiatric:        Mood and Affect: Mood normal.        Behavior: Behavior normal.        Thought Content: Thought content normal.        Judgment: Judgment normal.     CT  HEAD WO CONTRAST  Result Date: 03/09/2019 CLINICAL DATA:  Neuro deficit, question of stroke EXAM: CT HEAD WITHOUT CONTRAST TECHNIQUE: Contiguous axial images were obtained from the base of the skull through the vertex without intravenous contrast. COMPARISON:  None. FINDINGS: Brain: No evidence of acute territorial infarction, hemorrhage, hydrocephalus,extra-axial collection or mass lesion/mass effect. There is dilatation the ventricles and sulci consistent with age-related atrophy. Low-attenuation changes in the deep white matter consistent with small vessel ischemia. Vascular: No hyperdense vessel or unexpected  calcification. Skull: The skull is intact. No fracture or focal lesion identified. Sinuses/Orbits: The visualized paranasal sinuses and mastoid air cells are clear. The orbits and globes intact. Other: None IMPRESSION: No acute intracranial abnormality. Findings consistent with age related atrophy and chronic small vessel ischemia Electronically Signed   By: Prudencio Pair M.D.   On: 03/09/2019 20:40     Recent Results (from the past 2160 hour(s))  POCT glucose (manual entry)     Status: Abnormal   Collection Time: 02/03/19  9:35 AM  Result Value Ref Range   POC Glucose 132 (A) 70 - 99 mg/dl  POCT glycosylated hemoglobin (Hb A1C)     Status: Abnormal   Collection Time: 02/03/19  9:42 AM  Result Value Ref Range   Hemoglobin A1C     HbA1c POC (<> result, manual entry)     HbA1c, POC (prediabetic range)     HbA1c, POC (controlled diabetic range) 10.4 (A) 0.0 - 7.0 %  TSH     Status: None   Collection Time: 02/03/19 10:35 AM  Result Value Ref Range   TSH 1.910 0.450 - 4.500 uIU/mL  CMP14+EGFR     Status: Abnormal   Collection Time: 02/03/19 10:35 AM  Result Value Ref Range   Glucose 127 (H) 65 - 99 mg/dL   BUN 17 8 - 27 mg/dL   Creatinine, Ser 0.71 0.57 - 1.00 mg/dL   GFR calc non Af Amer 85 >59 mL/min/1.73   GFR calc Af Amer 98 >59 mL/min/1.73   BUN/Creatinine Ratio 24 12 - 28    Sodium 138 134 - 144 mmol/L   Potassium 4.3 3.5 - 5.2 mmol/L   Chloride 99 96 - 106 mmol/L   CO2 25 20 - 29 mmol/L   Calcium 9.8 8.7 - 10.3 mg/dL   Total Protein 7.7 6.0 - 8.5 g/dL   Albumin 4.4 3.7 - 4.7 g/dL   Globulin, Total 3.3 1.5 - 4.5 g/dL   Albumin/Globulin Ratio 1.3 1.2 - 2.2   Bilirubin Total 0.5 0.0 - 1.2 mg/dL   Alkaline Phosphatase 183 (H) 39 - 117 IU/L   AST 20 0 - 40 IU/L   ALT 24 0 - 32 IU/L  Lipid panel     Status: Abnormal   Collection Time: 02/03/19 10:35 AM  Result Value Ref Range   Cholesterol, Total 239 (H) 100 - 199 mg/dL   Triglycerides 192 (H) 0 - 149 mg/dL   HDL 56 >39 mg/dL   VLDL Cholesterol Cal 35 5 - 40 mg/dL   LDL Chol Calc (NIH) 148 (H) 0 - 99 mg/dL   Chol/HDL Ratio 4.3 0.0 - 4.4 ratio    Comment:                                   T. Chol/HDL Ratio                                             Men  Women                               1/2 Avg.Risk  3.4    3.3  Avg.Risk  5.0    4.4                                2X Avg.Risk  9.6    7.1                                3X Avg.Risk 23.4   11.0   Microalbumin / creatinine urine ratio     Status: None   Collection Time: 02/03/19 10:35 AM  Result Value Ref Range   Creatinine, Urine 57.5 Not Estab. mg/dL   Microalbumin, Urine 5.4 Not Estab. ug/mL   Microalb/Creat Ratio 9 0 - 29 mg/g creat    Comment:                        Normal:                0 -  29                        Moderately increased: 30 - 300                        Severely increased:       >300   Gamma GT     Status: None   Collection Time: 02/03/19 10:35 AM  Result Value Ref Range   GGT 46 0 - 60 IU/L  T4, free     Status: None   Collection Time: 02/03/19 10:35 AM  Result Value Ref Range   Free T4 1.41 0.82 - 1.77 ng/dL  Protime-INR     Status: None   Collection Time: 03/09/19  7:51 PM  Result Value Ref Range   Prothrombin Time 11.8 11.4 - 15.2 seconds   INR 0.9 0.8 - 1.2    Comment: (NOTE) INR  goal varies based on device and disease states. Performed at [facility], [place] [address], [place], [place] [address]   APTT     Status: None   Collection Time: 03/09/19  7:51 PM  Result Value Ref Range   aPTT 30 24 - 36 seconds    Comment: Performed at [place] [place] [address] [place], [place] [address]  CBC     Status: Abnormal   Collection Time: 03/09/19  7:51 PM  Result Value Ref Range   WBC 13.4 (H) 4.0 - 10.5 K/uL   RBC 5.30 (H) 3.87 - 5.11 MIL/uL   Hemoglobin 13.6 12.0 - 15.0 g/dL   HCT 43.4 36.0 - 46.0 %   MCV 81.9 80.0 - 100.0 fL   MCH 25.7 (L) 26.0 - 34.0 pg   MCHC 31.3 30.0 - 36.0 g/dL   RDW 14.5 11.5 - 15.5 %   Platelets 384 150 - 400 K/uL   nRBC 0.0 0.0 - 0.2 %    Comment: Performed at [facility], [place] [address] [place], [place] [address]  Differential     Status: Abnormal   Collection Time: 03/09/19  7:51 PM  Result Value Ref Range   Neutrophils Relative % 42 %   Neutro Abs 5.6 1.7 - 7.7 K/uL   Lymphocytes Relative 49 %   Lymphs Abs 6.4 (H) 0.7 - 4.0 K/uL   Monocytes Relative 6 %   Monocytes Absolute 0.7 0.1 -  1.0 K/uL   Eosinophils Relative 3 %   Eosinophils Absolute 0.3 0.0 - 0.5 K/uL   Basophils Relative 0 %   Basophils Absolute 0.0 0.0 - 0.1 K/uL   Immature Granulocytes 0 %   Abs Immature Granulocytes 0.03 0.00 - 0.07 K/uL    Comment: Performed at [facility], [place] [address] [place], [place] [address]  Comprehensive metabolic panel     Status: Abnormal   Collection Time: 03/09/19  7:51 PM  Result Value Ref Range   Sodium 136 135 - 145 mmol/L   Potassium 3.3 (L) 3.5 - 5.1 mmol/L   Chloride 103 98 - 111 mmol/L   CO2 23 22 - 32 mmol/L   Glucose, Bld 70 70 - 99 mg/dL    Comment: Glucose reference range applies only to samples taken after fasting for at least 8 hours.   BUN 31 (H) 8 - 23 mg/dL   Creatinine, Ser 0.84 0.44 - 1.00 mg/dL   Calcium 9.5 8.9 - 10.3 mg/dL   Total Protein 7.5 6.5 - 8.1 g/dL   Albumin 3.9 3.5 -  5.0 g/dL   AST 21 15 - 41 U/L   ALT 21 0 - 44 U/L   Alkaline Phosphatase 126 38 - 126 U/L   Total Bilirubin 0.3 0.3 - 1.2 mg/dL   GFR calc non Af Amer >60 >60 mL/min   GFR calc Af Amer >60 >60 mL/min   Anion gap 10 5 - 15    Comment: Performed at [facility], [place] [address], [place], [place] [address]  POCT urinalysis dip (device)     Status: Abnormal   Collection Time: 03/13/19  3:00 PM  Result Value Ref Range   Glucose, UA >=1000 (A) NEGATIVE mg/dL   Bilirubin Urine NEGATIVE NEGATIVE   Ketones, ur NEGATIVE NEGATIVE mg/dL   Specific Gravity, Urine 1.025 1.005 - 1.030   Hgb urine dipstick TRACE (A) NEGATIVE   pH 6.0 5.0 - 8.0   Protein, ur NEGATIVE NEGATIVE mg/dL   Urobilinogen, UA 0.2 0.0 - 1.0 mg/dL   Nitrite NEGATIVE NEGATIVE   Leukocytes,Ua NEGATIVE NEGATIVE    Comment: Biochemical Testing Only. Please order routine urinalysis from main lab if confirmatory testing is needed.  POC CBG monitoring     Status: Abnormal   Collection Time: 03/13/19  3:44 PM  Result Value Ref Range   Glucose-Capillary 143 (H) 70 - 99 mg/dL    Comment: Glucose reference range applies only to samples taken after fasting for at least 8 hours.  Glucose, capillary     Status: Abnormal   Collection Time: 03/13/19  3:44 PM  Result Value Ref Range   Glucose-Capillary 143 (H) 70 - 99 mg/dL    Comment: Glucose reference range applies only to samples taken after fasting for at least 8 hours.     Assessment and Plan :   1. Dizziness   2. Cerebrovascular disease   3. Small vessel disease, cerebrovascular   4. Hyperglycemia   5. Uncontrolled type 2 diabetes mellitus with hyperglycemia ([place])     Patient has mildly elevated leukocytosis which is nonspecific given lack of physical exam findings.  Urine culture is pending.  Did extensive review with patient and her daughter regarding her recent work-up including a head CT.  Will use conservative management with meclizine for symptomatic relief of her  dizziness.  Patient did schedule an appointment for an urgent check with her regular care office on March 29, 2019.  At  this time I believe patient is okay to keep that appointment.  Counseled that if she develops weakness, severe headache, chest pain to report back to the emergency room as this could be sign of a cardiovascular or cerebrovascular event. Counseled patient on potential for adverse effects with medications prescribed/recommended today, ER and return-to-clinic precautions discussed, patient verbalized understanding.    Jaynee Eagles, [place] 03/13/19 1639

## 2019-03-13 NOTE — Discharge Instructions (Signed)
Por favor coma comidas que pueden aumentar su potasio en su dieta. Frutos secos, como cacahuetes y Research officer, trade union. Semillas, como semillas de girasol y de Scientific laboratory technician. Porotos, guisantes secos y lentejas. Granos enteros y panes y cereales con salvado. [place] y verduras frescas, como damascos, palta, bananas, meln, kiwi, naranjas, tomates, esprragos y papas. Jugo de naranjas. Jugo de tomate. Carnes rojas. Yogur.

## 2019-03-13 NOTE — ED Triage Notes (Signed)
Patient is reported as acting normal, speaking normal.    Daughter says patient reportedly was asking questions that seemed confused on the day that she went to ED.

## 2019-03-14 LAB — URINE CULTURE: Culture: NO GROWTH

## 2019-03-19 MED FILL — metFORMIN HCL 1000 MG TABS: 1000 | 30 days supply | Qty: 60 | Fill #1

## 2019-03-19 MED FILL — AMLODIPINE BESYLATE 10 MG T: 10 | 30 days supply | Qty: 30 | Fill #1

## 2019-03-29 ENCOUNTER — Other Ambulatory Visit: Payer: Self-pay

## 2019-03-29 ENCOUNTER — Encounter: Payer: Self-pay | Admitting: Family Medicine

## 2019-03-29 ENCOUNTER — Ambulatory Visit: Payer: Self-pay | Attending: Family Medicine | Admitting: Family Medicine

## 2019-03-29 VITALS — BP 119/67 | HR 75 | Ht 60.0 in | Wt 156.8 lb

## 2019-03-29 DIAGNOSIS — R42 Dizziness and giddiness: Secondary | ICD-10-CM

## 2019-03-29 DIAGNOSIS — E1165 Type 2 diabetes mellitus with hyperglycemia: Secondary | ICD-10-CM

## 2019-03-29 DIAGNOSIS — Z794 Long term (current) use of insulin: Secondary | ICD-10-CM

## 2019-03-29 LAB — GLUCOSE, POCT (MANUAL RESULT ENTRY): POC Glucose: 92 mg/dl (ref 70–99)

## 2019-03-29 NOTE — Progress Notes (Signed)
Subjective:  Patient ID: Linda House, female    DOB: 09/29/1945  Age: 74 y.o. MRN: [id number]  CC: Hospitalization Follow-up   HPI Linda House  is a 74 year old female with a history of uncontrolled type 2 diabetes mellitus (A1c 9.8), hypothyroidism, hyperlipidemia, hypertension whois seen for follow-up from an ED visit. She has not been checking her sugars as she gets stressed when her sugars are high or low.  Seen at the ED for dizziness on 03/13/2019 and she states dizziness has resolved at this time. Labs were negative for anemia. CT head revealed: IMPRESSION: No acute intracranial abnormality. Findings consistent with age related atrophy and chronic small vessel ischemia.  She was prescribed meclizine and subsequently discharged.  She does have intermittent knee pain from time to time but that is not bothersome. Her appointment for chronic disease management comes up next month.  Past Medical History:  Diagnosis Date  . Diabetes mellitus   . Diabetes mellitus without complication ([place])   . Hypertension     No past surgical history on file.  Family History  Problem Relation Age of Onset  . Diabetes Father   . Hypertension Father     No Known Allergies  Outpatient Medications Prior to Visit  Medication Sig Dispense Refill  . amLODipine (NORVASC) 10 MG tablet Take 1 tablet (10 mg total) by mouth daily. 30 tablet 6  . aspirin EC 81 MG tablet Take 1 tablet (81 mg total) by mouth daily. 30 tablet 3  . atorvastatin (LIPITOR) 80 MG tablet Take 1 tablet (80 mg total) by mouth daily. 30 tablet 3  . Blood Glucose Monitoring Suppl (TRUE METRIX METER) w/Device KIT Use to check blood sugar daily. E11.65 1 kit 0  . cyclobenzaprine (FLEXERIL) 10 MG tablet TAKE 1 TABLET BY MOUTH AT BEDTIME. 30 tablet 0  . dapagliflozin propanediol (FARXIGA) 5 MG TABS tablet Take 5 mg by mouth daily before breakfast. 90 tablet 1  . glipiZIDE (GLIPIZIDE XL) 10 MG 24 hr  tablet TAKE 2 TABLETS (20 MG TOTAL) BY MOUTH DAILY. 60 tablet 5  . glucose blood test strip Use as instructed to check blood sugar daily. E11.65 100 each 11  . levothyroxine (SYNTHROID) 137 MCG tablet TAKE 1 TABLET (137 MCG TOTAL) BY MOUTH DAILY. 30 tablet 3  . lisinopril-hydrochlorothiazide (ZESTORETIC) 20-25 MG tablet Take 1 tablet by mouth daily. 30 tablet 6  . meclizine (ANTIVERT) 12.5 MG tablet Take 1 tablet (12.5 mg total) by mouth 2 (two) times daily. 30 tablet 0  . meloxicam (MOBIC) 7.5 MG tablet TAKE 1 TABLET BY MOUTH DAILY. 30 tablet 2  . metFORMIN (GLUCOPHAGE) 1000 MG tablet Take 1 tablet (1,000 mg total) by mouth 2 (two) times daily with a meal. 60 tablet 6  . olopatadine (PATANOL) 0.1 % ophthalmic solution Place 1 drop into both eyes 2 (two) times daily. 5 mL 1  . pioglitazone (ACTOS) 45 MG tablet Take 1 tablet (45 mg total) by mouth daily. 30 tablet 6  . TRUEplus Lancets 28G MISC Use to check blood sugar daily. E11.65 100 each 11  . amoxicillin (AMOXIL) 500 MG capsule Take 1 capsule (500 mg total) by mouth 3 (three) times daily. (Patient not taking: Reported on 08/27/2017) 30 capsule 0  . cetirizine (ZYRTEC) 10 MG tablet Take 1 tablet (10 mg total) by mouth daily. (Patient not taking: Reported on 02/03/2019) 30 tablet 1  . gabapentin (NEURONTIN) 300 MG capsule Take 1 capsule (300 mg total) by mouth at bedtime. (  Patient not taking: Reported on 02/11/2019) 30 capsule 6   No facility-administered medications prior to visit.     ROS Review of Systems  Constitutional: Negative for activity change, appetite change and fatigue.  HENT: Negative for congestion, sinus pressure and sore throat.   Eyes: Negative for visual disturbance.  Respiratory: Negative for cough, chest tightness, shortness of breath and wheezing.   Cardiovascular: Negative for chest pain and palpitations.  Gastrointestinal: Negative for abdominal distention, abdominal pain and constipation.  Endocrine: Negative for  polydipsia.  Genitourinary: Negative for dysuria and frequency.  Musculoskeletal: Negative for arthralgias and back pain.  Skin: Negative for rash.  Neurological: Negative for tremors, light-headedness and numbness.  Hematological: Does not bruise/bleed easily.  Psychiatric/Behavioral: Negative for agitation and behavioral problems.    Objective:  BP 119/67   Pulse 75   Ht 5' (1.524 m)   Wt 156 lb 12.8 oz (71.1 kg)   SpO2 98%   BMI 30.62 kg/m   BP/Weight 03/29/2019 03/13/2019 1/74/0814  Systolic BP 481 856 314  Diastolic BP 67 69 55  Wt. (Lbs) 156.8 - 153  BMI 30.62 - 29.88      Physical Exam  CMP Latest Ref Rng & Units 03/09/2019 02/03/2019 10/14/2018  Glucose 70 - 99 mg/dL 70 127(H) 248(H)  BUN 8 - 23 mg/dL 31(H) 17 13  Creatinine 0.44 - 1.00 mg/dL 0.84 0.71 0.90  Sodium 135 - 145 mmol/L 136 138 135  Potassium 3.5 - 5.1 mmol/L 3.3(L) 4.3 4.2  Chloride 98 - 111 mmol/L 103 99 96  CO2 22 - 32 mmol/L '23 25 24  ' Calcium 8.9 - 10.3 mg/dL 9.5 9.8 9.8  Total Protein 6.5 - 8.1 g/dL 7.5 7.7 7.6  Total Bilirubin 0.3 - 1.2 mg/dL 0.3 0.5 0.4  Alkaline Phos 38 - 126 U/L 126 183(H) 191(H)  AST 15 - 41 U/L '21 20 14  ' ALT 0 - 44 U/L '21 24 17    ' Lipid Panel     Component Value Date/Time   CHOL 239 (H) 02/03/2019 1035   TRIG 192 (H) 02/03/2019 1035   HDL 56 02/03/2019 1035   CHOLHDL 4.3 02/03/2019 1035   CHOLHDL 3.4 02/19/2016 1021   VLDL 25 08/29/2015 0849   LDLCALC 148 (H) 02/03/2019 1035    CBC    Component Value Date/Time   WBC 13.4 (H) 03/09/2019 1951   RBC 5.30 (H) 03/09/2019 1951   HGB 13.6 03/09/2019 1951   HGB 12.2 08/27/2016 0919   HCT 43.4 03/09/2019 1951   HCT 38.2 08/27/2016 0919   PLT 384 03/09/2019 1951   PLT 355 08/27/2016 0919   MCV 81.9 03/09/2019 1951   MCV 79 08/27/2016 0919   MCH 25.7 (L) 03/09/2019 1951   MCHC 31.3 03/09/2019 1951   RDW 14.5 03/09/2019 1951   RDW 15.2 08/27/2016 0919   LYMPHSABS 6.4 (H) 03/09/2019 1951   LYMPHSABS 2.5  08/27/2016 0919   MONOABS 0.7 03/09/2019 1951   EOSABS 0.3 03/09/2019 1951   EOSABS 0.2 08/27/2016 0919   BASOSABS 0.0 03/09/2019 1951   BASOSABS 0.0 08/27/2016 0919    Lab Results  Component Value Date   HGBA1C 10.4 (A) 02/03/2019    Assessment & Plan:  1. Type 2 diabetes mellitus with hyperglycemia, with long-term current use of insulin (HCC) Uncontrolled with A1c of 10.4; goal is less than 7.0 A1c is due next month Continue current regimen Keep appointment for chronic disease management Counseled on Diabetic diet, my plate method, 970 minutes of  moderate intensity exercise/week Blood sugar logs with fasting goals of 80-120 mg/dl, random of less than 180 and in the event of sugars less than 60 mg/dl or greater than 400 mg/dl encouraged to notify the clinic. Advised on the need for annual eye exams, annual foot exams, Pneumonia vaccine. - POCT glucose (manual entry)  2. Vertigo Resolved She does have a prescription for meclizine to use as needed  Return for Medical conditions, keep previously scheduled appointment.       Charlott Rakes, MD, FAAFP. [facility] and [place], [place]   03/29/2019, 10:35 AM

## 2019-03-29 NOTE — Patient Instructions (Signed)
Linda House Dizziness [name] son un problema muy frecuente. Causan sensacin de inestabilidad o de desvanecimiento. Puede sentir que se va a desmayar. [employer] pueden provocarle una lesin si se tropieza o se cae. La causa puede deberse a [employer], tales como los siguientes:  Medicamentos.  No tener suficiente agua en el cuerpo (deshidratacin).  Enfermedad. Siga estas indicaciones en su casa: Comida y bebida   Beba suficiente lquido para mantener el pis (orina) claro o de color amarillo plido. Esto evita la deshidratacin. Trate de beber ms lquidos transparentes, como agua.  No beba alcohol.  Limite la cantidad de cafena que bebe o come si el mdico se lo indica.  Limite la cantidad de sal (sodio) que bebe o come si el mdico se lo indica. Actividad   Evite los movimientos rpidos. ? Cuando se levante de una silla, sujtese hasta sentirse bien. ? Por la maana, sintese primero a un lado de la cama. Cuando se sienta bien, pngase lentamente de pie mientras se sostiene de algo. Haga esto hasta que se sienta seguro en cuanto al equilibrio.  Mueva las piernas con frecuencia si debe estar de pie en un lugar durante mucho tiempo. Mientras est de pie, contraiga y relaje los msculos de las piernas.  No conduzca vehculos ni opere maquinaria pesada si se siente mareado.  Evite agacharse si se siente mareado. En su casa, coloque los objetos en algn lugar que le resulte fcil alcanzarlos sin agacharse. Estilo de vida  No consuma ningn producto que contenga nicotina o tabaco, como cigarrillos y Psychologist, sport and exercise. Si necesita ayuda para dejar de fumar, consulte al mdico.  Intente bajar el nivel de estrs. Para hacerlo, puede usar mtodos como el yoga o la meditacin. Hable con el mdico si necesita ayuda. Instrucciones generales  Controle [name].  Tome los medicamentos de venta libre y los recetados solamente como se lo haya indicado  el mdico. Hable con el mdico si cree que la causa de [name] es algn medicamento que est tomando.  Infrmele a un amigo o a un familiar si se siente mareado. Pdale a esta persona que llame al mdico si observa cambios en su comportamiento.  Concurra a todas las visitas de control como se lo haya indicado el mdico. Esto es importante. Comunquese con un mdico si:  Los [employer].  [employer] o la sensacin de Engineer, petroleum.  Siente malestar estomacal (nuseas).  Tiene problemas para escuchar.  Aparecen nuevos sntomas.  Siente inestabilidad al estar de pie.  Siente que [name], international aid vueltas. Solicite ayuda de inmediato si:  Vomita o tiene heces acuosas (diarrea), y no puede comer o beber nada.  Tiene dificultad para hacer lo siguiente: ? Hablar. ? Caminar. ? [place]. ? Usar los brazos, las [place] piernas.  Se siente constantemente dbil.  No piensa con claridad o tiene dificultad para armar oraciones. Es posible que un amigo o un familiar adviertan que esto ocurre.  Tiene los siguientes sntomas: ? Tourist information centre manager. ? Dolor en el vientre (abdomen). ? Falta de aire. ? Sudoracin.  Cambios en la visin.  Sangrado.  Dolor de cabeza muy intenso.  Dolor o rigidez en el cuello.  Cristy Hilts. Estos sntomas pueden Sales executive. No espere hasta que los sntomas desaparezcan. Solicite atencin mdica de inmediato. Comunquese con el servicio de emergencias de su localidad (911 en los [place]). No conduzca por sus propios [place]  Linda House causan sensacin de inestabilidad o de desvanecimiento. Puede sentir que se va a desmayar.  Beba suficiente lquido para mantener el pis (orina) claro o de color amarillo plido. No beba alcohol.  Evite los movimientos rpidos si se siente mareado.  Controle [name]. Esta informacin no tiene Marine scientist el consejo del  mdico. Asegrese de hacerle al mdico cualquier pregunta que tenga. Document Revised: 07/04/2016 Document Reviewed: 07/04/2016 Elsevier Patient Education  [place].

## 2019-04-08 MED FILL — metFORMIN HCL 1000 MG TABS: 1000 | 30 days supply | Qty: 60 | Fill #2

## 2019-04-08 MED FILL — PIOGLITAZONE HCL 45 MG TABS: 45 | 30 days supply | Qty: 30 | Fill #2

## 2019-04-08 MED FILL — LISINOPRIL-HCTZ 20-25 MG TA: 20-25 | 30 days supply | Qty: 30 | Fill #2

## 2019-04-08 MED FILL — $FARXIGA 5 MG TABLET: 5 | 90 days supply | Qty: 90 | Fill #2

## 2019-04-08 MED FILL — AMLODIPINE BESYLATE 10 MG T: 10 | 30 days supply | Qty: 30 | Fill #2

## 2019-04-08 MED FILL — LEVOTHYROXINE 137 MCG TAB: 137 | 30 days supply | Qty: 30 | Fill #2

## 2019-04-08 MED FILL — glipiZIDE XL 10 MG TB24: 10 | 30 days supply | Qty: 60 | Fill #2

## 2019-04-08 MED FILL — ATORVASTATIN 80 MG TABLET: 80 | 30 days supply | Qty: 30 | Fill #2

## 2019-05-04 ENCOUNTER — Ambulatory Visit: Payer: Self-pay | Attending: Family Medicine | Admitting: Family Medicine

## 2019-05-04 ENCOUNTER — Other Ambulatory Visit: Payer: Self-pay

## 2019-05-04 ENCOUNTER — Encounter: Payer: Self-pay | Admitting: Family Medicine

## 2019-05-04 VITALS — BP 108/63 | HR 76 | Ht 60.0 in | Wt 158.0 lb

## 2019-05-04 DIAGNOSIS — Z794 Long term (current) use of insulin: Secondary | ICD-10-CM

## 2019-05-04 DIAGNOSIS — I10 Essential (primary) hypertension: Secondary | ICD-10-CM

## 2019-05-04 DIAGNOSIS — E7800 Pure hypercholesterolemia, unspecified: Secondary | ICD-10-CM

## 2019-05-04 DIAGNOSIS — M79672 Pain in left foot: Secondary | ICD-10-CM

## 2019-05-04 DIAGNOSIS — E039 Hypothyroidism, unspecified: Secondary | ICD-10-CM

## 2019-05-04 DIAGNOSIS — E1165 Type 2 diabetes mellitus with hyperglycemia: Secondary | ICD-10-CM

## 2019-05-04 DIAGNOSIS — H268 Other specified cataract: Secondary | ICD-10-CM

## 2019-05-04 DIAGNOSIS — E1149 Type 2 diabetes mellitus with other diabetic neurological complication: Secondary | ICD-10-CM

## 2019-05-04 DIAGNOSIS — H11003 Unspecified pterygium of eye, bilateral: Secondary | ICD-10-CM

## 2019-05-04 LAB — GLUCOSE, POCT (MANUAL RESULT ENTRY): POC Glucose: 99 mg/dl (ref 70–99)

## 2019-05-04 MED ORDER — LISINOPRIL-HYDROCHLOROTHIAZIDE 20-25 MG PO TABS: 1.0000 | ORAL_TABLET | Freq: Every day | ORAL | 6 refills | Status: DC

## 2019-05-04 MED ORDER — ATORVASTATIN CALCIUM 80 MG PO TABS: 80.0000 mg | ORAL_TABLET | Freq: Every day | ORAL | 6 refills | Status: DC

## 2019-05-04 MED ORDER — OLOPATADINE HCL 0.1 % OP SOLN: 1.0000 [drp] | Freq: Two times a day (BID) | OPHTHALMIC | 1 refills | Status: DC

## 2019-05-04 MED ORDER — DICLOFENAC SODIUM 1 % EX GEL: 4.0000 g | Freq: Four times a day (QID) | CUTANEOUS | 1 refills | Status: DC

## 2019-05-04 MED ORDER — GLIPIZIDE ER 10 MG PO TB24: ORAL_TABLET | ORAL | 6 refills | Status: DC

## 2019-05-04 MED ORDER — AMLODIPINE BESYLATE 10 MG PO TABS: 10.0000 mg | ORAL_TABLET | Freq: Every day | ORAL | 6 refills | Status: DC

## 2019-05-04 MED FILL — glipiZIDE XL 10 MG TB24: 10 | 30 days supply | Qty: 60 | Fill #3

## 2019-05-04 MED FILL — AMLODIPINE BESYLATE 10 MG T: 10 | 30 days supply | Qty: 30 | Fill #3

## 2019-05-04 MED FILL — OLOPATADINE HCL 0.1 % SOLN: 0.1 | 18 days supply | Qty: 5 | Fill #0

## 2019-05-04 MED FILL — ATORVASTATIN 80 MG TABLET: 80 | 30 days supply | Qty: 30 | Fill #3

## 2019-05-04 MED FILL — DICLOFENAC SODIUM 1% GEL: 1 | 6 days supply | Qty: 100 | Fill #0

## 2019-05-04 MED FILL — LISINOPRIL-HCTZ 20-25 MG TA: 20-25 | 30 days supply | Qty: 30 | Fill #3

## 2019-05-04 NOTE — Progress Notes (Signed)
Subjective:  Patient ID: Linda House, female    DOB: [birth year]  Age: 74 y.o. MRN: [id number]  CC: Diabetes   HPI Linda House is a 74 year old female with a history of uncontrolled type 2 diabetes mellitus (A1c9.8), hypothyroidism, hyperlipidemia, hypertension   Does not check her blood sugars at home as Linda House is usually busy caring for her grandkids and Linda House denies hypoglycemic symptoms. Denies presence of numbness. For the last month Linda House has felt like Linda House has dirt in her left eye with associated blurry vision. Linda House also has tearing of her L eye but no itching; right eye symptoms are presence but slight. Her vision is slightly blurry. Left medial ankle also hurts to the point that Linda House cannot walk and symptoms have been present for months and is worse in the morning. Rated as an 8/10.  Compliant with antihypertensive, statin and levothyroxine for hypothyroidism.  Denies adverse effects from medication Past Medical History:  Diagnosis Date  . Diabetes mellitus   . Diabetes mellitus without complication ([place])   . Hypertension     No past surgical history on file.  Family History  Problem Relation Age of Onset  . Diabetes Father   . Hypertension Father     No Known Allergies  Outpatient Medications Prior to Visit  Medication Sig Dispense Refill  . amLODipine (NORVASC) 10 MG tablet Take 1 tablet (10 mg total) by mouth daily. 30 tablet 6  . aspirin EC 81 MG tablet Take 1 tablet (81 mg total) by mouth daily. 30 tablet 3  . atorvastatin (LIPITOR) 80 MG tablet Take 1 tablet (80 mg total) by mouth daily. 30 tablet 3  . Blood Glucose Monitoring Suppl (TRUE METRIX METER) w/Device KIT Use to check blood sugar daily. E11.65 1 kit 0  . cyclobenzaprine (FLEXERIL) 10 MG tablet TAKE 1 TABLET BY MOUTH AT BEDTIME. 30 tablet 0  . dapagliflozin propanediol (FARXIGA) 5 MG TABS tablet Take 5 mg by mouth daily before breakfast. 90 tablet 1  . glipiZIDE (GLIPIZIDE XL) 10  MG 24 hr tablet TAKE 2 TABLETS (20 MG TOTAL) BY MOUTH DAILY. 60 tablet 5  . glucose blood test strip Use as instructed to check blood sugar daily. E11.65 100 each 11  . levothyroxine (SYNTHROID) 137 MCG tablet TAKE 1 TABLET (137 MCG TOTAL) BY MOUTH DAILY. 30 tablet 3  . lisinopril-hydrochlorothiazide (ZESTORETIC) 20-25 MG tablet Take 1 tablet by mouth daily. 30 tablet 6  . metFORMIN (GLUCOPHAGE) 1000 MG tablet Take 1 tablet (1,000 mg total) by mouth 2 (two) times daily with a meal. 60 tablet 6  . pioglitazone (ACTOS) 45 MG tablet Take 1 tablet (45 mg total) by mouth daily. 30 tablet 6  . TRUEplus Lancets 28G MISC Use to check blood sugar daily. E11.65 100 each 11  . amoxicillin (AMOXIL) 500 MG capsule Take 1 capsule (500 mg total) by mouth 3 (three) times daily. (Patient not taking: Reported on 08/27/2017) 30 capsule 0  . cetirizine (ZYRTEC) 10 MG tablet Take 1 tablet (10 mg total) by mouth daily. (Patient not taking: Reported on 02/03/2019) 30 tablet 1  . gabapentin (NEURONTIN) 300 MG capsule Take 1 capsule (300 mg total) by mouth at bedtime. (Patient not taking: Reported on 02/11/2019) 30 capsule 6  . meclizine (ANTIVERT) 12.5 MG tablet Take 1 tablet (12.5 mg total) by mouth 2 (two) times daily. (Patient not taking: Reported on 05/04/2019) 30 tablet 0  . meloxicam (MOBIC) 7.5 MG tablet TAKE 1 TABLET BY MOUTH DAILY. (Patient  not taking: Reported on 05/04/2019) 30 tablet 2  . olopatadine (PATANOL) 0.1 % ophthalmic solution Place 1 drop into both eyes 2 (two) times daily. (Patient not taking: Reported on 05/04/2019) 5 mL 1   No facility-administered medications prior to visit.     ROS Review of Systems  Constitutional: Negative for activity change, appetite change and fatigue.  HENT: Negative for congestion, sinus pressure and sore throat.   Eyes: Positive for visual disturbance.  Respiratory: Negative for cough, chest tightness, shortness of breath and wheezing.   Cardiovascular: Negative for chest  pain and palpitations.  Gastrointestinal: Negative for abdominal distention, abdominal pain and constipation.  Endocrine: Negative for polydipsia.  Genitourinary: Negative for dysuria and frequency.  Musculoskeletal: Negative for arthralgias and back pain.  Skin: Negative for rash.  Neurological: Negative for tremors, light-headedness and numbness.  Hematological: Does not bruise/bleed easily.  Psychiatric/Behavioral: Negative for agitation and behavioral problems.    Objective:  BP 108/63   Pulse 76   Ht 5' (1.524 m)   Wt 158 lb (71.7 kg)   SpO2 97%   BMI 30.86 kg/m   BP/Weight 05/04/2019 03/29/2019 7/79/3968  Systolic BP 864 847 207  Diastolic BP 63 67 69  Wt. (Lbs) 158 156.8 -  BMI 30.86 30.62 -      Physical Exam Constitutional:      Appearance: Linda House is well-developed.  Eyes:     Extraocular Movements:     Right eye: Normal extraocular motion.     Left eye: Normal extraocular motion.     Pupils: Pupils are equal, round, and reactive to light.     Comments: Pterygium at 9 o'clock on R eye and 3 o'clock on left eye Right eye with normal disc; L eye with cataract  Neck:     Vascular: No JVD.  Cardiovascular:     Rate and Rhythm: Normal rate.     Heart sounds: Normal heart sounds. No murmur.  Pulmonary:     Effort: Pulmonary effort is normal.     Breath sounds: Normal breath sounds. No wheezing or rales.  Chest:     Chest wall: No tenderness.  Abdominal:     General: Bowel sounds are normal. There is no distension.     Palpations: Abdomen is soft. There is no mass.     Tenderness: There is no abdominal tenderness.  Musculoskeletal:        General: Normal range of motion.     Right lower leg: No edema.     Left lower leg: No edema.  Neurological:     Mental Status: Linda House is alert and oriented to person, place, and time.  Psychiatric:        Mood and Affect: Mood normal.     CMP Latest Ref Rng & Units 03/09/2019 02/03/2019 10/14/2018  Glucose 70 - 99 mg/dL 70  127(H) 248(H)  BUN 8 - 23 mg/dL 31(H) 17 13  Creatinine 0.44 - 1.00 mg/dL 0.84 0.71 0.90  Sodium 135 - 145 mmol/L 136 138 135  Potassium 3.5 - 5.1 mmol/L 3.3(L) 4.3 4.2  Chloride 98 - 111 mmol/L 103 99 96  CO2 22 - 32 mmol/L '23 25 24  ' Calcium 8.9 - 10.3 mg/dL 9.5 9.8 9.8  Total Protein 6.5 - 8.1 g/dL 7.5 7.7 7.6  Total Bilirubin 0.3 - 1.2 mg/dL 0.3 0.5 0.4  Alkaline Phos 38 - 126 U/L 126 183(H) 191(H)  AST 15 - 41 U/L '21 20 14  ' ALT 0 - 44 U/L 21  24 17    Lipid Panel     Component Value Date/Time   CHOL 239 (H) 02/03/2019 1035   TRIG 192 (H) 02/03/2019 1035   HDL 56 02/03/2019 1035   CHOLHDL 4.3 02/03/2019 1035   CHOLHDL 3.4 02/19/2016 1021   VLDL 25 08/29/2015 0849   LDLCALC 148 (H) 02/03/2019 1035    CBC    Component Value Date/Time   WBC 13.4 (H) 03/09/2019 1951   RBC 5.30 (H) 03/09/2019 1951   HGB 13.6 03/09/2019 1951   HGB 12.2 08/27/2016 0919   HCT 43.4 03/09/2019 1951   HCT 38.2 08/27/2016 0919   PLT 384 03/09/2019 1951   PLT 355 08/27/2016 0919   MCV 81.9 03/09/2019 1951   MCV 79 08/27/2016 0919   MCH 25.7 (L) 03/09/2019 1951   MCHC 31.3 03/09/2019 1951   RDW 14.5 03/09/2019 1951   RDW 15.2 08/27/2016 0919   LYMPHSABS 6.4 (H) 03/09/2019 1951   LYMPHSABS 2.5 08/27/2016 0919   MONOABS 0.7 03/09/2019 1951   EOSABS 0.3 03/09/2019 1951   EOSABS 0.2 08/27/2016 0919   BASOSABS 0.0 03/09/2019 1951   BASOSABS 0.0 08/27/2016 0919    Lab Results  Component Value Date   HGBA1C 10.4 (A) 02/03/2019    Assessment & Plan:   1. Type 2 diabetes mellitus with hyperglycemia, with long-term current use of insulin (HCC) Uncontrolled with A1c of 10.4; goal is less than 7 We will send of A1c and adjust regimen accordingly; will likely increase Farxiga dose if A1c is above goal Counseled on Diabetic diet, my plate method, 956 minutes of moderate intensity exercise/week Blood sugar logs with fasting goals of 80-120 mg/dl, random of less than 180 and in the event of sugars  less than 60 mg/dl or greater than 400 mg/dl encouraged to notify the clinic. Advised on the need for annual eye exams, annual foot exams, Pneumonia vaccine. - POCT glucose (manual entry) - Hemoglobin A1c - Lipid panel - Basic Metabolic Panel  2. Essential hypertension, benign Controlled Counseled on blood pressure goal of less than 130/80, low-sodium, DASH diet, medication compliance, 150 minutes of moderate intensity exercise per week. Discussed medication compliance, adverse effects. - amLODipine (NORVASC) 10 MG tablet; Take 1 tablet (10 mg total) by mouth daily.  Dispense: 30 tablet; Refill: 6 - lisinopril-hydrochlorothiazide (ZESTORETIC) 20-25 MG tablet; Take 1 tablet by mouth daily.  Dispense: 30 tablet; Refill: 6  3. Pure hypercholesterolemia Controlled Low-cholesterol diet - atorvastatin (LIPITOR) 80 MG tablet; Take 1 tablet (80 mg total) by mouth daily.  Dispense: 30 tablet; Refill: 6  4. Type 2 diabetes mellitus with other neurologic complication, without long-term current use of insulin (HCC) Stable Linda House has not been taking gabapentin - glipiZIDE (GLIPIZIDE XL) 10 MG 24 hr tablet; TAKE 2 TABLETS (20 MG TOTAL) BY MOUTH DAILY.  Dispense: 60 tablet; Refill: 6  5. Other cataract of left eye Given visual abnormalities in the presence of cataracts and pterygium Linda House will need to see ophthalmology but unfortunately has no medical coverage I have had referral specialist speak with her over the phone and explained the process.  Linda House is willing to pay $100 to be evaluated by [facility] - Ambulatory referral to Ophthalmology - olopatadine (PATANOL) 0.1 % ophthalmic solution; Place 1 drop into both eyes 2 (two) times daily.  Dispense: 5 mL; Refill: 1  6. Hypothyroidism, unspecified type Controlled - TSH - T4, free  7. Left foot pain - diclofenac Sodium (VOLTAREN) 1 % GEL; Apply 4  g topically 4 (four) times daily.  Dispense: 150 g; Refill: 1  8. Pterygium of both eyes See #5  above - Ambulatory referral to Ophthalmology - olopatadine (PATANOL) 0.1 % ophthalmic solution; Place 1 drop into both eyes 2 (two) times daily.  Dispense: 5 mL; Refill: 1  Return in about 3 months (around 08/03/2019) for chronic disease management.   Charlott Rakes, MD, FAAFP. [facility] and [place], [place]   05/04/2019, 8:53 AM

## 2019-05-04 NOTE — Patient Instructions (Signed)
Cataract  A cataract is a buildup of protein that causes the lens of your eye to become cloudy. The lens is normally clear. It is the part of the eye that is behind your iris and pupil. The lens focuses light on the retina, which lets you see clearly. When a lens becomes cloudy, your vision may become blurry. The clouding can range from a tiny dot to complete cloudiness. As some cataracts develop, they can make it harder for you to see things that are far away. (You become more nearsighted.) Other cataracts increase glare. Cataracts can worsen over time, and sometimes the pupil can look white. As cataracts get worse, they cloud more of the lens, making it difficult to see. Cataracts can affect one eye or both eyes. What are the causes? This condition may be caused by age-related eye changes. The lens of the eye is mostly made up of water and protein. Normally, this protein is arranged in a way that keeps the lens clear. Cataracts develop when protein begins to clump together over time. This buildup of protein clouds the lens and lets less light pass through to the retina, which causes blurry vision. What increases the risk? You are more likely to develop this condition if you:  Are 28 years of age or older.  Have diabetes.  Have high blood pressure.  Take certain medicines, such as steroids or hormone replacement therapy.  Have had an eye injury.  Have or have had eye inflammation.  Have a family history of cataracts.  Smoke.  Drink alcohol heavily.  Are frequently exposed to sun or very strong light without eye protection.  Are obese.  Have been exposed to large amounts of radiation, lead, or other toxic substances.  Have had eye surgery. What are the signs or symptoms? The main symptom of a cataract is blurry vision. Your vision may change or get worse over time. Other symptoms include:  Increased glare.  Seeing a bright ring or halo around light.  Poor night  vision.  Double or "shadow" vision in one eye or both eyes.  Having trouble seeing, even while wearing contact lenses or glasses.  Seeing colors that appear faded.  Having trouble telling the difference between blue and purple.  Needing frequent changes to your prescription glasses or contacts. How is this diagnosed? This condition is diagnosed with a medical history and eye exam.  You should see an eye specialist (optometrist or ophthalmologist).  Your health care provider may enlarge (dilate) your pupils with eye drops to see the back of your eye more clearly and look for signs of cataracts or other eye damage. You may also have tests, including:  A visual acuity test. This uses a chart to determine the smallest letters that you can see from a specific distance.  A slit-lamp exam. This uses a microscope to examine small sections of your eye for abnormalities.  Tonometry. This test measures the pressure of the fluid inside your eye.  Glare testing. This test shines a light in your eye while you view letters to see whether the bright light affects your vision. How is this treated? Treatment depends on the stage of your cataract. You may:  Wear eyeglasses or use stronger light. This is for an early-stage cataract.  Have surgery if the condition is severely affecting your vision. This is needed for late-stage cataract.  Stop or change certain medicines. This is recommended if your health care provider thinks your cataract may be linked to your  medicines. Follow these instructions at home: Lifestyle  Use stronger or brighter lighting.  Consider using a magnifying glass for reading or other activities.  Become familiar with your surroundings. Having poor vision can put you at greater risk for tripping, falling, or bumping into things.  Wear sunglasses and a hat if you are sensitive to bright light or are having problems with glare.  Do not use any products that contain  nicotine or tobacco, such as cigarettes, e-cigarettes, and chewing tobacco. If you need help quitting, ask your health care provider. General instructions  If you are prescribed new eyeglasses, wear them as told by your health care provider.  Take over-the-counter and prescription medicines only as told by your health care provider. Do not change your medicines unless told by your health care provider.  Do not drive or use heavy machinery if your vision is blurry, particularly at night.  Keep your blood sugar under control if you have diabetes.  Keep all follow-up visits as told by your health care provider. This is important. Contact a health care provider if:  Your symptoms get worse.  Your vision affects your ability to perform daily activities.  You have new symptoms.  You have a fever. Get help right away if:  You have sudden vision loss.  You have redness, swelling, or increasing pain in your eye.  You develop a headache and sensitivity to light. Summary  A cataract is a buildup of protein that causes the lens of your eye to become cloudy. Cataracts are very common, especially as people age.  Mild cataracts cause mild visual symptoms, while more severe cataracts can cause a significant decrease in quality of life.  Mild cataracts can often be treated with a prescription for new glasses or contact lenses, while surgery is often recommended for more severe cataracts.  Contact a health care provider if your symptoms get worse, your vision affects your ability to do daily activities, or you have a fever.  Get help right away if you have sudden vision loss, redness, swelling, or increasing pain in the eye, or you develop a headache or sensitivity to light. This information is not intended to replace advice given to you by your health care provider. Make sure you discuss any questions you have with your health care provider. Document Revised: 06/30/2017 Document Reviewed:  06/30/2017 Elsevier Patient Education  2020 [employer]vier Inc.  

## 2019-05-05 LAB — BASIC METABOLIC PANEL
BUN/Creatinine Ratio: 20 (ref 12–28)
BUN: 13 mg/dL (ref 8–27)
CO2: 24 mmol/L (ref 20–29)
Calcium: 9.8 mg/dL (ref 8.7–10.3)
Chloride: 100 mmol/L (ref 96–106)
Creatinine, Ser: 0.65 mg/dL (ref 0.57–1.00)
GFR calc Af Amer: 103 mL/min/{1.73_m2} (ref >59)
GFR calc non Af Amer: 89 mL/min/{1.73_m2} (ref >59)
Glucose: 101 mg/dL — ABNORMAL HIGH (ref 65–99)
Potassium: 4.6 mmol/L (ref 3.5–5.2)
Sodium: 141 mmol/L (ref 134–144)

## 2019-05-05 LAB — LIPID PANEL
Chol/HDL Ratio: 3.8 ratio (ref 0.0–4.4)
Cholesterol, Total: 192 mg/dL (ref 100–199)
HDL: 51 mg/dL (ref >39)
LDL Chol Calc (NIH): 118 mg/dL — ABNORMAL HIGH (ref 0–99)
Triglycerides: 131 mg/dL (ref 0–149)
VLDL Cholesterol Cal: 23 mg/dL (ref 5–40)

## 2019-05-05 LAB — T4, FREE: Free T4: 1.72 ng/dL (ref 0.82–1.77)

## 2019-05-05 LAB — HEMOGLOBIN A1C
Est. average glucose Bld gHb Est-mCnc: 166 mg/dL
Hgb A1c MFr Bld: 7.4 % — ABNORMAL HIGH (ref 4.8–5.6)

## 2019-05-05 LAB — TSH: TSH: 1.42 u[IU]/mL (ref 0.450–4.500)

## 2019-05-05 MED FILL — PIOGLITAZONE HCL 45 MG TABS: 45 | 30 days supply | Qty: 30 | Fill #3

## 2019-05-05 MED FILL — LEVOTHYROXINE 137 MCG TAB: 137 | 30 days supply | Qty: 30 | Fill #3

## 2019-05-05 MED FILL — metFORMIN HCL 1000 MG TABS: 1000 | 30 days supply | Qty: 60 | Fill #3

## 2019-05-06 ENCOUNTER — Telehealth: Payer: Self-pay

## 2019-05-06 NOTE — Telephone Encounter (Signed)
-----   Message from Charlott Rakes, MD sent at 05/05/2019  1:32 PM EDT ----- Her diabetes has improved significantly with A1c of 7.4 down from 8.5 and cholesterol has also improved.  Please encouraged to continue her current regimen.

## 2019-05-06 NOTE — Telephone Encounter (Signed)
Patient was called and a voicemail was left informing patient to return phone call for lab results. 

## 2019-05-20 ENCOUNTER — Telehealth: Payer: Self-pay

## 2019-05-20 NOTE — Telephone Encounter (Signed)
-----   Message from Charlott Rakes, MD sent at 05/05/2019  1:32 PM EDT ----- Her diabetes has improved significantly with A1c of 7.4 down from 8.5 and cholesterol has also improved.  Please encouraged to continue her current regimen.

## 2019-05-20 NOTE — Telephone Encounter (Signed)
Patient name and DOB has been verified Patient was informed of lab results. Patient had no questions.  

## 2019-06-09 MED FILL — LISINOPRIL-HYDROCHLOROTHIAZ: 20-25 | 30 days supply | Qty: 30 | Fill #4

## 2019-06-09 MED FILL — AMLODIPINE BESYLATE 10 MG T: 10 | 30 days supply | Qty: 30 | Fill #4

## 2019-06-09 MED FILL — metFORMIN HCL 1000 MG TABS: 1000 | 30 days supply | Qty: 60 | Fill #4

## 2019-06-09 MED FILL — PIOGLITAZONE HCL 45 MG TABS: 45 | 30 days supply | Qty: 30 | Fill #4

## 2019-06-09 MED FILL — glipiZIDE XL 10 MG TB24: 10 | 30 days supply | Qty: 60 | Fill #4

## 2019-06-09 MED FILL — ATORVASTATIN 80 MG TABLET: 80 | 30 days supply | Qty: 30 | Fill #0

## 2019-06-25 ENCOUNTER — Other Ambulatory Visit: Payer: Self-pay | Admitting: *Deleted

## 2019-06-25 DIAGNOSIS — Z1231 Encounter for screening mammogram for malignant neoplasm of breast: Secondary | ICD-10-CM

## 2019-07-12 ENCOUNTER — Other Ambulatory Visit: Payer: Self-pay | Admitting: Family Medicine

## 2019-07-12 DIAGNOSIS — E039 Hypothyroidism, unspecified: Secondary | ICD-10-CM

## 2019-07-12 MED FILL — AMLODIPINE BESYLATE 10 MG T: 10 | 30 days supply | Qty: 30 | Fill #5

## 2019-07-12 MED FILL — glipiZIDE XL 10 MG TB24: 10 | 30 days supply | Qty: 60 | Fill #5

## 2019-07-12 MED FILL — PIOGLITAZONE HCL 45 MG TABS: 45 | 30 days supply | Qty: 30 | Fill #5

## 2019-07-12 MED FILL — LISINOPRIL-HYDROCHLOROTHIAZ: 20-25 | 30 days supply | Qty: 30 | Fill #5

## 2019-07-12 MED FILL — metFORMIN HCL 1000 MG TABS: 1000 | 30 days supply | Qty: 60 | Fill #5

## 2019-07-12 MED FILL — ATORVASTATIN 80 MG TABLET: 80 | 30 days supply | Qty: 30 | Fill #1

## 2019-07-12 NOTE — Telephone Encounter (Signed)
1) Medication(s) Requested (by name): levothyroxine (SYNTHROID) 137 MCG tablet   2) Pharmacy of Choice: [facility] pharmacy   3) Special Requests:   Approved medications will be sent to the pharmacy, we will reach out if there is an issue.  Requests made after 3pm may not be addressed until the following business day!  If a patient is unsure of the name of the medication(s) please note and ask patient to call back when they are able to provide all info, do not send to responsible party until all information is available!

## 2019-07-13 MED FILL — LEVOTHYROXINE 137 MCG TAB: 137 | 30 days supply | Qty: 30 | Fill #0

## 2019-07-15 ENCOUNTER — Ambulatory Visit
Admission: RE | Admit: 2019-07-15 | Discharge: 2019-07-15 | Disposition: A | Discharge status: Home / Self Care | Payer: No Typology Code available for payment source | Source: Ambulatory Visit | Attending: Family Medicine | Admitting: Family Medicine

## 2019-07-15 ENCOUNTER — Other Ambulatory Visit: Payer: Self-pay

## 2019-07-15 DIAGNOSIS — Z1231 Encounter for screening mammogram for malignant neoplasm of breast: Secondary | ICD-10-CM

## 2019-07-26 ENCOUNTER — Encounter: Payer: Self-pay | Admitting: Family Medicine

## 2019-07-26 ENCOUNTER — Ambulatory Visit: Payer: Self-pay | Attending: Family Medicine | Admitting: Family Medicine

## 2019-07-26 ENCOUNTER — Other Ambulatory Visit: Payer: Self-pay | Admitting: Family Medicine

## 2019-07-26 ENCOUNTER — Other Ambulatory Visit: Payer: Self-pay

## 2019-07-26 VITALS — BP 137/73 | HR 79 | Ht 60.0 in | Wt 163.8 lb

## 2019-07-26 DIAGNOSIS — E1149 Type 2 diabetes mellitus with other diabetic neurological complication: Secondary | ICD-10-CM

## 2019-07-26 DIAGNOSIS — E785 Hyperlipidemia, unspecified: Secondary | ICD-10-CM

## 2019-07-26 DIAGNOSIS — Z794 Long term (current) use of insulin: Secondary | ICD-10-CM

## 2019-07-26 DIAGNOSIS — E1165 Type 2 diabetes mellitus with hyperglycemia: Secondary | ICD-10-CM

## 2019-07-26 DIAGNOSIS — E039 Hypothyroidism, unspecified: Secondary | ICD-10-CM

## 2019-07-26 DIAGNOSIS — E1169 Type 2 diabetes mellitus with other specified complication: Secondary | ICD-10-CM

## 2019-07-26 DIAGNOSIS — I10 Essential (primary) hypertension: Secondary | ICD-10-CM

## 2019-07-26 DIAGNOSIS — M1712 Unilateral primary osteoarthritis, left knee: Secondary | ICD-10-CM

## 2019-07-26 LAB — POCT GLYCOSYLATED HEMOGLOBIN (HGB A1C): HbA1c, POC (controlled diabetic range): 7.5 % — AB (ref 0.0–7.0)

## 2019-07-26 LAB — GLUCOSE, POCT (MANUAL RESULT ENTRY): POC Glucose: 84 mg/dl (ref 70–99)

## 2019-07-26 MED ORDER — LISINOPRIL-HYDROCHLOROTHIAZIDE 20-25 MG PO TABS: 1.0000 | ORAL_TABLET | Freq: Every day | ORAL | 6 refills | Status: DC

## 2019-07-26 MED ORDER — AMLODIPINE BESYLATE 10 MG PO TABS: 10.0000 mg | ORAL_TABLET | Freq: Every day | ORAL | 6 refills | Status: DC

## 2019-07-26 MED ORDER — ATORVASTATIN CALCIUM 80 MG PO TABS: 80.0000 mg | ORAL_TABLET | Freq: Every day | ORAL | 6 refills | Status: DC

## 2019-07-26 MED ORDER — METFORMIN HCL 1000 MG PO TABS: 1000.0000 mg | ORAL_TABLET | Freq: Two times a day (BID) | ORAL | 6 refills | Status: DC

## 2019-07-26 MED ORDER — GABAPENTIN 300 MG PO CAPS: 300.0000 mg | ORAL_CAPSULE | Freq: Every day | ORAL | 6 refills | Status: DC

## 2019-07-26 MED ORDER — DAPAGLIFLOZIN PROPANEDIOL 5 MG PO TABS: 5.0000 mg | ORAL_TABLET | Freq: Every day | ORAL | 1 refills | Status: DC

## 2019-07-26 MED ORDER — PIOGLITAZONE HCL 45 MG PO TABS: 45.0000 mg | ORAL_TABLET | Freq: Every day | ORAL | 6 refills | Status: DC

## 2019-07-26 MED ORDER — GLIPIZIDE ER 10 MG PO TB24: ORAL_TABLET | ORAL | 6 refills | Status: DC

## 2019-07-26 MED ORDER — MELOXICAM 7.5 MG PO TABS: 7.5000 mg | ORAL_TABLET | Freq: Every day | ORAL | 2 refills | Status: DC

## 2019-07-26 MED FILL — FARXIGA 5 MG TABLET: 5 | 30 days supply | Qty: 30 | Fill #0

## 2019-07-26 MED FILL — MELOXICAM 7.5 MG TABLET: 7.5 | 30 days supply | Qty: 30 | Fill #0

## 2019-07-26 MED FILL — GABAPENTIN 300 MG CAPSULE: 300 | 30 days supply | Qty: 30 | Fill #0

## 2019-07-26 NOTE — Progress Notes (Signed)
Subjective:  Patient ID: Linda House, female    DOB: [birth year]  Age: 74 y.o. MRN: [id number]  CC: Diabetes   HPI Linda House  is a 74 year old female with a history of uncontrolled type 2 diabetes mellitus (A1c7.5), hypothyroidism, hyperlipidemia, hypertension Referred to Ophthalmology at last visit due to blurry vision and Pterygium but she never made it there. Today she states visual abnormalities have resolved but she has L eye tearing which is improved with Patanol drops which she received at her last visit.  Her diabetes is controlled with no concerns of hypoglycemia.  Neuropathy is controlled on gabapentin.  She endorses compliance with her medication. Also tolerating her statin and her antihypertensive.  Doing well on levothyroxine. She has pain intermittently in her R knee and shoulders but does have meloxicam which she uses for osteoarthritis of her knee.  Past Medical History:  Diagnosis Date  . Diabetes mellitus   . Diabetes mellitus without complication ([place])   . Hypertension     No past surgical history on file.  Family History  Problem Relation Age of Onset  . Diabetes Father   . Hypertension Father     No Known Allergies  Outpatient Medications Prior to Visit  Medication Sig Dispense Refill  . aspirin EC 81 MG tablet Take 1 tablet (81 mg total) by mouth daily. 30 tablet 3  . Blood Glucose Monitoring Suppl (TRUE METRIX METER) w/Device KIT Use to check blood sugar daily. E11.65 1 kit 0  . cyclobenzaprine (FLEXERIL) 10 MG tablet TAKE 1 TABLET BY MOUTH AT BEDTIME. 30 tablet 0  . diclofenac Sodium (VOLTAREN) 1 % GEL Apply 4 g topically 4 (four) times daily. 150 g 1  . glucose blood test strip Use as instructed to check blood sugar daily. E11.65 100 each 11  . levothyroxine (SYNTHROID) 137 MCG tablet TAKE 1 TABLET (137 MCG TOTAL) BY MOUTH DAILY. 30 tablet 1  . olopatadine (PATANOL) 0.1 % ophthalmic solution Place 1 drop into both eyes  2 (two) times daily. 5 mL 1  . TRUEplus Lancets 28G MISC Use to check blood sugar daily. E11.65 100 each 11  . amLODipine (NORVASC) 10 MG tablet Take 1 tablet (10 mg total) by mouth daily. 30 tablet 6  . atorvastatin (LIPITOR) 80 MG tablet Take 1 tablet (80 mg total) by mouth daily. 30 tablet 6  . dapagliflozin propanediol (FARXIGA) 5 MG TABS tablet Take 5 mg by mouth daily before breakfast. 90 tablet 1  . gabapentin (NEURONTIN) 300 MG capsule Take 1 capsule (300 mg total) by mouth at bedtime. 30 capsule 6  . glipiZIDE (GLIPIZIDE XL) 10 MG 24 hr tablet TAKE 2 TABLETS (20 MG TOTAL) BY MOUTH DAILY. 60 tablet 6  . lisinopril-hydrochlorothiazide (ZESTORETIC) 20-25 MG tablet Take 1 tablet by mouth daily. 30 tablet 6  . metFORMIN (GLUCOPHAGE) 1000 MG tablet Take 1 tablet (1,000 mg total) by mouth 2 (two) times daily with a meal. 60 tablet 6  . pioglitazone (ACTOS) 45 MG tablet Take 1 tablet (45 mg total) by mouth daily. 30 tablet 6  . amoxicillin (AMOXIL) 500 MG capsule Take 1 capsule (500 mg total) by mouth 3 (three) times daily. (Patient not taking: Reported on 08/27/2017) 30 capsule 0  . cetirizine (ZYRTEC) 10 MG tablet Take 1 tablet (10 mg total) by mouth daily. (Patient not taking: Reported on 02/03/2019) 30 tablet 1  . meclizine (ANTIVERT) 12.5 MG tablet Take 1 tablet (12.5 mg total) by mouth 2 (two) times daily. (  Patient not taking: Reported on 05/04/2019) 30 tablet 0  . meloxicam (MOBIC) 7.5 MG tablet TAKE 1 TABLET BY MOUTH DAILY. (Patient not taking: Reported on 05/04/2019) 30 tablet 2   No facility-administered medications prior to visit.     ROS Review of Systems  Constitutional: Negative for activity change, appetite change and fatigue.  HENT: Negative for congestion, sinus pressure and sore throat.   Eyes: Negative for visual disturbance.  Respiratory: Negative for cough, chest tightness, shortness of breath and wheezing.   Cardiovascular: Negative for chest pain and palpitations.    Gastrointestinal: Negative for abdominal distention, abdominal pain and constipation.  Endocrine: Negative for polydipsia.  Genitourinary: Negative for dysuria and frequency.  Musculoskeletal:       See HPI  Skin: Negative for rash.  Neurological: Negative for tremors, light-headedness and numbness.  Hematological: Does not bruise/bleed easily.  Psychiatric/Behavioral: Negative for agitation and behavioral problems.    Objective:  BP 137/73   Pulse 79   Ht 5' (1.524 m)   Wt 163 lb 12.8 oz (74.3 kg)   SpO2 98%   BMI 31.99 kg/m   BP/Weight 07/26/2019 05/04/2019 06/20/3014  Systolic BP 010 932 355  Diastolic BP 73 63 67  Wt. (Lbs) 163.8 158 156.8  BMI 31.99 30.86 30.62      Physical Exam Constitutional:      Appearance: She is well-developed.  Neck:     Vascular: No JVD.  Cardiovascular:     Rate and Rhythm: Normal rate.     Heart sounds: Normal heart sounds. No murmur heard.   Pulmonary:     Effort: Pulmonary effort is normal.     Breath sounds: Normal breath sounds. No wheezing or rales.  Chest:     Chest wall: No tenderness.  Abdominal:     General: Bowel sounds are normal. There is no distension.     Palpations: Abdomen is soft. There is no mass.     Tenderness: There is no abdominal tenderness.  Musculoskeletal:        General: Normal range of motion.     Right lower leg: No edema.     Left lower leg: No edema.     Comments: Normal appearance of both knees Crepitus on range of motion bilaterally, no tenderness elicited Full range of motion in bilateral upper extremity with no tenderness in shoulder joints  Neurological:     Mental Status: She is alert and oriented to person, place, and time.  Psychiatric:        Mood and Affect: Mood normal.     CMP Latest Ref Rng & Units 05/04/2019 03/09/2019 02/03/2019  Glucose 65 - 99 mg/dL 101(H) 70 127(H)  BUN 8 - 27 mg/dL 13 31(H) 17  Creatinine 0.57 - 1.00 mg/dL 0.65 0.84 0.71  Sodium 134 - 144 mmol/L 141 136 138   Potassium 3.5 - 5.2 mmol/L 4.6 3.3(L) 4.3  Chloride 96 - 106 mmol/L 100 103 99  CO2 20 - 29 mmol/L '24 23 25  ' Calcium 8.7 - 10.3 mg/dL 9.8 9.5 9.8  Total Protein 6.5 - 8.1 g/dL - 7.5 7.7  Total Bilirubin 0.3 - 1.2 mg/dL - 0.3 0.5  Alkaline Phos 38 - 126 U/L - 126 183(H)  AST 15 - 41 U/L - 21 20  ALT 0 - 44 U/L - 21 24    Lipid Panel     Component Value Date/Time   CHOL 192 05/04/2019 0940   TRIG 131 05/04/2019 0940   HDL 51  05/04/2019 0940   CHOLHDL 3.8 05/04/2019 0940   CHOLHDL 3.4 02/19/2016 1021   VLDL 25 08/29/2015 0849   LDLCALC 118 (H) 05/04/2019 0940    CBC    Component Value Date/Time   WBC 13.4 (H) 03/09/2019 1951   RBC 5.30 (H) 03/09/2019 1951   HGB 13.6 03/09/2019 1951   HGB 12.2 08/27/2016 0919   HCT 43.4 03/09/2019 1951   HCT 38.2 08/27/2016 0919   PLT 384 03/09/2019 1951   PLT 355 08/27/2016 0919   MCV 81.9 03/09/2019 1951   MCV 79 08/27/2016 0919   MCH 25.7 (L) 03/09/2019 1951   MCHC 31.3 03/09/2019 1951   RDW 14.5 03/09/2019 1951   RDW 15.2 08/27/2016 0919   LYMPHSABS 6.4 (H) 03/09/2019 1951   LYMPHSABS 2.5 08/27/2016 0919   MONOABS 0.7 03/09/2019 1951   EOSABS 0.3 03/09/2019 1951   EOSABS 0.2 08/27/2016 0919   BASOSABS 0.0 03/09/2019 1951   BASOSABS 0.0 08/27/2016 0919    Lab Results  Component Value Date   HGBA1C 7.5 (A) 07/26/2019    Assessment & Plan:  1. Type 2 diabetes mellitus with hyperglycemia, with long-term current use of insulin (HCC) Controlled with A1c of 7.5; goal is less than 7.5 due to age Continue current regimen Counseled on Diabetic diet, my plate method, 122 minutes of moderate intensity exercise/week Blood sugar logs with fasting goals of 80-120 mg/dl, random of less than 180 and in the event of sugars less than 60 mg/dl or greater than 400 mg/dl encouraged to notify the clinic. Advised on the need for annual eye exams, annual foot exams, Pneumonia vaccine. - POCT glucose (manual entry) - POCT glycosylated hemoglobin  (Hb A1C) - dapagliflozin propanediol (FARXIGA) 5 MG TABS tablet; Take 1 tablet (5 mg total) by mouth daily before breakfast.  Dispense: 90 tablet; Refill: 1  2. Hypothyroidism, unspecified type Controlled - TSH - T4, free  3. Primary osteoarthritis of left knee Stable We will check renal function given she is on an NSAID - meloxicam (MOBIC) 7.5 MG tablet; Take 1 tablet (7.5 mg total) by mouth daily.  Dispense: 30 tablet; Refill: 2  4. Essential hypertension, benign Controlled Counseled on blood pressure goal of less than 130/80, low-sodium, DASH diet, medication compliance, 150 minutes of moderate intensity exercise per week. Discussed medication compliance, adverse effects. - amLODipine (NORVASC) 10 MG tablet; Take 1 tablet (10 mg total) by mouth daily.  Dispense: 30 tablet; Refill: 6 - lisinopril-hydrochlorothiazide (ZESTORETIC) 20-25 MG tablet; Take 1 tablet by mouth daily.  Dispense: 30 tablet; Refill: 6  5. Hyperlipidemia associated with type 2 diabetes mellitus (HCC) Controlled Low-cholesterol diet - atorvastatin (LIPITOR) 80 MG tablet; Take 1 tablet (80 mg total) by mouth daily.  Dispense: 30 tablet; Refill: 6  6. Type 2 diabetes mellitus with other neurologic complication, without long-term current use of insulin (HCC) Controlled on gabapentin - gabapentin (NEURONTIN) 300 MG capsule; Take 1 capsule (300 mg total) by mouth at bedtime.  Dispense: 30 capsule; Refill: 6 - glipiZIDE (GLIPIZIDE XL) 10 MG 24 hr tablet; TAKE 2 TABLETS (20 MG TOTAL) BY MOUTH DAILY.  Dispense: 60 tablet; Refill: 6 - metFORMIN (GLUCOPHAGE) 1000 MG tablet; Take 1 tablet (1,000 mg total) by mouth 2 (two) times daily with a meal.  Dispense: 60 tablet; Refill: 6 - pioglitazone (ACTOS) 45 MG tablet; Take 1 tablet (45 mg total) by mouth daily.  Dispense: 30 tablet; Refill: 6  Health Care Maintenance: Up-to-date Meds ordered this encounter  Medications  .  meloxicam (MOBIC) 7.5 MG tablet    Sig: Take 1  tablet (7.5 mg total) by mouth daily.    Dispense:  30 tablet    Refill:  2  . amLODipine (NORVASC) 10 MG tablet    Sig: Take 1 tablet (10 mg total) by mouth daily.    Dispense:  30 tablet    Refill:  6  . atorvastatin (LIPITOR) 80 MG tablet    Sig: Take 1 tablet (80 mg total) by mouth daily.    Dispense:  30 tablet    Refill:  6    Dose change  . dapagliflozin propanediol (FARXIGA) 5 MG TABS tablet    Sig: Take 1 tablet (5 mg total) by mouth daily before breakfast.    Dispense:  90 tablet    Refill:  1  . gabapentin (NEURONTIN) 300 MG capsule    Sig: Take 1 capsule (300 mg total) by mouth at bedtime.    Dispense:  30 capsule    Refill:  6  . glipiZIDE (GLIPIZIDE XL) 10 MG 24 hr tablet    Sig: TAKE 2 TABLETS (20 MG TOTAL) BY MOUTH DAILY.    Dispense:  60 tablet    Refill:  6  . lisinopril-hydrochlorothiazide (ZESTORETIC) 20-25 MG tablet    Sig: Take 1 tablet by mouth daily.    Dispense:  30 tablet    Refill:  6  . metFORMIN (GLUCOPHAGE) 1000 MG tablet    Sig: Take 1 tablet (1,000 mg total) by mouth 2 (two) times daily with a meal.    Dispense:  60 tablet    Refill:  6  . pioglitazone (ACTOS) 45 MG tablet    Sig: Take 1 tablet (45 mg total) by mouth daily.    Dispense:  30 tablet    Refill:  6    Follow-up: Return in about 3 months (around 10/26/2019) for Chronic disease management.       Charlott Rakes, MD, FAAFP. [facility] and [place], [place]   07/26/2019, 9:00 AM

## 2019-07-26 NOTE — Patient Instructions (Signed)
La diabetes mellitus y el cuidado de los pies  Diabetes Mellitus and Foot Care  El cuidado de los pies es un aspecto importante de la salud, especialmente si tiene diabetes. La diabetes puede generar problemas debido a que el flujo sanguíneo (circulación) es deficiente en las piernas y los pies, y esto puede hacer que la piel:  · Se torne más fina y seca.  · Se resquebraje más fácilmente.  · Cicatrice más lentamente.  · Se descame y agriete.  También pueden estar dañados los nervios (neuropatía) de las piernas y de los pies, lo que provoca una disminución de la sensibilidad. En consecuencia, es posible que no advierta heridas pequeñas en los pies que pueden causar problemas más graves. Identificar y tratar cualquier complicación lo antes posible es la mejor manera de evitar futuros problemas de pie.  Cómo cuidar los pies  Higiene de los pies  · Lávese los pies todos los días con agua tibia y un jabón suave. No use agua caliente. Luego séquese los pies y entre los dedos dando palmaditas, hasta que estén completamente secos. No remoje los pies, ya que esto puede resecar la piel.  · Córtese las uñas de los pies en línea recta. No escarbe debajo de las uñas o alrededor de las cutículas. Lime los bordes de las uñas con una lima o esmeril.  · Aplique una loción hidratante o vaselina en la piel de los pies y en las uñas secas y quebradizas. Use una loción que no contenga alcohol ni fragancias. No aplique loción entre los dedos.  Zapatos y calcetines  · Use calcetines de algodón o medias limpias todos los días. Asegúrese de que no le ajusten demasiado. No use calcetines que le lleguen a las rodillas, ya que podrían disminuir el flujo de sangre a las piernas.  · Use zapatos de cuero que le queden bien y que sean acolchados. Revise siempre los zapatos antes de ponerlos para asegurarse de que no haya objetos en su interior.  · Para amoldar los zapatos, cálcelos solo algunas horas por día. Esto evitará lesiones en los  pies.  Heridas, rasguños, durezas y callosidades  · Controle sus pies diariamente para observar si hay ampollas, cortes, moretones, llagas o enrojecimiento. Si no puede ver la planta del pie, use un espejo o pídale ayuda a otra persona.  · No corte las durezas o callosidades, ni trate de quitarlas con medicamentos.  · Si algo le ha raspado, cortado o lastimado la piel de los pies, mantenga la piel de esa zona limpia y seca. Puede higienizar estas zonas con agua y un jabón suave. No limpie la zona con agua oxigenada, alcohol ni yodo.  · Si tiene una herida, un rasguño, una dureza o una callosidad en el pie, revísela varias veces al día para asegurarse de que se esté curando y no se infecte. Esté atento a los siguientes signos:  ? Dolor, hinchazón o enrojecimiento.  ? Líquido o sangre.  ? Calor.  ? Pus o mal olor.  Instrucciones generales  · No se cruce de piernas. Esto puede disminuir el flujo de sangre a los pies.  · No use bolsas de agua caliente ni almohadillas térmicas en los pies. Podrían causar quemaduras. Si ha perdido la sensibilidad en los pies o las piernas, no sabrá lo que le está sucediendo hasta que sea demasiado tarde.  · Proteja sus pies del calor y del frío con calzado, en la playa o sobre el pavimento caliente.  · Programe una cita   pies, infrmele al mdico de inmediato sobre los cortes, las llagas o los moretones. Comunquese con un mdico si:  Tiene una afeccin que aumenta su riesgo de tener infecciones y tiene cortes, llagas o moretones en los pies.  Tiene una lesin que no se cura.  Tiene una zona irritada en las piernas o los pies.  Siente una sensacin de ardor u hormigueo en las piernas o los pies.  Siente dolor o calambres en las piernas o los pies.  Las piernas o los pies estn adormecidos.  Siente los pies siempre  fros.  Siente dolor alrededor de una ua del pie. Solicite ayuda de inmediato si:  Tiene una herida, un rasguo, una dureza o una callosidad en el pie y: ? Tiene dolor, hinchazn o enrojecimiento que empeora. ? Le sale lquido o sangre de la herida, el rasguo, la dureza o la callosidad. ? La herida, el rasguo, la dureza o la callosidad est caliente al tacto. ? Le sale pus o mal olor de la herida, el rasguo, la dureza o la callosidad. ? Tiene fiebre. ? Tiene una lnea roja que sube por la pierna. Resumen  Controle todos los das el estado de sus pies para observar si hay cortes, llagas, manchas rojas, hinchazn o ampollas.  Humctese los pies y las piernas a diario.  Use zapatos de cuero que le queden bien y que sean acolchados.  Si tiene problemas en los pies, infrmele al mdico de inmediato sobre los cortes, las llagas o los moretones.  Programe una cita para un examen completo de los pies por lo menos una vez al ao (anualmente) o con ms frecuencia si tiene problemas en los pies. Esta informacin no tiene como fin reemplazar el consejo del mdico. Asegrese de hacerle al mdico cualquier pregunta que tenga. Document Revised: 08/23/2016 Document Reviewed: 08/23/2016 Elsevier Patient Education  2020 Elsevier Inc.  

## 2019-07-27 ENCOUNTER — Other Ambulatory Visit: Payer: Self-pay | Admitting: Family Medicine

## 2019-07-27 DIAGNOSIS — E039 Hypothyroidism, unspecified: Secondary | ICD-10-CM

## 2019-07-27 LAB — T4, FREE: Free T4: 1.47 ng/dL (ref 0.82–1.77)

## 2019-07-27 LAB — TSH: TSH: 1.23 u[IU]/mL (ref 0.450–4.500)

## 2019-07-27 MED ORDER — LEVOTHYROXINE SODIUM 137 MCG PO TABS: 137.0000 ug | ORAL_TABLET | Freq: Every day | ORAL | 3 refills | Status: DC

## 2019-07-29 ENCOUNTER — Telehealth: Payer: Self-pay

## 2019-07-29 NOTE — Telephone Encounter (Signed)
Patient was called and informed of lab results via voicemail. 

## 2019-07-29 NOTE — Telephone Encounter (Signed)
-----   Message from Charlott Rakes, MD sent at 07/22/2019  6:04 PM EDT ----- Mammogram is negative for malignancy

## 2019-08-05 ENCOUNTER — Telehealth: Payer: Self-pay

## 2019-08-05 NOTE — Telephone Encounter (Signed)
Patient was called and informed of lab results via voicemail. 

## 2019-08-05 NOTE — Telephone Encounter (Signed)
-----   Message from Charlott Rakes, MD sent at 07/27/2019  8:44 AM EDT ----- Thyroid level is normal

## 2019-09-02 MED FILL — $FARXIGA 5 MG TABLET: 5 | 90 days supply | Qty: 90 | Fill #1

## 2019-09-03 MED FILL — glipiZIDE XL 10 MG TB24: 10 | 30 days supply | Qty: 60 | Fill #0

## 2019-09-03 MED FILL — PIOGLITAZONE HCL 45 MG TABS: 45 | 30 days supply | Qty: 30 | Fill #6

## 2019-09-03 MED FILL — LISINOPRIL-HYDROCHLOROTHIAZ: 20-25 | 30 days supply | Qty: 30 | Fill #6

## 2019-09-03 MED FILL — MELOXICAM 7.5 MG TABLET: 7.5 | 30 days supply | Qty: 30 | Fill #1

## 2019-09-03 MED FILL — ATORVASTATIN 80 MG TABLET: 80 | 30 days supply | Qty: 30 | Fill #2

## 2019-09-03 MED FILL — AMLODIPINE BESYLATE 10 MG T: 10 | 30 days supply | Qty: 30 | Fill #6

## 2019-09-03 MED FILL — metFORMIN HCL 1000 MG TABS: 1000 | 30 days supply | Qty: 60 | Fill #6

## 2019-10-12 MED FILL — LISINOPRIL-HYDROCHLOROTHIAZ: 20-25 | 30 days supply | Qty: 30 | Fill #0

## 2019-10-12 MED FILL — AMLODIPINE BESYLATE 10 MG T: 10 | 30 days supply | Qty: 30 | Fill #0

## 2019-10-12 MED FILL — glipiZIDE XL 10 MG TB24: 10 | 30 days supply | Qty: 60 | Fill #1

## 2019-10-12 MED FILL — ATORVASTATIN CALCIUM 80 MG: 80 | 30 days supply | Qty: 30 | Fill #3

## 2019-10-12 MED FILL — METFORMIN HCL 1000 MG TABS: 1000 | 30 days supply | Qty: 60 | Fill #0

## 2019-10-12 MED FILL — LEVOTHYROXINE 137 MCG TAB: 137 | 30 days supply | Qty: 30 | Fill #1

## 2019-10-12 MED FILL — MELOXICAM 7.5 MG TABLET: 7.5 | 30 days supply | Qty: 30 | Fill #2

## 2019-10-26 ENCOUNTER — Encounter: Payer: Self-pay | Admitting: Family Medicine

## 2019-10-26 ENCOUNTER — Other Ambulatory Visit: Payer: Self-pay | Admitting: Family Medicine

## 2019-10-26 ENCOUNTER — Other Ambulatory Visit: Payer: Self-pay

## 2019-10-26 ENCOUNTER — Ambulatory Visit: Payer: Self-pay | Attending: Family Medicine | Admitting: Family Medicine

## 2019-10-26 VITALS — BP 116/67 | HR 74 | Ht 60.0 in | Wt 162.6 lb

## 2019-10-26 DIAGNOSIS — E1149 Type 2 diabetes mellitus with other diabetic neurological complication: Secondary | ICD-10-CM

## 2019-10-26 DIAGNOSIS — E039 Hypothyroidism, unspecified: Secondary | ICD-10-CM

## 2019-10-26 DIAGNOSIS — E1169 Type 2 diabetes mellitus with other specified complication: Secondary | ICD-10-CM

## 2019-10-26 DIAGNOSIS — E785 Hyperlipidemia, unspecified: Secondary | ICD-10-CM

## 2019-10-26 DIAGNOSIS — E1165 Type 2 diabetes mellitus with hyperglycemia: Secondary | ICD-10-CM

## 2019-10-26 DIAGNOSIS — I10 Essential (primary) hypertension: Secondary | ICD-10-CM

## 2019-10-26 DIAGNOSIS — Z794 Long term (current) use of insulin: Secondary | ICD-10-CM

## 2019-10-26 DIAGNOSIS — Z23 Encounter for immunization: Secondary | ICD-10-CM

## 2019-10-26 DIAGNOSIS — Z1211 Encounter for screening for malignant neoplasm of colon: Secondary | ICD-10-CM

## 2019-10-26 LAB — GLUCOSE, POCT (MANUAL RESULT ENTRY): POC Glucose: 111 mg/dl — AB (ref 70–99)

## 2019-10-26 LAB — POCT GLYCOSYLATED HEMOGLOBIN (HGB A1C): HbA1c, POC (controlled diabetic range): 7.5 % — AB (ref 0.0–7.0)

## 2019-10-26 MED ORDER — ATORVASTATIN CALCIUM 80 MG PO TABS: 80.0000 mg | ORAL_TABLET | Freq: Every day | ORAL | 6 refills | Status: DC

## 2019-10-26 MED ORDER — AMLODIPINE BESYLATE 10 MG PO TABS: 10.0000 mg | ORAL_TABLET | Freq: Every day | ORAL | 6 refills | Status: DC

## 2019-10-26 MED ORDER — PIOGLITAZONE HCL 45 MG PO TABS: 45.0000 mg | ORAL_TABLET | Freq: Every day | ORAL | 6 refills | Status: DC

## 2019-10-26 MED ORDER — LISINOPRIL-HYDROCHLOROTHIAZIDE 20-25 MG PO TABS: 1.0000 | ORAL_TABLET | Freq: Every day | ORAL | 6 refills | Status: DC

## 2019-10-26 MED ORDER — GLIPIZIDE ER 10 MG PO TB24: ORAL_TABLET | ORAL | 6 refills | Status: DC

## 2019-10-26 MED ORDER — GABAPENTIN 300 MG PO CAPS: 300.0000 mg | ORAL_CAPSULE | Freq: Every day | ORAL | 6 refills | Status: DC

## 2019-10-26 MED ORDER — DAPAGLIFLOZIN PROPANEDIOL 5 MG PO TABS: 5.0000 mg | ORAL_TABLET | Freq: Every day | ORAL | 1 refills | Status: DC

## 2019-10-26 MED ORDER — METFORMIN HCL 1000 MG PO TABS: 1000.0000 mg | ORAL_TABLET | Freq: Two times a day (BID) | ORAL | 6 refills | Status: DC

## 2019-10-26 MED FILL — GABAPENTIN 300 MG CAPSULE: 300 | 30 days supply | Qty: 30 | Fill #0

## 2019-10-26 MED FILL — PIOGLITAZONE HCL 45 MG TABS: 45 | 30 days supply | Qty: 30 | Fill #0

## 2019-10-26 NOTE — Progress Notes (Signed)
Subjective:  Patient ID: Linda House, female    DOB: Mar 11, 1945  Age: 74 y.o. MRN: [id number]  CC: Diabetes   HPI Linda House is a 74 year old female with a history of uncontrolled type 2 diabetes mellitus (A1c7.5), hypothyroidism, hyperlipidemia, hypertension who presents today for chronic disease management. With regards to her diabetes mellitus she denies hypoglycemia, numbness in extremities, visual concerns and endorses compliance with her medications and a diabetic diet. Her hypothyroidism is controlled with last thyroid panel from 07/2019 which was normal. Doing well on her antihypertensive and statin and denies adverse effects from her medications. She has no acute concerns at this time  Past Medical History:  Diagnosis Date  . Diabetes mellitus   . Diabetes mellitus without complication ([place])   . Hypertension     History reviewed. No pertinent surgical history.  Family History  Problem Relation Age of Onset  . Diabetes Father   . Hypertension Father     No Known Allergies  Outpatient Medications Prior to Visit  Medication Sig Dispense Refill  . aspirin EC 81 MG tablet Take 1 tablet (81 mg total) by mouth daily. 30 tablet 3  . Blood Glucose Monitoring Suppl (TRUE METRIX METER) w/Device KIT Use to check blood sugar daily. E11.65 1 kit 0  . cyclobenzaprine (FLEXERIL) 10 MG tablet TAKE 1 TABLET BY MOUTH AT BEDTIME. 30 tablet 0  . diclofenac Sodium (VOLTAREN) 1 % GEL Apply 4 g topically 4 (four) times daily. 150 g 1  . glucose blood test strip Use as instructed to check blood sugar daily. E11.65 100 each 11  . levothyroxine (SYNTHROID) 137 MCG tablet Take 1 tablet (137 mcg total) by mouth daily. 30 tablet 3  . meloxicam (MOBIC) 7.5 MG tablet Take 1 tablet (7.5 mg total) by mouth daily. 30 tablet 2  . olopatadine (PATANOL) 0.1 % ophthalmic solution Place 1 drop into both eyes 2 (two) times daily. 5 mL 1  . TRUEplus Lancets 28G MISC Use to  check blood sugar daily. E11.65 100 each 11  . amLODipine (NORVASC) 10 MG tablet Take 1 tablet (10 mg total) by mouth daily. 30 tablet 6  . atorvastatin (LIPITOR) 80 MG tablet Take 1 tablet (80 mg total) by mouth daily. 30 tablet 6  . dapagliflozin propanediol (FARXIGA) 5 MG TABS tablet Take 1 tablet (5 mg total) by mouth daily before breakfast. 90 tablet 1  . gabapentin (NEURONTIN) 300 MG capsule Take 1 capsule (300 mg total) by mouth at bedtime. 30 capsule 6  . glipiZIDE (GLIPIZIDE XL) 10 MG 24 hr tablet TAKE 2 TABLETS (20 MG TOTAL) BY MOUTH DAILY. 60 tablet 6  . lisinopril-hydrochlorothiazide (ZESTORETIC) 20-25 MG tablet Take 1 tablet by mouth daily. 30 tablet 6  . metFORMIN (GLUCOPHAGE) 1000 MG tablet Take 1 tablet (1,000 mg total) by mouth 2 (two) times daily with a meal. 60 tablet 6  . pioglitazone (ACTOS) 45 MG tablet Take 1 tablet (45 mg total) by mouth daily. 30 tablet 6  . amoxicillin (AMOXIL) 500 MG capsule Take 1 capsule (500 mg total) by mouth 3 (three) times daily. (Patient not taking: Reported on 08/27/2017) 30 capsule 0  . cetirizine (ZYRTEC) 10 MG tablet Take 1 tablet (10 mg total) by mouth daily. (Patient not taking: Reported on 02/03/2019) 30 tablet 1  . meclizine (ANTIVERT) 12.5 MG tablet Take 1 tablet (12.5 mg total) by mouth 2 (two) times daily. (Patient not taking: Reported on 05/04/2019) 30 tablet 0   No facility-administered medications  prior to visit.     ROS Review of Systems  Constitutional: Negative for activity change, appetite change and fatigue.  HENT: Negative for congestion, sinus pressure and sore throat.   Eyes: Negative for visual disturbance.  Respiratory: Negative for cough, chest tightness, shortness of breath and wheezing.   Cardiovascular: Negative for chest pain and palpitations.  Gastrointestinal: Negative for abdominal distention, abdominal pain and constipation.  Endocrine: Negative for polydipsia.  Genitourinary: Negative for dysuria and  frequency.  Musculoskeletal: Negative for arthralgias and back pain.  Skin: Negative for rash.  Neurological: Negative for tremors, light-headedness and numbness.  Hematological: Does not bruise/bleed easily.  Psychiatric/Behavioral: Negative for agitation and behavioral problems.    Objective:  BP 116/67   Pulse 74   Ht 5' (1.524 m)   Wt 162 lb 9.6 oz (73.8 kg)   SpO2 98%   BMI 31.76 kg/m   BP/Weight 10/26/2019 07/26/2019 07/18/4923  Systolic BP 241 590 172  Diastolic BP 67 73 63  Wt. (Lbs) 162.6 163.8 158  BMI 31.76 31.99 30.86      Physical Exam Constitutional:      Appearance: She is well-developed.  Neck:     Vascular: No JVD.  Cardiovascular:     Rate and Rhythm: Normal rate.     Heart sounds: Normal heart sounds. No murmur heard.   Pulmonary:     Effort: Pulmonary effort is normal.     Breath sounds: Normal breath sounds. No wheezing or rales.  Chest:     Chest wall: No tenderness.  Abdominal:     General: Bowel sounds are normal. There is no distension.     Palpations: Abdomen is soft. There is no mass.     Tenderness: There is no abdominal tenderness.  Musculoskeletal:        General: Normal range of motion.     Right lower leg: No edema.     Left lower leg: No edema.  Neurological:     Mental Status: She is alert and oriented to person, place, and time.  Psychiatric:        Mood and Affect: Mood normal.    Diabetic Foot Exam - Simple   Simple Foot Form Diabetic Foot exam was performed with the following findings: Yes 10/26/2019  9:04 AM  Visual Inspection No deformities, no ulcerations, no other skin breakdown bilaterally: Yes Sensation Testing Intact to touch and monofilament testing bilaterally: Yes Pulse Check Posterior Tibialis and Dorsalis pulse intact bilaterally: Yes Comments      CMP Latest Ref Rng & Units 05/04/2019 03/09/2019 02/03/2019  Glucose 65 - 99 mg/dL 101(H) 70 127(H)  BUN 8 - 27 mg/dL 13 31(H) 17  Creatinine 0.57 - 1.00  mg/dL 0.65 0.84 0.71  Sodium 134 - 144 mmol/L 141 136 138  Potassium 3.5 - 5.2 mmol/L 4.6 3.3(L) 4.3  Chloride 96 - 106 mmol/L 100 103 99  CO2 20 - 29 mmol/L _0 Calcium 8.7 - 10.3 mg/dL 9.8 9.5 9.8  Total Protein 6.5 - 8.1 g/dL - 7.5 7.7  Total Bilirubin 0.3 - 1.2 mg/dL - 0.3 0.5  Alkaline Phos 38 - 126 U/L - 126 183(H)  AST 15 - 41 U/L - 21 20  ALT 0 - 44 U/L - 21 24    Lipid Panel     Component Value Date/Time   CHOL 192 05/04/2019 0940   TRIG 131 05/04/2019 0940   HDL 51 05/04/2019 0940   CHOLHDL 3.8 05/04/2019 0940  CHOLHDL 3.4 02/19/2016 1021   VLDL 25 08/29/2015 0849   LDLCALC 118 (H) 05/04/2019 0940    CBC    Component Value Date/Time   WBC 13.4 (H) 03/09/2019 1951   RBC 5.30 (H) 03/09/2019 1951   HGB 13.6 03/09/2019 1951   HGB 12.2 08/27/2016 0919   HCT 43.4 03/09/2019 1951   HCT 38.2 08/27/2016 0919   PLT 384 03/09/2019 1951   PLT 355 08/27/2016 0919   MCV 81.9 03/09/2019 1951   MCV 79 08/27/2016 0919   MCH 25.7 (L) 03/09/2019 1951   MCHC 31.3 03/09/2019 1951   RDW 14.5 03/09/2019 1951   RDW 15.2 08/27/2016 0919   LYMPHSABS 6.4 (H) 03/09/2019 1951   LYMPHSABS 2.5 08/27/2016 0919   MONOABS 0.7 03/09/2019 1951   EOSABS 0.3 03/09/2019 1951   EOSABS 0.2 08/27/2016 0919   BASOSABS 0.0 03/09/2019 1951   BASOSABS 0.0 08/27/2016 0919    Lab Results  Component Value Date   HGBA1C 7.5 (A) 10/26/2019    Assessment & Plan:  1. Type 2 diabetes mellitus with hyperglycemia, with long-term current use of insulin (HCC) Controlled with A1c of 7.5 Continue current regimen Counseled on Diabetic diet, my plate method, 017 minutes of moderate intensity exercise/week Blood sugar logs with fasting goals of 80-120 mg/dl, random of less than 180 and in the event of sugars less than 60 mg/dl or greater than 400 mg/dl encouraged to notify the clinic. Advised on the need for annual eye exams, annual foot exams, Pneumonia vaccine. - POCT glycosylated hemoglobin (Hb  A1C) - POCT glucose (manual entry) - CMP14+EGFR - Lipid panel - dapagliflozin propanediol (FARXIGA) 5 MG TABS tablet; Take 1 tablet (5 mg total) by mouth daily before breakfast.  Dispense: 90 tablet; Refill: 1  2. Screening for colon cancer Due to lack of medical insurance will place order for FIT test - Fecal occult blood, imunochemical([facility])  3. 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  Dispense: 30 tablet; Refill: 6 - metFORMIN (GLUCOPHAGE) 1000 MG tablet; Take 1 tablet (1,000 mg total) by mouth 2 (two) times daily with a meal.  Dispense: 60 tablet; Refill: 6 - glipiZIDE (GLIPIZIDE XL) 10 MG 24 hr tablet; TAKE 2 TABLETS (20 MG TOTAL) BY MOUTH DAILY.  Dispense: 60 tablet; Refill: 6 - gabapentin (NEURONTIN) 300 MG capsule; Take 1 capsule (300 mg total) by mouth at bedtime.  Dispense: 30 capsule; Refill: 6  4. Essential hypertension, benign Controlled Continue current regimen Counseled on blood pressure goal of less than 130/80, low-sodium, DASH diet, medication compliance, 150 minutes of moderate intensity exercise per week. Discussed medication compliance, adverse effects. - lisinopril-hydrochlorothiazide (ZESTORETIC) 20-25 MG tablet; Take 1 tablet by mouth daily.  Dispense: 30 tablet; Refill: 6 - amLODipine (NORVASC) 10 MG tablet; Take 1 tablet (10 mg total) by mouth daily.  Dispense: 30 tablet; Refill: 6  5. Hyperlipidemia associated with type 2 diabetes mellitus (HCC) Controlled - atorvastatin (LIPITOR) 80 MG tablet; Take 1 tablet (80 mg total) by mouth daily.  Dispense: 30 tablet; Refill: 6  6. Hypothyroidism, unspecified type Controlled - T4, free - TSH   Health Care Maintenance: See #2 above Meds ordered this encounter  Medications  . pioglitazone (ACTOS) 45 MG tablet     Sig: Take 1 tablet (45 mg total) by mouth daily.    Dispense:  30 tablet    Refill:  6  . metFORMIN (GLUCOPHAGE) 1000 MG tablet    Sig: Take 1 tablet (1,000 mg total) by mouth 2 (two) times daily with a meal.    Dispense:  60 tablet    Refill:  6  . lisinopril-hydrochlorothiazide (ZESTORETIC) 20-25 MG tablet    Sig: Take 1 tablet by mouth daily.    Dispense:  30 tablet    Refill:  6  . glipiZIDE (GLIPIZIDE XL) 10 MG 24 hr tablet    Sig: TAKE 2 TABLETS (20 MG TOTAL) BY MOUTH DAILY.    Dispense:  60 tablet    Refill:  6  . gabapentin (NEURONTIN) 300 MG capsule    Sig: Take 1 capsule (300 mg total) by mouth at bedtime.    Dispense:  30 capsule    Refill:  6  . dapagliflozin propanediol (FARXIGA) 5 MG TABS tablet    Sig: Take 1 tablet (5 mg total) by mouth daily before breakfast.    Dispense:  90 tablet    Refill:  1  . atorvastatin (LIPITOR) 80 MG tablet    Sig: Take 1 tablet (80 mg total) by mouth daily.    Dispense:  30 tablet    Refill:  6    Dose change  . amLODipine (NORVASC) 10 MG tablet    Sig: Take 1 tablet (10 mg total) by mouth daily.    Dispense:  30 tablet    Refill:  6    Follow-up: Return in about 6 months (around 04/25/2020) for Chronic disease management.       Charlott Rakes, MD, FAAFP. [facility], [place]   10/26/2019, 9:54 AM

## 2019-10-26 NOTE — Patient Instructions (Signed)
La diabetes mellitus y el cuidado de los pies  Diabetes Mellitus and Foot Care  El cuidado de los pies es un aspecto importante de la salud, especialmente si tiene diabetes. La diabetes puede generar problemas debido a que el flujo sanguíneo (circulación) es deficiente en las piernas y los pies, y esto puede hacer que la piel:  · Se torne más fina y seca.  · Se resquebraje más fácilmente.  · Cicatrice más lentamente.  · Se descame y agriete.  También pueden estar dañados los nervios (neuropatía) de las piernas y de los pies, lo que provoca una disminución de la sensibilidad. En consecuencia, es posible que no advierta heridas pequeñas en los pies que pueden causar problemas más graves. Identificar y tratar cualquier complicación lo antes posible es la mejor manera de evitar futuros problemas de pie.  Cómo cuidar los pies  Higiene de los pies  · Lávese los pies todos los días con agua tibia y un jabón suave. No use agua caliente. Luego séquese los pies y entre los dedos dando palmaditas, hasta que estén completamente secos. No remoje los pies, ya que esto puede resecar la piel.  · Córtese las uñas de los pies en línea recta. No escarbe debajo de las uñas o alrededor de las cutículas. Lime los bordes de las uñas con una lima o esmeril.  · Aplique una loción hidratante o vaselina en la piel de los pies y en las uñas secas y quebradizas. Use una loción que no contenga alcohol ni fragancias. No aplique loción entre los dedos.  Zapatos y calcetines  · Use calcetines de algodón o medias limpias todos los días. Asegúrese de que no le ajusten demasiado. No use calcetines que le lleguen a las rodillas, ya que podrían disminuir el flujo de sangre a las piernas.  · Use zapatos de cuero que le queden bien y que sean acolchados. Revise siempre los zapatos antes de ponerlos para asegurarse de que no haya objetos en su interior.  · Para amoldar los zapatos, cálcelos solo algunas horas por día. Esto evitará lesiones en los  pies.  Heridas, rasguños, durezas y callosidades  · Controle sus pies diariamente para observar si hay ampollas, cortes, moretones, llagas o enrojecimiento. Si no puede ver la planta del pie, use un espejo o pídale ayuda a otra persona.  · No corte las durezas o callosidades, ni trate de quitarlas con medicamentos.  · Si algo le ha raspado, cortado o lastimado la piel de los pies, mantenga la piel de esa zona limpia y seca. Puede higienizar estas zonas con agua y un jabón suave. No limpie la zona con agua oxigenada, alcohol ni yodo.  · Si tiene una herida, un rasguño, una dureza o una callosidad en el pie, revísela varias veces al día para asegurarse de que se esté curando y no se infecte. Esté atento a los siguientes signos:  ? Dolor, hinchazón o enrojecimiento.  ? Líquido o sangre.  ? Calor.  ? Pus o mal olor.  Instrucciones generales  · No se cruce de piernas. Esto puede disminuir el flujo de sangre a los pies.  · No use bolsas de agua caliente ni almohadillas térmicas en los pies. Podrían causar quemaduras. Si ha perdido la sensibilidad en los pies o las piernas, no sabrá lo que le está sucediendo hasta que sea demasiado tarde.  · Proteja sus pies del calor y del frío con calzado, en la playa o sobre el pavimento caliente.  · Programe una cita   pies, infrmele al mdico de inmediato sobre los cortes, las llagas o los moretones. Comunquese con un mdico si:  Tiene una afeccin que aumenta su riesgo de tener infecciones y tiene cortes, llagas o moretones en los pies.  Tiene una lesin que no se cura.  Tiene una zona irritada en las piernas o los pies.  Siente una sensacin de ardor u hormigueo en las piernas o los pies.  Siente dolor o calambres en las piernas o los pies.  Las piernas o los pies estn adormecidos.  Siente los pies siempre  fros.  Siente dolor alrededor de una ua del pie. Solicite ayuda de inmediato si:  Tiene una herida, un rasguo, una dureza o una callosidad en el pie y: ? Tiene dolor, hinchazn o enrojecimiento que empeora. ? Le sale lquido o sangre de la herida, el rasguo, la dureza o la callosidad. ? La herida, el rasguo, la dureza o la callosidad est caliente al tacto. ? Le sale pus o mal olor de la herida, el rasguo, la dureza o la callosidad. ? Tiene fiebre. ? Tiene una lnea roja que sube por la pierna. Resumen  Controle todos los das el estado de sus pies para observar si hay cortes, llagas, manchas rojas, hinchazn o ampollas.  Humctese los pies y las piernas a diario.  Use zapatos de cuero que le queden bien y que sean acolchados.  Si tiene problemas en los pies, infrmele al mdico de inmediato sobre los cortes, las llagas o los moretones.  Programe una cita para un examen completo de los pies por lo menos una vez al ao (anualmente) o con ms frecuencia si tiene problemas en los pies. Esta informacin no tiene como fin reemplazar el consejo del mdico. Asegrese de hacerle al mdico cualquier pregunta que tenga. Document Revised: 08/23/2016 Document Reviewed: 08/23/2016 Elsevier Patient Education  2020 Elsevier Inc.  

## 2019-10-27 LAB — CMP14+EGFR
ALT: 18 IU/L (ref 0–32)
AST: 16 IU/L (ref 0–40)
Albumin/Globulin Ratio: 1.4 (ref 1.2–2.2)
Albumin: 4.3 g/dL (ref 3.7–4.7)
Alkaline Phosphatase: 173 IU/L — ABNORMAL HIGH (ref 44–121)
BUN/Creatinine Ratio: 14 (ref 12–28)
BUN: 11 mg/dL (ref 8–27)
Bilirubin Total: 0.4 mg/dL (ref 0.0–1.2)
CO2: 25 mmol/L (ref 20–29)
Calcium: 9.4 mg/dL (ref 8.7–10.3)
Chloride: 97 mmol/L (ref 96–106)
Creatinine, Ser: 0.78 mg/dL (ref 0.57–1.00)
GFR calc Af Amer: 87 mL/min/{1.73_m2} (ref >59)
GFR calc non Af Amer: 76 mL/min/{1.73_m2} (ref >59)
Globulin, Total: 3 g/dL (ref 1.5–4.5)
Glucose: 104 mg/dL — ABNORMAL HIGH (ref 65–99)
Potassium: 4.2 mmol/L (ref 3.5–5.2)
Sodium: 139 mmol/L (ref 134–144)
Total Protein: 7.3 g/dL (ref 6.0–8.5)

## 2019-10-27 LAB — TSH: TSH: 4.56 u[IU]/mL — ABNORMAL HIGH (ref 0.450–4.500)

## 2019-10-27 LAB — LIPID PANEL
Chol/HDL Ratio: 3.8 ratio (ref 0.0–4.4)
Cholesterol, Total: 212 mg/dL — ABNORMAL HIGH (ref 100–199)
HDL: 56 mg/dL (ref >39)
LDL Chol Calc (NIH): 127 mg/dL — ABNORMAL HIGH (ref 0–99)
Triglycerides: 166 mg/dL — ABNORMAL HIGH (ref 0–149)
VLDL Cholesterol Cal: 29 mg/dL (ref 5–40)

## 2019-10-27 LAB — T4, FREE: Free T4: 1.32 ng/dL (ref 0.82–1.77)

## 2019-10-29 ENCOUNTER — Telehealth: Payer: Self-pay

## 2019-10-29 NOTE — Telephone Encounter (Signed)
Patient was called and a voicemail was left informing patient to return phone call for lab results. 

## 2019-10-29 NOTE — Telephone Encounter (Signed)
-----   Message from Charlott Rakes, MD sent at 10/28/2019  5:48 PM EDT ----- Cholesterol is elevated compared to last set of labs, She is already on the maximum dose of her Cholesterol medication. Please advise to comply with a low cholesterol diet and exercise. Other labs are stable.

## 2019-11-15 MED FILL — glipiZIDE XL 10 MG TB24: 10 | 30 days supply | Qty: 60 | Fill #0

## 2019-11-15 MED FILL — LEVOTHYROXINE 137 MCG TAB: 137 | 30 days supply | Qty: 30 | Fill #0

## 2019-11-15 MED FILL — METFORMIN HCL 1000 MG TABS: 1000 | 30 days supply | Qty: 60 | Fill #1

## 2019-11-15 MED FILL — MELOXICAM 7.5 MG TABLET: 7.5 | 30 days supply | Qty: 30 | Fill #1

## 2019-11-15 MED FILL — LISINOPRIL-HYDROCHLOROTHIAZ: 20-25 | 30 days supply | Qty: 30 | Fill #0

## 2019-11-15 MED FILL — AMLODIPINE BESYLATE 10 MG T: 10 | 30 days supply | Qty: 30 | Fill #0

## 2019-11-15 MED FILL — ATORVASTATIN CALCIUM 80 MG: 80 | 30 days supply | Qty: 30 | Fill #0

## 2019-11-16 MED FILL — PIOGLITAZONE HCL 45 MG TABS: 45 | 30 days supply | Qty: 30 | Fill #0

## 2019-12-16 MED FILL — ATORVASTATIN CALCIUM 80 MG: 80 | 30 days supply | Qty: 30 | Fill #1

## 2019-12-16 MED FILL — PIOGLITAZONE HCL 45 MG TABS: 45 | 30 days supply | Qty: 30 | Fill #1

## 2019-12-16 MED FILL — glipiZIDE XL 10 MG TB24: 10 | 30 days supply | Qty: 60 | Fill #1

## 2019-12-16 MED FILL — LISINOPRIL-HYDROCHLOROTHIAZ: 20-25 | 30 days supply | Qty: 30 | Fill #1

## 2019-12-16 MED FILL — METFORMIN HCL 1000 MG TABS: 1000 | 30 days supply | Qty: 60 | Fill #2

## 2019-12-16 MED FILL — MELOXICAM 7.5 MG TABLET: 7.5 | 30 days supply | Qty: 30 | Fill #2

## 2019-12-16 MED FILL — LEVOTHYROXINE 137 MCG TAB: 137 | 30 days supply | Qty: 30 | Fill #1

## 2019-12-16 MED FILL — AMLODIPINE BESYLATE 10 MG T: 10 | 30 days supply | Qty: 30 | Fill #1

## 2020-01-11 ENCOUNTER — Other Ambulatory Visit: Payer: Self-pay | Admitting: Family Medicine

## 2020-01-11 DIAGNOSIS — M1712 Unilateral primary osteoarthritis, left knee: Secondary | ICD-10-CM

## 2020-01-11 MED FILL — METFORMIN HCL 1000 MG TABS: 1000 | 30 days supply | Qty: 60 | Fill #3

## 2020-01-11 MED FILL — ATORVASTATIN CALCIUM 80 MG: 80 | 30 days supply | Qty: 30 | Fill #2

## 2020-01-11 MED FILL — PIOGLITAZONE HCL 45 MG TABS: 45 | 30 days supply | Qty: 30 | Fill #2

## 2020-01-11 MED FILL — LISINOPRIL-HYDROCHLOROTHIAZ: 20-25 | 30 days supply | Qty: 30 | Fill #2

## 2020-01-11 MED FILL — glipiZIDE XL 10 MG TB24: 10 | 30 days supply | Qty: 60 | Fill #2

## 2020-01-11 MED FILL — LEVOTHYROXINE 137 MCG TAB: 137 | 30 days supply | Qty: 30 | Fill #2

## 2020-01-11 MED FILL — AMLODIPINE BESYLATE 10 MG T: 10 | 30 days supply | Qty: 30 | Fill #2

## 2020-01-11 NOTE — Telephone Encounter (Signed)
Requested Prescriptions  Pending Prescriptions Disp Refills  . meloxicam (MOBIC) 7.5 MG tablet [Pharmacy Med Name: MELOXICAM 7.5 MG TABLET 7.5 Tablet] 90 tablet 1    Sig: TAKE 1 TABLET BY MOUTH DAILY.     Analgesics:  COX2 Inhibitors Passed - 01/11/2020  3:47 PM      Passed - HGB in normal range and within 360 days    Hemoglobin  Date Value Ref Range Status  03/09/2019 13.6 12.0 - 15.0 g/dL Final  44/31/5400 86.7 11.1 - 15.9 g/dL Final         Passed - Cr in normal range and within 360 days    Creat  Date Value Ref Range Status  02/19/2016 0.74 0.50 - 0.99 mg/dL Final    Comment:      For patients > or = 73 years of age: The upper reference limit for Creatinine is approximately 13% higher for people identified as African-American.      Creatinine, Ser  Date Value Ref Range Status  10/26/2019 0.78 0.57 - 1.00 mg/dL Final   Creatinine, Urine  Date Value Ref Range Status  02/19/2016 296 20 - 320 mg/dL Final         Passed - Patient is not pregnant      Passed - Valid encounter within last 12 months    Recent Outpatient Visits          2 months ago Type 2 diabetes mellitus with hyperglycemia, with long-term current use of insulin (HCC)   [facility] [place], [place], MD   5 months ago Type 2 diabetes mellitus with hyperglycemia, with long-term current use of insulin (HCC)   [facility] [place], [place], MD   8 months ago Type 2 diabetes mellitus with hyperglycemia, with long-term current use of insulin (HCC)   [facility] [place], [place], MD   9 months ago Type 2 diabetes mellitus with hyperglycemia, with long-term current use of insulin (HCC)   [facility] [place], [place], MD   11 months ago Type 2 diabetes mellitus with hyperglycemia, without long-term current use of insulin [facility])   [facility] [place], Cornelius Moras,  RPH-CPP

## 2020-01-12 MED FILL — MELOXICAM 7.5 MG TABLET: 7.5 | 30 days supply | Qty: 30 | Fill #0

## 2020-02-08 MED FILL — AMLODIPINE BESYLATE 10 MG T: 10 | 30 days supply | Qty: 30 | Fill #3

## 2020-02-08 MED FILL — LEVOTHYROXINE 137 MCG TAB: 137 | 30 days supply | Qty: 30 | Fill #3

## 2020-02-08 MED FILL — LISINOPRIL-HYDROCHLOROTHIAZ: 20-25 | 30 days supply | Qty: 30 | Fill #3

## 2020-02-08 MED FILL — glipiZIDE XL 10 MG TB24: 10 | 30 days supply | Qty: 60 | Fill #3

## 2020-02-08 MED FILL — MELOXICAM 7.5 MG TABLET: 7.5 | 30 days supply | Qty: 30 | Fill #1

## 2020-02-08 MED FILL — METFORMIN HCL 1000 MG TABS: 1000 | 30 days supply | Qty: 60 | Fill #4

## 2020-02-08 MED FILL — ATORVASTATIN CALCIUM 80 MG: 80 | 30 days supply | Qty: 30 | Fill #3

## 2020-02-09 MED FILL — $FARXIGA 5 MG TABLET: 5 | 30 days supply | Qty: 30 | Fill #0

## 2020-03-14 ENCOUNTER — Other Ambulatory Visit: Payer: Self-pay | Admitting: Family Medicine

## 2020-03-14 DIAGNOSIS — E039 Hypothyroidism, unspecified: Secondary | ICD-10-CM

## 2020-03-14 MED FILL — $FARXIGA 5 MG TABLET: 5 | 30 days supply | Qty: 30 | Fill #1

## 2020-03-14 MED FILL — ATORVASTATIN CALCIUM 80 MG: 80 | 30 days supply | Qty: 30 | Fill #4

## 2020-03-14 MED FILL — LEVOTHYROXINE 137 MCG TAB: 137 | 30 days supply | Qty: 30 | Fill #0

## 2020-03-14 MED FILL — AMLODIPINE BESYLATE 10 MG T: 10 | 30 days supply | Qty: 30 | Fill #4

## 2020-03-14 MED FILL — LISINOPRIL-HYDROCHLOROTHIAZ: 20-25 | 30 days supply | Qty: 30 | Fill #4

## 2020-03-14 MED FILL — PIOGLITAZONE HCL 45 MG TABS: 45 | 30 days supply | Qty: 30 | Fill #3

## 2020-03-14 MED FILL — METFORMIN HCL 1000 MG TABS: 1000 | 30 days supply | Qty: 60 | Fill #5

## 2020-03-14 MED FILL — MELOXICAM 7.5 MG TABLET: 7.5 | 30 days supply | Qty: 30 | Fill #2

## 2020-03-14 MED FILL — glipiZIDE XL 10 MG TB24: 10 | 30 days supply | Qty: 60 | Fill #4

## 2020-04-13 MED FILL — $FARXIGA 5 MG TABLET: 5 | 30 days supply | Qty: 30 | Fill #2

## 2020-04-13 MED FILL — PIOGLITAZONE HCL 45 MG TABS: 45 | 30 days supply | Qty: 30 | Fill #4

## 2020-04-13 MED FILL — glipiZIDE XL 10 MG TB24: 10 | 30 days supply | Qty: 60 | Fill #5

## 2020-04-13 MED FILL — MELOXICAM 7.5 MG TABLET: 7.5 | 30 days supply | Qty: 30 | Fill #3

## 2020-04-13 MED FILL — AMLODIPINE BESYLATE 10 MG T: 10 | 30 days supply | Qty: 30 | Fill #5

## 2020-04-13 MED FILL — ATORVASTATIN CALCIUM 80 MG: 80 | 30 days supply | Qty: 30 | Fill #5

## 2020-04-13 MED FILL — LEVOTHYROXINE 137 MCG TAB: 137 | 30 days supply | Qty: 30 | Fill #1

## 2020-04-13 MED FILL — LISINOPRIL-HYDROCHLOROTHIAZ: 20-25 | 30 days supply | Qty: 30 | Fill #5

## 2020-04-15 ENCOUNTER — Other Ambulatory Visit: Payer: Self-pay

## 2020-05-16 ENCOUNTER — Other Ambulatory Visit: Payer: Self-pay | Admitting: Family Medicine

## 2020-05-16 ENCOUNTER — Other Ambulatory Visit: Payer: Self-pay

## 2020-05-16 DIAGNOSIS — E039 Hypothyroidism, unspecified: Secondary | ICD-10-CM

## 2020-05-16 MED ORDER — LEVOTHYROXINE SODIUM 137 MCG PO TABS
ORAL_TABLET | ORAL | 0 refills | Status: DC
  Filled 2020-05-16: qty 30, 30d supply, fill #0

## 2020-05-16 MED FILL — Dapagliflozin Propanediol Tab 5 MG (Base Equivalent): ORAL | 30 days supply | Qty: 30 | Fill #0 | Status: AC

## 2020-05-16 MED FILL — Lisinopril & Hydrochlorothiazide Tab 20-25 MG: ORAL | 30 days supply | Qty: 30 | Fill #0 | Status: AC

## 2020-05-16 MED FILL — Meloxicam Tab 7.5 MG: ORAL | 30 days supply | Qty: 30 | Fill #0 | Status: AC

## 2020-05-16 MED FILL — Pioglitazone HCl Tab 45 MG (Base Equiv): ORAL | 30 days supply | Qty: 30 | Fill #0 | Status: AC

## 2020-05-16 MED FILL — Atorvastatin Calcium Tab 80 MG (Base Equivalent): ORAL | 30 days supply | Qty: 30 | Fill #0 | Status: AC

## 2020-05-16 MED FILL — Glipizide Tab ER 24HR 10 MG: ORAL | 30 days supply | Qty: 30 | Fill #0 | Status: AC

## 2020-05-16 MED FILL — Amlodipine Besylate Tab 10 MG (Base Equivalent): ORAL | 30 days supply | Qty: 30 | Fill #0 | Status: AC

## 2020-05-16 NOTE — Telephone Encounter (Signed)
Requested medication (s) are due for refill today: yes  Requested medication (s) are on the active medication list: yes  Last refill:  03/14/20 #30 1 RF  Future visit scheduled: yes  Notes to clinic:  overdue lab work- has upcoming appt   Requested Prescriptions  Pending Prescriptions Disp Refills   levothyroxine (SYNTHROID) 137 MCG tablet 30 tablet 1    Sig: TAKE 1 TABLET (137 MCG TOTAL) BY MOUTH DAILY.      Endocrinology:  Hypothyroid Agents Failed - 05/16/2020 12:17 PM      Failed - TSH needs to be rechecked within 3 months after an abnormal result. Refill until TSH is due.      Failed - TSH in normal range and within 360 days    TSH  Date Value Ref Range Status  10/26/2019 4.560 (H) 0.450 - 4.500 uIU/mL Final          Passed - Valid encounter within last 12 months    Recent Outpatient Visits           6 months ago Type 2 diabetes mellitus with hyperglycemia, with long-term current use of insulin ([place])   [place], [place], MD   9 months ago Type 2 diabetes mellitus with hyperglycemia, with long-term current use of insulin ([place])   [place], [place], MD   1 year ago Type 2 diabetes mellitus with hyperglycemia, with long-term current use of insulin ([place])   [place], [place], MD   1 year ago Type 2 diabetes mellitus with hyperglycemia, with long-term current use of insulin ([place])   [place], Charlane Ferretti, MD   1 year ago Type 2 diabetes mellitus with hyperglycemia, without long-term current use of insulin [facility])   [place], RPH-CPP       Future Appointments             In 1 week Charlott Rakes, MD [place]

## 2020-05-17 ENCOUNTER — Other Ambulatory Visit: Payer: Self-pay

## 2020-05-24 ENCOUNTER — Encounter: Payer: Self-pay | Admitting: Family Medicine

## 2020-05-24 ENCOUNTER — Other Ambulatory Visit: Payer: Self-pay

## 2020-05-24 ENCOUNTER — Ambulatory Visit: Payer: Self-pay | Attending: Family Medicine | Admitting: Family Medicine

## 2020-05-24 VITALS — BP 127/73 | HR 74 | Ht 60.0 in | Wt 157.8 lb

## 2020-05-24 DIAGNOSIS — E1165 Type 2 diabetes mellitus with hyperglycemia: Secondary | ICD-10-CM

## 2020-05-24 DIAGNOSIS — E1149 Type 2 diabetes mellitus with other diabetic neurological complication: Secondary | ICD-10-CM

## 2020-05-24 DIAGNOSIS — E785 Hyperlipidemia, unspecified: Secondary | ICD-10-CM

## 2020-05-24 DIAGNOSIS — E1169 Type 2 diabetes mellitus with other specified complication: Secondary | ICD-10-CM

## 2020-05-24 DIAGNOSIS — G4709 Other insomnia: Secondary | ICD-10-CM

## 2020-05-24 DIAGNOSIS — H11003 Unspecified pterygium of eye, bilateral: Secondary | ICD-10-CM

## 2020-05-24 DIAGNOSIS — M1712 Unilateral primary osteoarthritis, left knee: Secondary | ICD-10-CM

## 2020-05-24 DIAGNOSIS — I10 Essential (primary) hypertension: Secondary | ICD-10-CM

## 2020-05-24 DIAGNOSIS — Z794 Long term (current) use of insulin: Secondary | ICD-10-CM

## 2020-05-24 DIAGNOSIS — E039 Hypothyroidism, unspecified: Secondary | ICD-10-CM

## 2020-05-24 DIAGNOSIS — Z1211 Encounter for screening for malignant neoplasm of colon: Secondary | ICD-10-CM

## 2020-05-24 LAB — POCT GLYCOSYLATED HEMOGLOBIN (HGB A1C): HbA1c, POC (controlled diabetic range): 7.4 % — AB (ref 0.0–7.0)

## 2020-05-24 LAB — GLUCOSE, POCT (MANUAL RESULT ENTRY): POC Glucose: 93 mg/dl (ref 70–99)

## 2020-05-24 MED ORDER — METFORMIN HCL 1000 MG PO TABS
1000.0000 mg | ORAL_TABLET | Freq: Two times a day (BID) | ORAL | 6 refills | Status: DC
  Filled 2020-05-24: qty 60, 30d supply, fill #0
  Filled 2020-08-11: qty 60, 30d supply, fill #1
  Filled 2020-09-20: qty 60, 30d supply, fill #2
  Filled 2020-10-27: qty 60, 30d supply, fill #3
  Filled 2020-12-15: qty 60, 30d supply, fill #4
  Filled 2021-01-26: qty 60, 30d supply, fill #0
  Filled 2021-03-16: qty 60, 30d supply, fill #1

## 2020-05-24 MED ORDER — AMLODIPINE BESYLATE 10 MG PO TABS
ORAL_TABLET | Freq: Every day | ORAL | 6 refills | Status: DC
Start: 2020-05-24 — End: 2021-04-26
  Filled 2020-05-24: qty 30, fill #0
  Filled 2020-07-03: qty 30, 30d supply, fill #0
  Filled 2020-08-11: qty 30, 30d supply, fill #1
  Filled 2020-09-20: qty 30, 30d supply, fill #2
  Filled 2020-10-27: qty 30, 30d supply, fill #3
  Filled 2020-12-15: qty 30, 30d supply, fill #4
  Filled 2021-01-26: qty 30, 30d supply, fill #0
  Filled 2021-03-16: qty 30, 30d supply, fill #1

## 2020-05-24 MED ORDER — TRAZODONE HCL 50 MG PO TABS
25.0000 mg | ORAL_TABLET | Freq: Every evening | ORAL | 3 refills | Status: DC | PRN
  Filled 2020-05-24: qty 30, 30d supply, fill #0
  Filled 2020-07-03: qty 30, 30d supply, fill #1
  Filled 2020-08-11: qty 30, 30d supply, fill #2

## 2020-05-24 MED ORDER — PIOGLITAZONE HCL 45 MG PO TABS
45.0000 mg | ORAL_TABLET | Freq: Every day | ORAL | 6 refills | Status: DC
  Filled 2020-05-24 – 2020-07-03 (×2): qty 30, 30d supply, fill #0
  Filled 2020-08-11: qty 30, 30d supply, fill #1
  Filled 2020-09-20: qty 30, 30d supply, fill #2
  Filled 2020-10-27: qty 30, 30d supply, fill #3
  Filled 2020-12-15: qty 30, 30d supply, fill #4
  Filled 2021-01-26: qty 30, 30d supply, fill #0
  Filled 2021-03-16: qty 30, 30d supply, fill #1

## 2020-05-24 MED ORDER — MELOXICAM 15 MG PO TABS
15.0000 mg | ORAL_TABLET | Freq: Every day | ORAL | 1 refills | Status: AC
  Filled 2020-05-24: qty 30, 30d supply, fill #0
  Filled 2020-07-03: qty 90, 90d supply, fill #0

## 2020-05-24 MED ORDER — OLOPATADINE HCL 0.1 % OP SOLN
1.0000 [drp] | Freq: Two times a day (BID) | OPHTHALMIC | 1 refills | Status: DC
  Filled 2020-05-24: qty 5, 25d supply, fill #0

## 2020-05-24 MED ORDER — DAPAGLIFLOZIN PROPANEDIOL 5 MG PO TABS
ORAL_TABLET | Freq: Every day | ORAL | 1 refills | Status: DC
  Filled 2020-05-24: qty 90, fill #0
  Filled 2020-07-03: qty 30, 30d supply, fill #0
  Filled 2020-08-11: qty 30, 30d supply, fill #1
  Filled 2020-09-20: qty 90, 90d supply, fill #2
  Filled 2020-10-27: qty 30, 30d supply, fill #3
  Filled 2021-01-26: qty 30, 30d supply, fill #0

## 2020-05-24 MED ORDER — ATORVASTATIN CALCIUM 80 MG PO TABS
ORAL_TABLET | Freq: Every day | ORAL | 6 refills | Status: DC
  Filled 2020-05-24: qty 30, fill #0
  Filled 2020-07-03: qty 30, 30d supply, fill #0
  Filled 2020-08-11: qty 30, 30d supply, fill #1
  Filled 2020-09-20: qty 30, 30d supply, fill #2
  Filled 2020-10-27: qty 30, 30d supply, fill #3
  Filled 2020-12-15: qty 30, 30d supply, fill #4
  Filled 2021-01-26: qty 30, 30d supply, fill #0
  Filled 2021-03-16: qty 30, 30d supply, fill #1

## 2020-05-24 MED ORDER — LISINOPRIL-HYDROCHLOROTHIAZIDE 20-25 MG PO TABS
1.0000 | ORAL_TABLET | Freq: Every day | ORAL | 6 refills | Status: DC
  Filled 2020-05-24 – 2020-07-03 (×2): qty 30, 30d supply, fill #0
  Filled 2020-08-11: qty 30, 30d supply, fill #1
  Filled 2020-09-20: qty 30, 30d supply, fill #2
  Filled 2020-10-27: qty 30, 30d supply, fill #3
  Filled 2020-12-15: qty 30, 30d supply, fill #4
  Filled 2021-01-26: qty 30, 30d supply, fill #0
  Filled 2021-03-16: qty 30, 30d supply, fill #1

## 2020-05-24 MED ORDER — GLIPIZIDE ER 10 MG PO TB24
ORAL_TABLET | Freq: Every day | ORAL | 6 refills | Status: DC
Start: 2020-05-24 — End: 2021-04-26
  Filled 2020-05-24: qty 60, fill #0
  Filled 2020-07-03: qty 60, 30d supply, fill #0
  Filled 2020-08-11: qty 60, 30d supply, fill #1
  Filled 2020-09-20: qty 60, 30d supply, fill #2
  Filled 2020-10-27: qty 60, 30d supply, fill #3
  Filled 2020-12-15: qty 60, 30d supply, fill #4
  Filled 2021-01-26: qty 60, 30d supply, fill #0
  Filled 2021-03-16: qty 60, 30d supply, fill #1

## 2020-05-24 MED ORDER — GABAPENTIN 300 MG PO CAPS
300.0000 mg | ORAL_CAPSULE | Freq: Every day | ORAL | 6 refills | Status: DC
  Filled 2020-05-24: qty 30, 30d supply, fill #0
  Filled 2020-07-03: qty 30, 30d supply, fill #1

## 2020-05-24 NOTE — Progress Notes (Signed)
Still having pain in bones.

## 2020-05-24 NOTE — Progress Notes (Signed)
Subjective:  Patient ID: Linda House, female    DOB: February 11, 1945  Age: 75 y.o. MRN: [id number]  CC: Diabetes   HPI Linda House  is a 75 year old female with a history of type 2 diabetes mellitus (A1c7.4), hypothyroidism, hyperlipidemia, hypertension who presents today for chronic disease management She has pain in her knees and is on meloxicam but is wondering if the dose can be increased. She uses it as needed for pain.  Today she has severe pain in the posterior medial aspect of her left knee. She complains of insomnia and is up till 4-5 am; denies caffeine intake.  Would like something for insomnia.  Diabetes is controlled with no hypoglycemia, blurry vision or numbness in extremities.  She does endorse the presence of dry eyes and is wondering if she can receive drops for this. Doing well on her antihypertensive and her statin and has no adverse effects of her medication. Also compliant with her levothyroxine for hypothyroidism.  Past Medical History:  Diagnosis Date  . Diabetes mellitus   . Diabetes mellitus without complication ([place])   . Hypertension     History reviewed. No pertinent surgical history.  Family History  Problem Relation Age of Onset  . Diabetes Father   . Hypertension Father     No Known Allergies  Outpatient Medications Prior to Visit  Medication Sig Dispense Refill  . amoxicillin (AMOXIL) 500 MG capsule Take 1 capsule (500 mg total) by mouth 3 (three) times daily. (Patient not taking: No sig reported) 30 capsule 0  . aspirin EC 81 MG tablet Take 1 tablet (81 mg total) by mouth daily. 30 tablet 3  . Blood Glucose Monitoring Suppl (TRUE METRIX METER) w/Device KIT Use to check blood sugar daily. E11.65 1 kit 0  . cetirizine (ZYRTEC) 10 MG tablet Take 1 tablet (10 mg total) by mouth daily. (Patient not taking: No sig reported) 30 tablet 1  . cyclobenzaprine (FLEXERIL) 10 MG tablet TAKE 1 TABLET BY MOUTH AT BEDTIME. 30 tablet 0   . diclofenac Sodium (VOLTAREN) 1 % GEL Apply 4 g topically 4 (four) times daily. 150 g 1  . glucose blood test strip Use as instructed to check blood sugar daily. E11.65 100 each 11  . levothyroxine (SYNTHROID) 137 MCG tablet TAKE 1 TABLET (137 MCG TOTAL) BY MOUTH DAILY. 30 tablet 0  . meclizine (ANTIVERT) 12.5 MG tablet Take 1 tablet (12.5 mg total) by mouth 2 (two) times daily. (Patient not taking: Reported on 05/04/2019) 30 tablet 0  . metFORMIN (GLUCOPHAGE) 1000 MG tablet TAKE 1 TABLET (1,000 MG TOTAL) BY MOUTH 2 (TWO) TIMES DAILY WITH A MEAL. 60 tablet 6  . pioglitazone (ACTOS) 45 MG tablet TAKE 1 TABLET (45 MG TOTAL) BY MOUTH DAILY. 30 tablet 6  . TRUEplus Lancets 28G MISC Use to check blood sugar daily. E11.65 100 each 11  . amLODipine (NORVASC) 10 MG tablet TAKE 1 TABLET (10 MG TOTAL) BY MOUTH DAILY. 30 tablet 6  . atorvastatin (LIPITOR) 80 MG tablet TAKE 1 TABLET (80 MG TOTAL) BY MOUTH DAILY. 30 tablet 6  . dapagliflozin propanediol (FARXIGA) 5 MG TABS tablet TAKE 1 TABLET (5 MG TOTAL) BY MOUTH DAILY BEFORE BREAKFAST. 90 tablet 1  . gabapentin (NEURONTIN) 300 MG capsule Take 1 capsule (300 mg total) by mouth at bedtime. 30 capsule 6  . glipiZIDE (GLUCOTROL XL) 10 MG 24 hr tablet TAKE 2 TABLETS (20 MG TOTAL) BY MOUTH DAILY. 60 tablet 6  . lisinopril-hydrochlorothiazide (ZESTORETIC) 20-25  MG tablet TAKE 1 TABLET BY MOUTH DAILY. 30 tablet 6  . meloxicam (MOBIC) 7.5 MG tablet TAKE 1 TABLET BY MOUTH DAILY. 90 tablet 1  . metFORMIN (GLUCOPHAGE) 1000 MG tablet Take 1 tablet (1,000 mg total) by mouth 2 (two) times daily with a meal. 60 tablet 6  . olopatadine (PATANOL) 0.1 % ophthalmic solution Place 1 drop into both eyes 2 (two) times daily. 5 mL 1  . pioglitazone (ACTOS) 45 MG tablet Take 1 tablet (45 mg total) by mouth daily. 30 tablet 6   No facility-administered medications prior to visit.     ROS Review of Systems  Constitutional: Negative for activity change, appetite change and  fatigue.  HENT: Negative for congestion, sinus pressure and sore throat.   Eyes: Negative for visual disturbance.  Respiratory: Negative for cough, chest tightness, shortness of breath and wheezing.   Cardiovascular: Negative for chest pain and palpitations.  Gastrointestinal: Negative for abdominal distention, abdominal pain and constipation.  Endocrine: Negative for polydipsia.  Genitourinary: Negative for dysuria and frequency.  Musculoskeletal: Positive for back pain.       See HPI  Skin: Negative for rash.  Neurological: Negative for tremors, light-headedness and numbness.  Hematological: Does not bruise/bleed easily.  Psychiatric/Behavioral: Positive for sleep disturbance. Negative for agitation and behavioral problems.    Objective:  BP 127/73   Pulse 74   Ht 5' (1.524 m)   Wt 157 lb 12.8 oz (71.6 kg)   SpO2 97%   BMI 30.82 kg/m   BP/Weight 05/24/2020 10/26/2019 3/00/7622  Systolic BP 633 354 562  Diastolic BP 73 67 73  Wt. (Lbs) 157.8 162.6 163.8  BMI 30.82 31.76 31.99      Physical Exam Constitutional:      Appearance: She is well-developed.  Neck:     Vascular: No JVD.  Cardiovascular:     Rate and Rhythm: Normal rate.     Heart sounds: Normal heart sounds. No murmur heard.   Pulmonary:     Effort: Pulmonary effort is normal.     Breath sounds: Normal breath sounds. No wheezing or rales.  Chest:     Chest wall: No tenderness.  Abdominal:     General: Bowel sounds are normal. There is no distension.     Palpations: Abdomen is soft. There is no mass.     Tenderness: There is no abdominal tenderness.  Musculoskeletal:        General: Normal range of motion.     Right lower leg: No edema.     Left lower leg: No edema.     Comments: Tenderness on palpation of inferomedial aspect of left knee  Neurological:     Mental Status: She is alert and oriented to person, place, and time.  Psychiatric:        Mood and Affect: Mood normal.     CMP Latest Ref  Rng & Units 10/26/2019 05/04/2019 03/09/2019  Glucose 65 - 99 mg/dL 104(H) 101(H) 70  BUN 8 - 27 mg/dL 11 13 31(H)  Creatinine 0.57 - 1.00 mg/dL 0.78 0.65 0.84  Sodium 134 - 144 mmol/L 139 141 136  Potassium 3.5 - 5.2 mmol/L 4.2 4.6 3.3(L)  Chloride 96 - 106 mmol/L 97 100 103  CO2 20 - 29 mmol/L _0 Calcium 8.7 - 10.3 mg/dL 9.4 9.8 9.5  Total Protein 6.0 - 8.5 g/dL 7.3 - 7.5  Total Bilirubin 0.0 - 1.2 mg/dL 0.4 - 0.3  Alkaline Phos 44 - 121 IU/L  173(H) - 126  AST 0 - 40 IU/L 16 - 21  ALT 0 - 32 IU/L 18 - 21    Lipid Panel     Component Value Date/Time   CHOL 212 (H) 10/26/2019 0917   TRIG 166 (H) 10/26/2019 0917   HDL 56 10/26/2019 0917   CHOLHDL 3.8 10/26/2019 0917   CHOLHDL 3.4 02/19/2016 1021   VLDL 25 08/29/2015 0849   LDLCALC 127 (H) 10/26/2019 0917    CBC    Component Value Date/Time   WBC 13.4 (H) 03/09/2019 1951   RBC 5.30 (H) 03/09/2019 1951   HGB 13.6 03/09/2019 1951   HGB 12.2 08/27/2016 0919   HCT 43.4 03/09/2019 1951   HCT 38.2 08/27/2016 0919   PLT 384 03/09/2019 1951   PLT 355 08/27/2016 0919   MCV 81.9 03/09/2019 1951   MCV 79 08/27/2016 0919   MCH 25.7 (L) 03/09/2019 1951   MCHC 31.3 03/09/2019 1951   RDW 14.5 03/09/2019 1951   RDW 15.2 08/27/2016 0919   LYMPHSABS 6.4 (H) 03/09/2019 1951   LYMPHSABS 2.5 08/27/2016 0919   MONOABS 0.7 03/09/2019 1951   EOSABS 0.3 03/09/2019 1951   EOSABS 0.2 08/27/2016 0919   BASOSABS 0.0 03/09/2019 1951   BASOSABS 0.0 08/27/2016 0919    Lab Results  Component Value Date   HGBA1C 7.4 (A) 05/24/2020    Assessment & Plan:  1. Type 2 diabetes mellitus with hyperglycemia, with long-term current use of insulin (HCC) Controlled with A1c of 7.4; goal is less than 7.5 due to her age and comorbidities Continue current regimen Counseled on Diabetic diet, my plate method, 765 minutes of moderate intensity exercise/week Blood sugar logs with fasting goals of 80-120 mg/dl, random of less than 180 and in the event  of sugars less than 60 mg/dl or greater than 400 mg/dl encouraged to notify the clinic. Advised on the need for annual eye exams, annual foot exams, Pneumonia vaccine. - POCT glucose (manual entry) - POCT glycosylated hemoglobin (Hb A1C) - CMP14+EGFR - Lipid panel - Microalbumin / creatinine urine ratio - dapagliflozin propanediol (FARXIGA) 5 MG TABS tablet; TAKE 1 TABLET (5 MG TOTAL) BY MOUTH DAILY BEFORE BREAKFAST.  Dispense: 90 tablet; Refill: 1  2. Primary osteoarthritis of left knee Uncontrolled Increased dose of meloxicam and she has been advised to use this as needed Advised to apply ice and use a knee brace - meloxicam (MOBIC) 15 MG tablet; Take 1 tablet (15 mg total) by mouth daily.  Dispense: 90 tablet; Refill: 1  3. Type 2 diabetes mellitus with other neurologic complication, without long-term current use of insulin (HCC) Neuropathy is controlled - gabapentin (NEURONTIN) 300 MG capsule; Take 1 capsule (300 mg total) by mouth at bedtime.  Dispense: 30 capsule; Refill: 6 - glipiZIDE (GLUCOTROL XL) 10 MG 24 hr tablet; TAKE 2 TABLETS (20 MG TOTAL) BY MOUTH DAILY.  Dispense: 60 tablet; Refill: 6 - metFORMIN (GLUCOPHAGE) 1000 MG tablet; Take 1 tablet (1,000 mg total) by mouth 2 (two) times daily with a meal.  Dispense: 60 tablet; Refill: 6 - pioglitazone (ACTOS) 45 MG tablet; Take 1 tablet (45 mg total) by mouth daily.  Dispense: 30 tablet; Refill: 6  4. Essential hypertension, benign Controlled Continue current regimen Counseled on blood pressure goal of less than 130/80, low-sodium, DASH diet, medication compliance, 150 minutes of moderate intensity exercise per week. Discussed medication compliance, adverse effects. - amLODipine (NORVASC) 10 MG tablet; TAKE 1 TABLET (10 MG TOTAL) BY MOUTH DAILY.  Dispense:  30 tablet; Refill: 6 - lisinopril-hydrochlorothiazide (ZESTORETIC) 20-25 MG tablet; TAKE 1 TABLET BY MOUTH DAILY.  Dispense: 30 tablet; Refill: 6  5. Hyperlipidemia associated  with type 2 diabetes mellitus (HCC) Controlled Low-cholesterol diet - atorvastatin (LIPITOR) 80 MG tablet; TAKE 1 TABLET (80 MG TOTAL) BY MOUTH DAILY.  Dispense: 30 tablet; Refill: 6  6. Pterygium of both eyes Due to dryness in eyes I will refill her Patanol eyedrops - olopatadine (PATANOL) 0.1 % ophthalmic solution; Place 1 drop into both eyes 2 (two) times daily.  Dispense: 5 mL; Refill: 1  7. Hypothyroidism, unspecified type Will send of thyroid labs and adjust levothyroxine accordingly - TSH - T4, free  8. Screening for colon cancer - Fecal occult blood, imunochemical([facility])  9. Other insomnia Counseled on sleep hygiene - traZODone (DESYREL) 50 MG tablet; Take 0.5-1 tablets (25-50 mg total) by mouth at bedtime as needed for sleep.  Dispense: 30 tablet; Refill: 3    45 minutes of total face to face time spent and greater than 50% of time spent on discussing diagnosis, investigations, treatment plan, counseling on sleep hygiene, management of chronic knee pain, coordination of care.  Meds ordered this encounter  Medications  . meloxicam (MOBIC) 15 MG tablet    Sig: Take 1 tablet (15 mg total) by mouth daily.    Dispense:  90 tablet    Refill:  1  . gabapentin (NEURONTIN) 300 MG capsule    Sig: Take 1 capsule (300 mg total) by mouth at bedtime.    Dispense:  30 capsule    Refill:  6  . amLODipine (NORVASC) 10 MG tablet    Sig: TAKE 1 TABLET (10 MG TOTAL) BY MOUTH DAILY.    Dispense:  30 tablet    Refill:  6  . atorvastatin (LIPITOR) 80 MG tablet    Sig: TAKE 1 TABLET (80 MG TOTAL) BY MOUTH DAILY.    Dispense:  30 tablet    Refill:  6  . dapagliflozin propanediol (FARXIGA) 5 MG TABS tablet    Sig: TAKE 1 TABLET (5 MG TOTAL) BY MOUTH DAILY BEFORE BREAKFAST.    Dispense:  90 tablet    Refill:  1  . glipiZIDE (GLUCOTROL XL) 10 MG 24 hr tablet    Sig: TAKE 2 TABLETS (20 MG TOTAL) BY MOUTH DAILY.    Dispense:  60 tablet    Refill:  6  .  lisinopril-hydrochlorothiazide (ZESTORETIC) 20-25 MG tablet    Sig: TAKE 1 TABLET BY MOUTH DAILY.    Dispense:  30 tablet    Refill:  6  . metFORMIN (GLUCOPHAGE) 1000 MG tablet    Sig: Take 1 tablet (1,000 mg total) by mouth 2 (two) times daily with a meal.    Dispense:  60 tablet    Refill:  6  . pioglitazone (ACTOS) 45 MG tablet    Sig: Take 1 tablet (45 mg total) by mouth daily.    Dispense:  30 tablet    Refill:  6  . olopatadine (PATANOL) 0.1 % ophthalmic solution    Sig: Place 1 drop into both eyes 2 (two) times daily.    Dispense:  5 mL    Refill:  1  . traZODone (DESYREL) 50 MG tablet    Sig: Take 0.5-1 tablets (25-50 mg total) by mouth at bedtime as needed for sleep.    Dispense:  30 tablet    Refill:  3    Follow-up: Return in about 3 months (around 08/24/2020) for Chronic  disease management.       Charlott Rakes, MD, FAAFP. [facility] [place], [place]   05/24/2020, 10:19 AM

## 2020-05-24 NOTE — Patient Instructions (Signed)
Chronic Knee Pain, Adult Knee pain that lasts longer than 3 months is called chronic knee pain. You may have pain in one or both knees. Symptoms of chronic knee pain may also include swelling and stiffness. The most common cause is age-related wear and tear (osteoarthritis) of your knee joint. Many conditions can cause chronic knee pain. Treatment depends on the cause. The main treatments are physical therapy and weight loss. It may also be treated with medicines, injections, a knee sleeve or brace, and by using crutches. Rest, ice, pressure (compression), and elevation, also known as RICE therapy, may also be recommended. Follow these instructions at home: If you have a knee sleeve or brace:  Wear the knee sleeve or brace as told by your doctor. Take it off only as told by your doctor.  Loosen it if your toes: ? Tingle. ? Become numb. ? Turn cold and blue.  Keep it clean.  If the sleeve or brace is not waterproof: ? Do not let it get wet. ? Ask your doctor if you may take it off when you take a bath or shower. If not, cover it with a watertight covering.   Managing pain, stiffness, and swelling  If told, put heat on your knee. Do this as often as told by your doctor. Use the heat source that your doctor recommends, such as a moist heat pack or a heating pad. ? If you have a removable knee sleeve or brace, take it off as told by your doctor. ? Place a towel between your skin and the heat source. ? Leave the heat on for 20-30 minutes. ? Take off the heat if your skin turns bright red. This is very important. If you cannot feel pain, heat, or cold, you have a greater risk of getting burned.  If told, put ice on your knee. To do this: ? If you have a removable knee sleeve or brace, take it off as told by your doctor. ? Put ice in a plastic bag. ? Place a towel between your skin and the bag. ? Leave the ice on for 20 minutes, 2-3 times a day. ? Take off the ice if your skin turns bright  red. This is very important. If you cannot feel pain, heat, or cold, you have a greater risk of damage to the area.  Move your toes often.  Raise the injured area above the level of your heart while you are sitting or lying down.      Activity  Avoid activities where both feet leave the ground at the same time (high-impact activities). Examples are running, jumping rope, and doing jumping jacks.  Follow the exercise plan that your doctor makes for you. Your doctor may suggest that you: ? Avoid activities that make knee pain worse. You may need to change the exercises that you do, the sports that you participate in, or your job duties. ? Wear shoes with cushioned soles. ? Avoid sports that require running and sudden changes in direction. ? Do exercises or physical therapy. This is planned to match your needs and your abilities. ? Do exercises that increase your balance and strength, such as tai chi and yoga.  Do not use your injured knee to support your body weight until your doctor says that you can. Use crutches as told by your doctor.  Return to your normal activities when your doctor says that it is safe. General instructions  Take over-the-counter and prescription medicines only as told by your   doctor.  If you are overweight, work with your doctor and a food expert (dietitian) to set goals to lose weight. Being overweight can make your knee hurt more.  Do not smoke or use any products that contain nicotine or tobacco. If you need help quitting, ask your doctor.  Keep all follow-up visits. Contact a doctor if:  You have knee pain that is not getting better or gets worse.  You are not able to do your exercises due to knee pain. Get help right away if:  Your knee swells and the swelling gets worse.  You cannot move your knee.  You have very bad knee pain. Summary  Knee pain that lasts more than 3 months is called chronic knee pain.  The main treatments for chronic knee  pain are physical therapy and weight loss. You may also need to take medicines, wear a knee sleeve or brace, use crutches, and put ice or heat on your knee.  Lose weight if you are overweight. Work with your doctor and a food expert (dietitian) to help you set goals to lose weight. Being overweight can make your knee hurt more.  Follow the exercise plan that your doctor makes for you. This information is not intended to replace advice given to you by your health care provider. Make sure you discuss any questions you have with your health care provider. Document Revised: 06/16/2019 Document Reviewed: 06/16/2019 Elsevier Patient Education  2021 [employer]  

## 2020-05-25 ENCOUNTER — Other Ambulatory Visit: Payer: Self-pay | Admitting: Family Medicine

## 2020-05-25 ENCOUNTER — Other Ambulatory Visit: Payer: Self-pay

## 2020-05-25 DIAGNOSIS — E039 Hypothyroidism, unspecified: Secondary | ICD-10-CM

## 2020-05-25 LAB — LIPID PANEL
Chol/HDL Ratio: 3.4 ratio (ref 0.0–4.4)
Cholesterol, Total: 186 mg/dL (ref 100–199)
HDL: 55 mg/dL (ref >39)
LDL Chol Calc (NIH): 107 mg/dL — ABNORMAL HIGH (ref 0–99)
Triglycerides: 137 mg/dL (ref 0–149)
VLDL Cholesterol Cal: 24 mg/dL (ref 5–40)

## 2020-05-25 LAB — MICROALBUMIN / CREATININE URINE RATIO
Creatinine, Urine: 121.2 mg/dL
Microalb/Creat Ratio: 12 mg/g creat (ref 0–29)
Microalbumin, Urine: 15.1 ug/mL

## 2020-05-25 LAB — CMP14+EGFR
ALT: 15 IU/L (ref 0–32)
AST: 17 IU/L (ref 0–40)
Albumin/Globulin Ratio: 1.4 (ref 1.2–2.2)
Albumin: 4.1 g/dL (ref 3.7–4.7)
Alkaline Phosphatase: 172 IU/L — ABNORMAL HIGH (ref 44–121)
BUN/Creatinine Ratio: 16 (ref 12–28)
BUN: 11 mg/dL (ref 8–27)
Bilirubin Total: 0.3 mg/dL (ref 0.0–1.2)
CO2: 24 mmol/L (ref 20–29)
Calcium: 9.4 mg/dL (ref 8.7–10.3)
Chloride: 104 mmol/L (ref 96–106)
Creatinine, Ser: 0.7 mg/dL (ref 0.57–1.00)
Globulin, Total: 2.9 g/dL (ref 1.5–4.5)
Glucose: 85 mg/dL (ref 65–99)
Potassium: 4.9 mmol/L (ref 3.5–5.2)
Sodium: 145 mmol/L — ABNORMAL HIGH (ref 134–144)
Total Protein: 7 g/dL (ref 6.0–8.5)
eGFR: 91 mL/min/{1.73_m2} (ref >59)

## 2020-05-25 LAB — T4, FREE: Free T4: 1.9 ng/dL — ABNORMAL HIGH (ref 0.82–1.77)

## 2020-05-25 LAB — TSH: TSH: 0.24 u[IU]/mL — ABNORMAL LOW (ref 0.450–4.500)

## 2020-05-25 MED ORDER — LEVOTHYROXINE SODIUM 125 MCG PO TABS
125.0000 ug | ORAL_TABLET | Freq: Every day | ORAL | 3 refills | Status: DC
Start: 2020-05-25 — End: 2020-10-27
  Filled 2020-05-25: qty 30, 30d supply, fill #0
  Filled 2020-07-03: qty 30, 30d supply, fill #1
  Filled 2020-08-11: qty 30, 30d supply, fill #2
  Filled 2020-09-20: qty 30, 30d supply, fill #3

## 2020-05-26 ENCOUNTER — Telehealth: Payer: Self-pay

## 2020-05-26 NOTE — Telephone Encounter (Signed)
Vm has been left informing patient of lab results.

## 2020-05-26 NOTE — Telephone Encounter (Signed)
-----   Message from Charlott Rakes, MD sent at 05/25/2020  3:05 PM EDT ----- Cholesterol has improved, continue current cholesterol medication. Thyroid level is abnormal and I have sent a decreased dose of her Levothyroxine to the Pharmacy

## 2020-05-30 ENCOUNTER — Other Ambulatory Visit: Payer: Self-pay

## 2020-06-07 ENCOUNTER — Other Ambulatory Visit: Payer: Self-pay

## 2020-06-09 LAB — HM DIABETES EYE EXAM

## 2020-07-03 ENCOUNTER — Other Ambulatory Visit: Payer: Self-pay

## 2020-07-04 ENCOUNTER — Other Ambulatory Visit: Payer: Self-pay

## 2020-08-07 ENCOUNTER — Ambulatory Visit: Payer: No Typology Code available for payment source | Admitting: Family Medicine

## 2020-08-11 ENCOUNTER — Other Ambulatory Visit: Payer: Self-pay

## 2020-08-24 ENCOUNTER — Ambulatory Visit: Payer: Self-pay | Attending: Family Medicine | Admitting: Family Medicine

## 2020-09-20 ENCOUNTER — Other Ambulatory Visit: Payer: Self-pay

## 2020-10-27 ENCOUNTER — Other Ambulatory Visit: Payer: Self-pay | Admitting: Family Medicine

## 2020-10-27 ENCOUNTER — Other Ambulatory Visit: Payer: Self-pay

## 2020-10-27 DIAGNOSIS — E039 Hypothyroidism, unspecified: Secondary | ICD-10-CM

## 2020-10-28 MED ORDER — LEVOTHYROXINE SODIUM 125 MCG PO TABS
125.0000 ug | ORAL_TABLET | Freq: Every day | ORAL | 3 refills | Status: DC
  Filled 2020-10-28: qty 30, 30d supply, fill #0
  Filled 2020-12-15: qty 30, 30d supply, fill #1
  Filled 2021-01-26: qty 30, 30d supply, fill #0
  Filled 2021-03-16: qty 30, 30d supply, fill #1

## 2020-10-28 NOTE — Telephone Encounter (Signed)
Requested Prescriptions  Pending Prescriptions Disp Refills  . levothyroxine (SYNTHROID) 125 MCG tablet 30 tablet 3    Sig: Take 1 tablet (125 mcg total) by mouth daily before breakfast.     Endocrinology:  Hypothyroid Agents Failed - 10/27/2020 12:03 PM      Failed - TSH needs to be rechecked within 3 months after an abnormal result. Refill until TSH is due.      Failed - TSH in normal range and within 360 days    TSH  Date Value Ref Range Status  05/24/2020 0.240 (L) 0.450 - 4.500 uIU/mL Final         Passed - Valid encounter within last 12 months    Recent Outpatient Visits          5 months ago Type 2 diabetes mellitus with hyperglycemia, with long-term current use of insulin ([place])   [place], [place], MD   1 year ago Type 2 diabetes mellitus with hyperglycemia, with long-term current use of insulin ([place])   [place], [place], MD   1 year ago Type 2 diabetes mellitus with hyperglycemia, with long-term current use of insulin ([place])   [place], [place], MD   1 year ago Type 2 diabetes mellitus with hyperglycemia, with long-term current use of insulin ([place])   [place], Charlane Ferretti, MD   1 year ago Type 2 diabetes mellitus with hyperglycemia, with long-term current use of insulin ([place])   [place], [place], MD      Future Appointments            In 3 weeks Charlott Rakes, MD [place]

## 2020-10-30 ENCOUNTER — Other Ambulatory Visit: Payer: Self-pay

## 2020-10-31 ENCOUNTER — Other Ambulatory Visit: Payer: Self-pay

## 2020-11-02 ENCOUNTER — Other Ambulatory Visit: Payer: Self-pay

## 2020-11-02 ENCOUNTER — Emergency Department (HOSPITAL_COMMUNITY)
Admission: EM | Admit: 2020-11-02 | Discharge: 2020-11-03 | Disposition: A | Discharge status: Home / Self Care | Payer: No Typology Code available for payment source | Attending: Emergency Medicine | Admitting: Emergency Medicine

## 2020-11-02 DIAGNOSIS — Z7982 Long term (current) use of aspirin: Secondary | ICD-10-CM | POA: Insufficient documentation

## 2020-11-02 DIAGNOSIS — E119 Type 2 diabetes mellitus without complications: Secondary | ICD-10-CM | POA: Insufficient documentation

## 2020-11-02 DIAGNOSIS — E039 Hypothyroidism, unspecified: Secondary | ICD-10-CM | POA: Insufficient documentation

## 2020-11-02 DIAGNOSIS — Z7984 Long term (current) use of oral hypoglycemic drugs: Secondary | ICD-10-CM | POA: Insufficient documentation

## 2020-11-02 DIAGNOSIS — R609 Edema, unspecified: Secondary | ICD-10-CM

## 2020-11-02 DIAGNOSIS — Z79899 Other long term (current) drug therapy: Secondary | ICD-10-CM | POA: Insufficient documentation

## 2020-11-02 DIAGNOSIS — R42 Dizziness and giddiness: Secondary | ICD-10-CM | POA: Insufficient documentation

## 2020-11-02 DIAGNOSIS — R600 Localized edema: Secondary | ICD-10-CM | POA: Insufficient documentation

## 2020-11-02 DIAGNOSIS — I10 Essential (primary) hypertension: Secondary | ICD-10-CM | POA: Insufficient documentation

## 2020-11-02 LAB — BASIC METABOLIC PANEL
Anion gap: 8 (ref 5–15)
BUN: 16 mg/dL (ref 8–23)
CO2: 25 mmol/L (ref 22–32)
Calcium: 9.2 mg/dL (ref 8.9–10.3)
Chloride: 104 mmol/L (ref 98–111)
Creatinine, Ser: 0.7 mg/dL (ref 0.44–1.00)
GFR, Estimated: >60 mL/min (ref >60)
Glucose, Bld: 120 mg/dL — ABNORMAL HIGH (ref 70–99)
Potassium: 4 mmol/L (ref 3.5–5.1)
Sodium: 137 mmol/L (ref 135–145)

## 2020-11-02 LAB — CBC WITH DIFFERENTIAL/PLATELET
Abs Immature Granulocytes: 0.02 10*3/uL (ref 0.00–0.07)
Basophils Absolute: 0 10*3/uL (ref 0.0–0.1)
Basophils Relative: 0 %
Eosinophils Absolute: 0.2 10*3/uL (ref 0.0–0.5)
Eosinophils Relative: 3 %
HCT: 40.7 % (ref 36.0–46.0)
Hemoglobin: 12.9 g/dL (ref 12.0–15.0)
Immature Granulocytes: 0 %
Lymphocytes Relative: 39 %
Lymphs Abs: 3 10*3/uL (ref 0.7–4.0)
MCH: 25.8 pg — ABNORMAL LOW (ref 26.0–34.0)
MCHC: 31.7 g/dL (ref 30.0–36.0)
MCV: 81.4 fL (ref 80.0–100.0)
Monocytes Absolute: 0.5 10*3/uL (ref 0.1–1.0)
Monocytes Relative: 7 %
Neutro Abs: 3.9 10*3/uL (ref 1.7–7.7)
Neutrophils Relative %: 51 %
Platelets: 312 10*3/uL (ref 150–400)
RBC: 5 MIL/uL (ref 3.87–5.11)
RDW: 14.7 % (ref 11.5–15.5)
WBC: 7.7 10*3/uL (ref 4.0–10.5)
nRBC: 0 % (ref 0.0–0.2)

## 2020-11-02 NOTE — ED Provider Notes (Signed)
Emergency Medicine Provider Triage Evaluation Note  Linda House , a 75 y.o. female  was evaluated in triage.  Pt complains of swelling.  Patient says that she started noticing swelling in the legs bilaterally and in her face today.  She is having any chest pain or shortness of breath.  Patient does have history of diabetes and hypothyroid, no changes in medicine.  Patient is on amlodipine for blood pressure, has been on it for years..  Review of Systems  Positive: Leg swelling Negative: Chest pain  Physical Exam  Ht 5\' 3"  (1.6 m)   Wt 72.6 kg   BMI 28.34 kg/m  Gen:   Awake, no distress   Resp:  Normal effort  MSK:   Moves extremities without difficulty  Other:  Some bilateral's lower extremity swelling without any pitting edema.  Medical Decision Making  Medically screening exam initiated at 9:03 PM.  Appropriate orders placed.  [name] was informed that the remainder of the evaluation will be completed by another provider, this initial triage assessment does not replace that evaluation, and the importance of remaining in the ED until their evaluation is complete.  Will check basic labs.  Patient is stable and appropriate to be seen in the back.  No signs of anaphylaxis.   Sherrill Raring, PA-C 11/02/20 [id number]    Lorelle Gibbs, DO 11/02/20 2353

## 2020-11-02 NOTE — ED Triage Notes (Signed)
Pt c/o leg swelling and dizziness that started this morning. Pt also reports facial swelling.

## 2020-11-03 NOTE — Discharge Instructions (Addendum)
You  may need to stop the Norvasc/amlodipine if the swelling continues

## 2020-11-03 NOTE — ED Provider Notes (Signed)
[facility] EMERGENCY DEPARTMENT Provider Note   CSN: [id number] Arrival date & time: 11/02/20  2042     History Chief Complaint  Patient presents with   Leg Swelling   Dizziness    Linda House is a 75 y.o. female.  The history is provided by the patient and a relative. A language interpreter was used ([place] [phone]).  Dizziness Quality:  Unable to specify Severity:  Mild Onset quality:  Gradual Progression:  Resolved Chronicity:  New Relieved by:  Nothing Worsened by:  Nothing Associated symptoms: no chest pain, no headaches, no shortness of breath, no syncope, no vision changes, no vomiting and no weakness   Risk factors: no heart disease   Patient presents with leg and facial swelling.  Patient reports she has noted lower extremity swelling for the past 3 days.  She also feels that her face is swollen.  No rash or allergic type reaction.  No difficulty swallowing No chest pain or shortness of breath. No new meds or medication adjustment.  Patient does take amlodipine. Patient also mention brief episodes of dizziness that is improved.  No focal weakness.  No other acute complaints     Past Medical History:  Diagnosis Date   Diabetes mellitus    Diabetes mellitus without complication ([place])    Hypertension     Patient Active Problem List   Diagnosis Date Noted   Dizziness 09/11/2013   Nausea 09/11/2013   Primary osteoarthritis of left knee 07/26/2013   Diabetes mellitus without complication ([place]) 77/82/4235   Vaginal discharge 04/26/2013   Diabetes ([place]) 11/19/2012   Essential hypertension, benign 11/19/2012   Dyslipidemia 11/19/2012   Osteoporosis, unspecified 11/19/2012   ACUTE GINGIVITIS PLAQUE INDUCED 10/16/2009   PLANTAR FASCIITIS, RIGHT 07/03/2009   ABDOMINAL BRUIT 03/28/2009   URINALYSIS, ABNORMAL 03/28/2009   MICROALBUMINURIA 03/21/2009   VARICOSE VEINS, LOWER EXTREMITIES 03/20/2009   ABSCESS, SKIN  03/20/2009   MUSCLE STRAIN, HAMSTRING MUSCLE 11/30/2007   OSTEOPENIA 06/22/2007   CYSTITIS 01/26/2007   HEMATURIA UNSPECIFIED 01/26/2007   Hypothyroidism 07/23/2006   DIABETES MELLITUS, TYPE II 07/23/2006   Diabetic radiculopathy ([place]) 07/23/2006   HYPERLIPIDEMIA 07/23/2006   HYPERTENSION 07/23/2006   CEREBROVASCULAR DISEASE 07/23/2006   MEDIAL EPICONDYLITIS, LEFT 07/23/2006   TRANSIENT ISCHEMIC ATTACK, HX OF 07/23/2006    No past surgical history on file.   OB History   No obstetric history on file.     Family History  Problem Relation Age of Onset   Diabetes Father    Hypertension Father     Social History   Tobacco Use   Smoking status: Never   Smokeless tobacco: Never  Vaping Use   Vaping Use: Never used  Substance Use Topics   Alcohol use: No   Drug use: No    Home Medications Prior to Admission medications   Medication Sig Start Date End Date Taking? Authorizing Provider  amLODipine (NORVASC) 10 MG tablet TAKE 1 TABLET (10 MG TOTAL) BY MOUTH DAILY. 05/24/20 05/24/21  Charlott Rakes, MD  aspirin EC 81 MG tablet Take 1 tablet (81 mg total) by mouth daily. 08/11/15   Charlott Rakes, MD  atorvastatin (LIPITOR) 80 MG tablet TAKE 1 TABLET (80 MG TOTAL) BY MOUTH DAILY. 05/24/20 05/24/21  Charlott Rakes, MD  Blood Glucose Monitoring Suppl (TRUE METRIX METER) w/Device KIT Use to check blood sugar daily. E11.65 02/11/19   Charlott Rakes, MD  cyclobenzaprine (FLEXERIL) 10 MG tablet TAKE 1 TABLET BY MOUTH AT BEDTIME. 09/23/18  Charlott Rakes, MD  dapagliflozin propanediol (FARXIGA) 5 MG TABS tablet TAKE 1 TABLET (5 MG TOTAL) BY MOUTH DAILY BEFORE BREAKFAST. 05/24/20 05/24/21  Charlott Rakes, MD  diclofenac Sodium (VOLTAREN) 1 % GEL Apply 4 g topically 4 (four) times daily. 05/04/19   Charlott Rakes, MD  gabapentin (NEURONTIN) 300 MG capsule Take 1 capsule (300 mg total) by mouth at bedtime. 05/24/20   Charlott Rakes, MD  glipiZIDE (GLUCOTROL XL) 10 MG 24 hr tablet TAKE 2  TABLETS (20 MG TOTAL) BY MOUTH DAILY. 05/24/20 05/24/21  Charlott Rakes, MD  glucose blood test strip Use as instructed to check blood sugar daily. E11.65 02/11/19   Charlott Rakes, MD  levothyroxine (SYNTHROID) 125 MCG tablet Take 1 tablet (125 mcg total) by mouth daily before breakfast. 10/28/20 10/28/21  Charlott Rakes, MD  lisinopril-hydrochlorothiazide (ZESTORETIC) 20-25 MG tablet TAKE 1 TABLET BY MOUTH DAILY. 05/24/20 05/24/21  Charlott Rakes, MD  meloxicam (MOBIC) 15 MG tablet Take 1 tablet (15 mg total) by mouth daily. 05/24/20 05/24/21  Charlott Rakes, MD  metFORMIN (GLUCOPHAGE) 1000 MG tablet TAKE 1 TABLET (1,000 MG TOTAL) BY MOUTH 2 (TWO) TIMES DAILY WITH A MEAL. 07/26/19 07/25/20  Charlott Rakes, MD  metFORMIN (GLUCOPHAGE) 1000 MG tablet Take 1 tablet (1,000 mg total) by mouth 2 (two) times daily with a meal. 05/24/20   [place], Charlane Ferretti, MD  olopatadine (PATANOL) 0.1 % ophthalmic solution Place 1 drop into both eyes 2 (two) times daily. 05/24/20   Charlott Rakes, MD  pioglitazone (ACTOS) 45 MG tablet TAKE 1 TABLET (45 MG TOTAL) BY MOUTH DAILY. 07/26/19 07/25/20  Charlott Rakes, MD  pioglitazone (ACTOS) 45 MG tablet Take 1 tablet (45 mg total) by mouth daily. 05/24/20   Charlott Rakes, MD  traZODone (DESYREL) 50 MG tablet Take 0.5-1 tablets (25-50 mg total) by mouth at bedtime as needed for sleep. 05/24/20   Charlott Rakes, MD  TRUEplus Lancets 28G MISC Use to check blood sugar daily. E11.65 02/11/19   Charlott Rakes, MD  Calcium 500-125 MG-UNIT TABS Take 1 tablet by mouth daily. Patient not taking: Reported on 12/31/2013 11/19/12 05/30/14  Tresa Garter, MD    Allergies    Patient has no known allergies.  Review of Systems   Review of Systems  Constitutional:  Negative for fever.  HENT:  Positive for facial swelling. Negative for trouble swallowing.   Eyes:  Negative for visual disturbance.  Respiratory:  Negative for shortness of breath.   Cardiovascular:  Positive for leg  swelling. Negative for chest pain and syncope.  Gastrointestinal:  Negative for vomiting.  Neurological:  Positive for dizziness. Negative for weakness and headaches.  All other systems reviewed and are negative.  Physical Exam Updated Vital Signs BP (!) 165/53 (BP Location: Right Arm)   Pulse 74   Temp 98.1 F (36.7 C) (Oral)   Resp 16   Ht 1.6 m (5' 3")   Wt 72.6 kg   SpO2 100%   BMI 28.34 kg/m   Physical Exam CONSTITUTIONAL: Elderly, no acute distress HEAD: Normocephalic/atraumatic EYES: EOMI/PERRL ENMT: Mucous membranes moist, no facial swelling, no angioedema, no stridor, no drooling No dysphonia NECK: supple no meningeal signs SPINE/BACK:entire spine nontender CV: S1/S2 noted, no murmurs/rubs/gallops noted LUNGS: Lungs are clear to auscultation bilaterally, no apparent distress ABDOMEN: soft, nontender, no rebound or guarding, bowel sounds noted throughout abdomen GU:no cva tenderness NEURO:  Awake/alert, face symmetric, no arm or leg drift is noted Equal 5/5 strength with shoulder abduction, elbow flex/extension, wrist flex/extension in upper  extremities and equal hand grips bilaterally Equal 5/5 strength with hip flexion,knee flex/extension, foot dorsi/plantar flexion Cranial nerves 3/4/5/6/07/22/08/11/12 tested and intact Gait normal without ataxia Sensation to light touch intact in all extremities EXTREMITIES: pulses normal/equal, full ROM Very mild edema noted to bilateral lower extremities.  It is nonpitting.  Distal pulses intact.  No calf tenderness, no erythema SKIN: warm, color normal PSYCH: no abnormalities of mood noted, alert and oriented to situation   ED Results / Procedures / Treatments   Labs (all labs ordered are listed, but only abnormal results are displayed) Labs Reviewed  BASIC METABOLIC PANEL - Abnormal; Notable for the following components:      Result Value   Glucose, Bld 120 (*)    All other components within normal limits  CBC WITH  DIFFERENTIAL/PLATELET - Abnormal; Notable for the following components:   MCH 25.8 (*)    All other components within normal limits    EKG None  Radiology No results found.  Procedures Procedures   Medications Ordered in ED Medications - No data to display  ED Course  I have reviewed the triage vital signs and the nursing notes.  Pertinent labs results that were available during my care of the patient were reviewed by me and considered in my medical decision making (see chart for details).    MDM Rules/Calculators/A&P                           Patient is very well-appearing.  Her biggest concern is her lower extremity edema.  There is no evidence of any facial swelling or angioedema No evidence of any DVT and no previous history In terms of her dizziness, it has resolved there is no focal weakness and patient ambulates without difficulty.  At this point she is safe for discharge.  Peripheral edema could be due to medications. She has follow-up with PCP next month.  We discussed strict  return precautions Final Clinical Impression(s) / ED Diagnoses Final diagnoses:  Peripheral edema    Rx / DC Orders ED Discharge Orders     None        Ripley Fraise, MD 11/03/20 [phone]

## 2020-11-03 NOTE — ED Notes (Signed)
Patient verbalizes understanding of discharge instructions. Opportunity for questioning and answers were provided. Armband removed by staff, pt discharged from ED ambulatory. Interpreter used to go over discharge papers.

## 2020-11-21 ENCOUNTER — Other Ambulatory Visit: Payer: Self-pay

## 2020-11-21 ENCOUNTER — Ambulatory Visit: Payer: No Typology Code available for payment source | Attending: Family Medicine | Admitting: Family Medicine

## 2020-11-21 ENCOUNTER — Encounter: Payer: Self-pay | Admitting: Family Medicine

## 2020-11-21 VITALS — BP 145/61 | HR 72 | Ht 60.0 in | Wt 155.8 lb

## 2020-11-21 DIAGNOSIS — I10 Essential (primary) hypertension: Secondary | ICD-10-CM

## 2020-11-21 DIAGNOSIS — Z794 Long term (current) use of insulin: Secondary | ICD-10-CM

## 2020-11-21 DIAGNOSIS — Z0000 Encounter for general adult medical examination without abnormal findings: Secondary | ICD-10-CM

## 2020-11-21 DIAGNOSIS — Z0001 Encounter for general adult medical examination with abnormal findings: Secondary | ICD-10-CM

## 2020-11-21 DIAGNOSIS — E039 Hypothyroidism, unspecified: Secondary | ICD-10-CM

## 2020-11-21 DIAGNOSIS — D229 Melanocytic nevi, unspecified: Secondary | ICD-10-CM

## 2020-11-21 DIAGNOSIS — Z1211 Encounter for screening for malignant neoplasm of colon: Secondary | ICD-10-CM

## 2020-11-21 DIAGNOSIS — R6889 Other general symptoms and signs: Secondary | ICD-10-CM

## 2020-11-21 DIAGNOSIS — E1165 Type 2 diabetes mellitus with hyperglycemia: Secondary | ICD-10-CM

## 2020-11-21 LAB — POCT GLYCOSYLATED HEMOGLOBIN (HGB A1C): HbA1c, POC (controlled diabetic range): 6.9 % (ref 0.0–7.0)

## 2020-11-21 LAB — GLUCOSE, POCT (MANUAL RESULT ENTRY): POC Glucose: 98 mg/dl (ref 70–99)

## 2020-11-21 MED ORDER — SPIRONOLACTONE 25 MG PO TABS
25.0000 mg | ORAL_TABLET | Freq: Every day | ORAL | 3 refills | Status: DC
  Filled 2020-11-21: qty 30, 30d supply, fill #0

## 2020-11-21 MED ORDER — OLOPATADINE HCL 0.1 % OP SOLN
1.0000 [drp] | Freq: Two times a day (BID) | OPHTHALMIC | 1 refills | Status: AC
  Filled 2020-11-21: qty 5, 50d supply, fill #0

## 2020-11-21 NOTE — Patient Instructions (Signed)

## 2020-11-21 NOTE — Progress Notes (Signed)
Subjective:  Patient ID: Linda House, female    DOB: [birth year]  Age: 75 y.o. MRN: [id number]  CC: Annual Exam   HPI Linda House is a 75 y.o. year old female with a history of type 2 diabetes mellitus (A1c 6.9), hypothyroidism, hyperlipidemia, hypertension who presents today for an annual physical exam. She is due for colorectal cancer screening.  Her last mammogram from 07/2019 was normal.  Interval History: She would like her L eye checked as it tears up a lot. Sometimes itches but has no visual disturbances. A1c is 6.9 down from 7.4 previously and she endorses compliance with her diabetic regimen.  She has no hypoglycemic episodes, numbness in extremities. Blood pressure is slightly elevated and she endorses compliance with her antihypertensive.  Her TSH was suppressed in 05/2020.  She has been compliant with levothyroxine.  She noticed a mole in her lower back which was previously not present.  She is unsure of the duration and would like this checked out. Past Medical History:  Diagnosis Date   Diabetes mellitus    Diabetes mellitus without complication ([place])    Hypertension     No past surgical history on file.  Family History  Problem Relation Age of Onset   Diabetes Father    Hypertension Father     No Known Allergies  Outpatient Medications Prior to Visit  Medication Sig Dispense Refill   amLODipine (NORVASC) 10 MG tablet TAKE 1 TABLET (10 MG TOTAL) BY MOUTH DAILY. 30 tablet 6   aspirin EC 81 MG tablet Take 1 tablet (81 mg total) by mouth daily. 30 tablet 3   atorvastatin (LIPITOR) 80 MG tablet TAKE 1 TABLET (80 MG TOTAL) BY MOUTH DAILY. 30 tablet 6   Blood Glucose Monitoring Suppl (TRUE METRIX METER) w/Device KIT Use to check blood sugar daily. E11.65 1 kit 0   cyclobenzaprine (FLEXERIL) 10 MG tablet TAKE 1 TABLET BY MOUTH AT BEDTIME. 30 tablet 0   dapagliflozin propanediol (FARXIGA) 5 MG TABS tablet TAKE 1 TABLET (5 MG TOTAL) BY MOUTH  DAILY BEFORE BREAKFAST. 90 tablet 1   diclofenac Sodium (VOLTAREN) 1 % GEL Apply 4 g topically 4 (four) times daily. 150 g 1   gabapentin (NEURONTIN) 300 MG capsule Take 1 capsule (300 mg total) by mouth at bedtime. 30 capsule 6   glipiZIDE (GLUCOTROL XL) 10 MG 24 hr tablet TAKE 2 TABLETS (20 MG TOTAL) BY MOUTH DAILY. 60 tablet 6   glucose blood test strip Use as instructed to check blood sugar daily. E11.65 100 each 11   levothyroxine (SYNTHROID) 125 MCG tablet Take 1 tablet (125 mcg total) by mouth daily before breakfast. 30 tablet 3   lisinopril-hydrochlorothiazide (ZESTORETIC) 20-25 MG tablet TAKE 1 TABLET BY MOUTH DAILY. 30 tablet 6   meloxicam (MOBIC) 15 MG tablet Take 1 tablet (15 mg total) by mouth daily. 90 tablet 1   metFORMIN (GLUCOPHAGE) 1000 MG tablet Take 1 tablet (1,000 mg total) by mouth 2 (two) times daily with a meal. 60 tablet 6   pioglitazone (ACTOS) 45 MG tablet Take 1 tablet (45 mg total) by mouth daily. 30 tablet 6   traZODone (DESYREL) 50 MG tablet Take 0.5-1 tablets (25-50 mg total) by mouth at bedtime as needed for sleep. 30 tablet 3   TRUEplus Lancets 28G MISC Use to check blood sugar daily. E11.65 100 each 11   olopatadine (PATANOL) 0.1 % ophthalmic solution Place 1 drop into both eyes 2 (two) times daily. 5 mL 1  metFORMIN (GLUCOPHAGE) 1000 MG tablet TAKE 1 TABLET (1,000 MG TOTAL) BY MOUTH 2 (TWO) TIMES DAILY WITH A MEAL. 60 tablet 6   pioglitazone (ACTOS) 45 MG tablet TAKE 1 TABLET (45 MG TOTAL) BY MOUTH DAILY. 30 tablet 6   No facility-administered medications prior to visit.     ROS Review of Systems  Constitutional:  Negative for activity change, appetite change and fatigue.  HENT:  Negative for congestion, sinus pressure and sore throat.   Eyes:  Positive for itching. Negative for visual disturbance.  Respiratory:  Negative for cough, chest tightness, shortness of breath and wheezing.   Cardiovascular:  Negative for chest pain and palpitations.   Gastrointestinal:  Negative for abdominal distention, abdominal pain and constipation.  Endocrine: Negative for polydipsia.  Genitourinary:  Negative for dysuria and frequency.  Musculoskeletal:  Negative for arthralgias and back pain.  Skin:  Positive for rash.  Neurological:  Negative for tremors, light-headedness and numbness.  Hematological:  Does not bruise/bleed easily.  Psychiatric/Behavioral:  Negative for agitation and behavioral problems.    Objective:  BP (!) 145/61   Pulse 72   Ht 5' (1.524 m)   Wt 155 lb 12.8 oz (70.7 kg)   SpO2 97%   BMI 30.43 kg/m   BP/Weight 11/21/2020 11/03/2020 14/78/2956  Systolic BP 213 086 -  Diastolic BP 61 53 -  Wt. (Lbs) 155.8 - 160  BMI 30.43 - 28.34      Physical Exam Constitutional:      General: She is not in acute distress.    Appearance: She is well-developed. She is not diaphoretic.  HENT:     Head: Normocephalic.     Right Ear: External ear normal. There is impacted cerumen.     Left Ear: External ear normal.     Nose: Nose normal.     Mouth/Throat:     Mouth: Mucous membranes are moist.  Eyes:     Extraocular Movements: Extraocular movements intact.     Conjunctiva/sclera: Conjunctivae normal.     Pupils: Pupils are equal, round, and reactive to light.  Neck:     Vascular: No JVD.  Cardiovascular:     Rate and Rhythm: Normal rate and regular rhythm.     Pulses: Normal pulses.     Heart sounds: Normal heart sounds. No murmur heard.   No gallop.  Pulmonary:     Effort: Pulmonary effort is normal. No respiratory distress.     Breath sounds: Normal breath sounds. No wheezing or rales.  Chest:     Chest wall: No tenderness.  Abdominal:     General: Bowel sounds are normal. There is no distension.     Palpations: Abdomen is soft. There is no mass.     Tenderness: There is no abdominal tenderness.  Musculoskeletal:        General: No tenderness. Normal range of motion.     Cervical back: Normal range of motion.   Skin:    General: Skin is warm and dry.     Comments: Hyperpigmented nodule in the mid lumbar region  Neurological:     Mental Status: She is alert and oriented to person, place, and time.     Deep Tendon Reflexes: Reflexes are normal and symmetric.  Psychiatric:        Mood and Affect: Mood normal.    CMP Latest Ref Rng & Units 11/02/2020 05/24/2020 10/26/2019  Glucose 70 - 99 mg/dL 120(H) 85 104(H)  BUN 8 - 23 mg/dL 16 11  11  Creatinine 0.44 - 1.00 mg/dL 0.70 0.70 0.78  Sodium 135 - 145 mmol/L 137 145(H) 139  Potassium 3.5 - 5.1 mmol/L 4.0 4.9 4.2  Chloride 98 - 111 mmol/L 104 104 97  CO2 22 - 32 mmol/L '25 24 25  ' Calcium 8.9 - 10.3 mg/dL 9.2 9.4 9.4  Total Protein 6.0 - 8.5 g/dL - 7.0 7.3  Total Bilirubin 0.0 - 1.2 mg/dL - 0.3 0.4  Alkaline Phos 44 - 121 IU/L - 172(H) 173(H)  AST 0 - 40 IU/L - 17 16  ALT 0 - 32 IU/L - 15 18    Lipid Panel     Component Value Date/Time   CHOL 186 05/24/2020 0918   TRIG 137 05/24/2020 0918   HDL 55 05/24/2020 0918   CHOLHDL 3.4 05/24/2020 0918   CHOLHDL 3.4 02/19/2016 1021   VLDL 25 08/29/2015 0849   LDLCALC 107 (H) 05/24/2020 0918    CBC    Component Value Date/Time   WBC 7.7 11/02/2020 2109   RBC 5.00 11/02/2020 2109   HGB 12.9 11/02/2020 2109   HGB 12.2 08/27/2016 0919   HCT 40.7 11/02/2020 2109   HCT 38.2 08/27/2016 0919   PLT 312 11/02/2020 2109   PLT 355 08/27/2016 0919   MCV 81.4 11/02/2020 2109   MCV 79 08/27/2016 0919   MCH 25.8 (L) 11/02/2020 2109   MCHC 31.7 11/02/2020 2109   RDW 14.7 11/02/2020 2109   RDW 15.2 08/27/2016 0919   LYMPHSABS 3.0 11/02/2020 2109   LYMPHSABS 2.5 08/27/2016 0919   MONOABS 0.5 11/02/2020 2109   EOSABS 0.2 11/02/2020 2109   EOSABS 0.2 08/27/2016 0919   BASOSABS 0.0 11/02/2020 2109   BASOSABS 0.0 08/27/2016 0919    Lab Results  Component Value Date   HGBA1C 6.9 11/21/2020    Lab Results  Component Value Date   TSH 0.240 (L) 05/24/2020    Assessment & Plan:  1. Annual  physical exam Counseled on 150 minutes of exercise per week, healthy eating (including decreased daily intake of saturated fats, cholesterol, added sugars, sodium),routine healthcare maintenance.   2. Type 2 diabetes mellitus with hyperglycemia, with long-term current use of insulin (HCC) Controlled with A1c of 6.9 Continue current regimen Counseled on Diabetic diet, my plate method, 287 minutes of moderate intensity exercise/week Blood sugar logs with fasting goals of 80-120 mg/dl, random of less than 180 and in the event of sugars less than 60 mg/dl or greater than 400 mg/dl encouraged to notify the clinic. Advised on the need for annual eye exams, annual foot exams, Pneumonia vaccine. - POCT glucose (manual entry) - POCT glycosylated hemoglobin (Hb A1C)  3. Screening for colon cancer - Fecal occult blood, imunochemical([facility])  4. Itchy eyes - olopatadine (PATANOL) 0.1 % ophthalmic solution; Place 1 drop into both eyes 2 (two) times daily.  Dispense: 5 mL; Refill: 1  5. Skin mole She will need a biopsy - Ambulatory referral to Dermatology  6. Hypothyroidism, unspecified type Last TSH was suppressed Will check thyroid panel and adjust regimen accordingly - T4, free - TSH  7. Essential hypertension, benign Uncontrolled Spironolactone added to regimen Will check potassium in 2 weeks Counseled on blood pressure goal of less than 130/80, low-sodium, DASH diet, medication compliance, 150 minutes of moderate intensity exercise per week. Discussed medication compliance, adverse effects. - spironolactone (ALDACTONE) 25 MG tablet; Take 1 tablet (25 mg total) by mouth daily.  Dispense: 30 tablet; Refill: 3 - Basic Metabolic Panel; Future   Meds  ordered this encounter  Medications   olopatadine (PATANOL) 0.1 % ophthalmic solution    Sig: Place 1 drop into both eyes 2 (two) times daily.    Dispense:  5 mL    Refill:  1   spironolactone (ALDACTONE) 25 MG tablet    Sig:  Take 1 tablet (25 mg total) by mouth daily.    Dispense:  30 tablet    Refill:  3    Follow-up: Return in about 6 months (around 05/21/2021) for Medical conditions.       Charlott Rakes, MD, FAAFP. [facility] [place], [place]   11/21/2020, 1:15 PM

## 2020-11-22 LAB — TSH: TSH: 3.13 u[IU]/mL (ref 0.450–4.500)

## 2020-11-22 LAB — T4, FREE: Free T4: 1.41 ng/dL (ref 0.82–1.77)

## 2020-11-23 ENCOUNTER — Other Ambulatory Visit: Payer: Self-pay

## 2020-11-27 ENCOUNTER — Other Ambulatory Visit: Payer: Self-pay

## 2020-11-27 ENCOUNTER — Ambulatory Visit: Payer: Self-pay | Attending: Family Medicine

## 2020-11-27 DIAGNOSIS — I10 Essential (primary) hypertension: Secondary | ICD-10-CM

## 2020-11-28 LAB — BASIC METABOLIC PANEL
BUN/Creatinine Ratio: 14 (ref 12–28)
BUN: 10 mg/dL (ref 8–27)
CO2: 24 mmol/L (ref 20–29)
Calcium: 9.2 mg/dL (ref 8.7–10.3)
Chloride: 102 mmol/L (ref 96–106)
Creatinine, Ser: 0.74 mg/dL (ref 0.57–1.00)
Glucose: 87 mg/dL (ref 70–99)
Potassium: 4.5 mmol/L (ref 3.5–5.2)
Sodium: 143 mmol/L (ref 134–144)
eGFR: 85 mL/min/{1.73_m2} (ref >59)

## 2020-11-30 ENCOUNTER — Telehealth: Payer: Self-pay

## 2020-11-30 NOTE — Telephone Encounter (Signed)
-----   Message from Charlott Rakes, MD sent at 11/28/2020  8:26 AM EST ----- Please inform the patient that labs are normal. Thank you.

## 2020-11-30 NOTE — Telephone Encounter (Signed)
Pt was called and a VM was left informing patient of lab results. 

## 2020-12-15 ENCOUNTER — Other Ambulatory Visit: Payer: Self-pay

## 2020-12-19 ENCOUNTER — Ambulatory Visit: Payer: No Typology Code available for payment source | Attending: Family Medicine

## 2020-12-19 ENCOUNTER — Other Ambulatory Visit: Payer: Self-pay

## 2021-01-16 ENCOUNTER — Other Ambulatory Visit: Payer: Self-pay

## 2021-01-26 ENCOUNTER — Other Ambulatory Visit: Payer: Self-pay

## 2021-02-05 ENCOUNTER — Other Ambulatory Visit (HOSPITAL_COMMUNITY): Payer: Self-pay

## 2021-03-16 ENCOUNTER — Other Ambulatory Visit: Payer: Self-pay | Admitting: Family Medicine

## 2021-03-16 ENCOUNTER — Other Ambulatory Visit: Payer: Self-pay

## 2021-03-16 DIAGNOSIS — E1165 Type 2 diabetes mellitus with hyperglycemia: Secondary | ICD-10-CM

## 2021-03-16 DIAGNOSIS — Z794 Long term (current) use of insulin: Secondary | ICD-10-CM

## 2021-03-16 MED ORDER — DAPAGLIFLOZIN PROPANEDIOL 5 MG PO TABS
ORAL_TABLET | Freq: Every day | ORAL | 0 refills | Status: DC
  Filled 2021-03-16: qty 30, 30d supply, fill #0

## 2021-03-16 NOTE — Telephone Encounter (Signed)
Requested Prescriptions  ?Pending Prescriptions Disp Refills  ?? dapagliflozin propanediol (FARXIGA) 5 MG TABS tablet 90 tablet 1  ?  Sig: TAKE 1 TABLET (5 MG TOTAL) BY MOUTH DAILY BEFORE BREAKFAST.  ?  ? Endocrinology:  Diabetes - SGLT2 Inhibitors Passed - 03/16/2021 10:23 AM  ?  ?  Passed - Cr in normal range and within 360 days  ?  Creat  ?Date Value Ref Range Status  ?02/19/2016 0.74 0.50 - 0.99 mg/dL Final  ?  Comment:  ?    ?For patients > or = 75 years of age: The upper reference limit for ?Creatinine is approximately 13% higher for people identified as ?African-American. ?  ?  ? ?Creatinine, Ser  ?Date Value Ref Range Status  ?11/27/2020 0.74 0.57 - 1.00 mg/dL Final  ? ?Creatinine, Urine  ?Date Value Ref Range Status  ?02/19/2016 296 20 - 320 mg/dL Final  ?   ?  ?  Passed - HBA1C is between 0 and 7.9 and within 180 days  ?  HbA1c, POC (controlled diabetic range)  ?Date Value Ref Range Status  ?11/21/2020 6.9 0.0 - 7.0 % Final  ?   ?  ?  Passed - eGFR in normal range and within 360 days  ?  GFR, Est African American  ?Date Value Ref Range Status  ?02/19/2016 >89 >=60 mL/min Final  ? ?GFR calc Af Amer  ?Date Value Ref Range Status  ?10/26/2019 87 >59 mL/min/1.73 Final  ?  Comment:  ?  **[facility] currently reports eGFR in compliance with the current** ?  recommendations of the [employer]. [facility] will ?  update reporting as new guidelines are published from the NKF-ASN ?  Task force. ?  ? ?GFR, Est Non African American  ?Date Value Ref Range Status  ?02/19/2016 83 >=60 mL/min Final  ? ?GFR, Estimated  ?Date Value Ref Range Status  ?11/02/2020 >60 >60 mL/min Final  ?  Comment:  ?  (NOTE) ?Calculated using the CKD-EPI Creatinine Equation (2021) ?  ? ?eGFR  ?Date Value Ref Range Status  ?11/27/2020 85 >59 mL/min/1.73 Final  ?   ?  ?  Passed - Valid encounter within last 6 months  ?  Recent Outpatient Visits   ?      ? 3 months ago Annual physical exam  ? [place], Charlane Ferretti, MD  ? 9 months ago Type 2 diabetes mellitus with hyperglycemia, with long-term current use of insulin ([place])  ? [place], Charlane Ferretti, MD  ? 1 year ago Type 2 diabetes mellitus with hyperglycemia, with long-term current use of insulin ([place])  ? [place], Charlane Ferretti, MD  ? 1 year ago Type 2 diabetes mellitus with hyperglycemia, with long-term current use of insulin ([place])  ? [place], Charlane Ferretti, MD  ? 1 year ago Type 2 diabetes mellitus with hyperglycemia, with long-term current use of insulin ([place])  ? [place] Charlott Rakes, MD  ?  ?  ?Future Appointments   ?        ? In 2 months Charlott Rakes, MD [place]  ?  ? ?  ?  ?  ? ?

## 2021-03-20 ENCOUNTER — Other Ambulatory Visit: Payer: Self-pay

## 2021-03-26 ENCOUNTER — Other Ambulatory Visit: Payer: Self-pay

## 2021-03-26 ENCOUNTER — Other Ambulatory Visit: Payer: Self-pay | Admitting: Pharmacist

## 2021-03-26 MED ORDER — EMPAGLIFLOZIN 10 MG PO TABS
10.0000 mg | ORAL_TABLET | Freq: Every day | ORAL | 2 refills | Status: DC
  Filled 2021-03-26: qty 30, 30d supply, fill #0

## 2021-04-17 ENCOUNTER — Other Ambulatory Visit: Payer: Self-pay

## 2021-04-26 ENCOUNTER — Other Ambulatory Visit: Payer: Self-pay

## 2021-04-26 ENCOUNTER — Other Ambulatory Visit: Payer: Self-pay | Admitting: Family Medicine

## 2021-04-26 DIAGNOSIS — I10 Essential (primary) hypertension: Secondary | ICD-10-CM

## 2021-04-26 DIAGNOSIS — E1149 Type 2 diabetes mellitus with other diabetic neurological complication: Secondary | ICD-10-CM

## 2021-04-26 DIAGNOSIS — E039 Hypothyroidism, unspecified: Secondary | ICD-10-CM

## 2021-04-26 DIAGNOSIS — E1169 Type 2 diabetes mellitus with other specified complication: Secondary | ICD-10-CM

## 2021-04-26 MED ORDER — PIOGLITAZONE HCL 45 MG PO TABS
45.0000 mg | ORAL_TABLET | Freq: Every day | ORAL | 0 refills | Status: DC
  Filled 2021-04-26: qty 30, 30d supply, fill #0
  Filled 2021-05-22: qty 30, 30d supply, fill #1
  Filled 2021-08-02: qty 30, 30d supply, fill #2

## 2021-04-26 MED ORDER — LISINOPRIL-HYDROCHLOROTHIAZIDE 20-25 MG PO TABS
1.0000 | ORAL_TABLET | Freq: Every day | ORAL | 0 refills | Status: DC
  Filled 2021-04-26: qty 30, 30d supply, fill #0

## 2021-04-26 MED ORDER — ATORVASTATIN CALCIUM 80 MG PO TABS
ORAL_TABLET | Freq: Every day | ORAL | 0 refills | Status: DC
  Filled 2021-04-26: qty 30, 30d supply, fill #0

## 2021-04-26 MED ORDER — METFORMIN HCL 1000 MG PO TABS
1000.0000 mg | ORAL_TABLET | Freq: Two times a day (BID) | ORAL | 0 refills | Status: DC
  Filled 2021-04-26: qty 180, 90d supply, fill #0

## 2021-04-26 MED ORDER — AMLODIPINE BESYLATE 10 MG PO TABS
ORAL_TABLET | Freq: Every day | ORAL | 0 refills | Status: DC
  Filled 2021-04-26: qty 90, 90d supply, fill #0

## 2021-04-26 MED ORDER — GLIPIZIDE ER 10 MG PO TB24
ORAL_TABLET | Freq: Every day | ORAL | 6 refills | Status: DC
  Filled 2021-04-26: qty 60, 30d supply, fill #0
  Filled 2021-05-22: qty 60, 30d supply, fill #1
  Filled 2021-11-01: qty 180, 90d supply, fill #2

## 2021-04-26 MED ORDER — LEVOTHYROXINE SODIUM 125 MCG PO TABS
125.0000 ug | ORAL_TABLET | Freq: Every day | ORAL | 0 refills | Status: DC
  Filled 2021-04-26: qty 90, 90d supply, fill #0

## 2021-04-26 NOTE — Telephone Encounter (Signed)
Requested Prescriptions  ?Pending Prescriptions Disp Refills  ?? amLODipine (NORVASC) 10 MG tablet 30 tablet 6  ?  Sig: TAKE 1 TABLET (10 MG TOTAL) BY MOUTH DAILY.  ?  ? Cardiovascular: Calcium Channel Blockers 2 Failed - 04/26/2021  1:10 PM  ?  ?  Failed - Last BP in normal range  ?  BP Readings from Last 1 Encounters:  ?11/21/20 (!) 145/61  ?   ?  ?  Passed - Last Heart Rate in normal range  ?  Pulse Readings from Last 1 Encounters:  ?11/21/20 72  ?   ?  ?  Passed - Valid encounter within last 6 months  ?  Recent Outpatient Visits   ?      ? 5 months ago Annual physical exam  ? [place], Charlane Ferretti, MD  ? 11 months ago Type 2 diabetes mellitus with hyperglycemia, with long-term current use of insulin ([place])  ? [place], Charlane Ferretti, MD  ? 1 year ago Type 2 diabetes mellitus with hyperglycemia, with long-term current use of insulin ([place])  ? [place], Charlane Ferretti, MD  ? 1 year ago Type 2 diabetes mellitus with hyperglycemia, with long-term current use of insulin ([place])  ? [place], Charlane Ferretti, MD  ? 1 year ago Type 2 diabetes mellitus with hyperglycemia, with long-term current use of insulin ([place])  ? [place] Charlott Rakes, MD  ?  ?  ?Future Appointments   ?        ? In 3 weeks Charlott Rakes, MD [place]  ?  ? ?  ?  ?  ?? atorvastatin (LIPITOR) 80 MG tablet 30 tablet 6  ?  Sig: TAKE 1 TABLET (80 MG TOTAL) BY MOUTH DAILY.  ?  ? Cardiovascular:  Antilipid - Statins Failed - 04/26/2021  1:10 PM  ?  ?  Failed - Lipid Panel in normal range within the last 12 months  ?  Cholesterol, Total  ?Date Value Ref Range Status  ?05/24/2020 186 100 - 199 mg/dL Final  ? ?LDL Chol Calc (NIH)  ?Date Value Ref Range Status  ?05/24/2020 107 (H) 0 - 99 mg/dL Final  ? ?HDL  ?Date Value Ref Range Status  ?05/24/2020 55 >39 mg/dL  Final  ? ?Triglycerides  ?Date Value Ref Range Status  ?05/24/2020 137 0 - 149 mg/dL Final  ? ?  ?  ?  Passed - Patient is not pregnant  ?  ?  Passed - Valid encounter within last 12 months  ?  Recent Outpatient Visits   ?      ? 5 months ago Annual physical exam  ? [place], Charlane Ferretti, MD  ? 11 months ago Type 2 diabetes mellitus with hyperglycemia, with long-term current use of insulin ([place])  ? [place], Charlane Ferretti, MD  ? 1 year ago Type 2 diabetes mellitus with hyperglycemia, with long-term current use of insulin ([place])  ? [place], Charlane Ferretti, MD  ? 1 year ago Type 2 diabetes mellitus with hyperglycemia, with long-term current use of insulin ([place])  ? [place], Charlane Ferretti, MD  ? 1 year ago Type 2 diabetes mellitus with hyperglycemia, with long-term current use of insulin ([place])  ? [place]  [facility] Charlott Rakes, MD  ?  ?  ?Future Appointments   ?        ? In 3 weeks Charlott Rakes, MD [place]  ?  ? ?  ?  ?  ?? glipiZIDE (GLIPIZIDE XL) 10 MG 24 hr tablet 60 tablet 6  ?  Sig: TAKE 2 TABLETS (20 MG TOTAL) BY MOUTH DAILY.  ?  ? Endocrinology:  Diabetes - Sulfonylureas Passed - 04/26/2021  1:10 PM  ?  ?  Passed - HBA1C is between 0 and 7.9 and within 180 days  ?  HbA1c, POC (controlled diabetic range)  ?Date Value Ref Range Status  ?11/21/2020 6.9 0.0 - 7.0 % Final  ?   ?  ?  Passed - Cr in normal range and within 360 days  ?  Creat  ?Date Value Ref Range Status  ?02/19/2016 0.74 0.50 - 0.99 mg/dL Final  ?  Comment:  ?    ?For patients > or = 75 years of age: The upper reference limit for ?Creatinine is approximately 13% higher for people identified as ?African-American. ?  ?  ? ?Creatinine, Ser  ?Date Value Ref Range Status  ?11/27/2020 0.74 0.57 - 1.00 mg/dL Final  ? ?Creatinine, Urine  ?Date Value Ref Range  Status  ?02/19/2016 296 20 - 320 mg/dL Final  ?   ?  ?  Passed - Valid encounter within last 6 months  ?  Recent Outpatient Visits   ?      ? 5 months ago Annual physical exam  ? [place], Charlane Ferretti, MD  ? 11 months ago Type 2 diabetes mellitus with hyperglycemia, with long-term current use of insulin ([place])  ? [place], Charlane Ferretti, MD  ? 1 year ago Type 2 diabetes mellitus with hyperglycemia, with long-term current use of insulin ([place])  ? [place], Charlane Ferretti, MD  ? 1 year ago Type 2 diabetes mellitus with hyperglycemia, with long-term current use of insulin ([place])  ? [place], Charlane Ferretti, MD  ? 1 year ago Type 2 diabetes mellitus with hyperglycemia, with long-term current use of insulin ([place])  ? [place] Charlott Rakes, MD  ?  ?  ?Future Appointments   ?        ? In 3 weeks Charlott Rakes, MD [place]  ?  ? ?  ?  ?  ?? lisinopril-hydrochlorothiazide (ZESTORETIC) 20-25 MG tablet 30 tablet 6  ?  Sig: TAKE 1 TABLET BY MOUTH DAILY.  ?  ? Cardiovascular:  ACEI + Diuretic Combos Failed - 04/26/2021  1:10 PM  ?  ?  Failed - Last BP in normal range  ?  BP Readings from Last 1 Encounters:  ?11/21/20 (!) 145/61  ?   ?  ?  Passed - Na in normal range and within 180 days  ?  Sodium  ?Date Value Ref Range Status  ?11/27/2020 143 134 - 144 mmol/L Final  ?   ?  ?  Passed - K in normal range and within 180 days  ?  Potassium  ?Date Value Ref Range Status  ?11/27/2020 4.5 3.5 - 5.2 mmol/L Final  ?   ?  ?  Passed - Cr in normal range and within 180 days  ?  Creat  ?Date Value Ref Range Status  ?02/19/2016 0.74 0.50 -  0.99 mg/dL Final  ?  Comment:  ?    ?For patients > or = 75 years of age: The upper reference limit for ?Creatinine is approximately 13% higher for people identified as ?African-American. ?  ?  ? ?Creatinine,  Ser  ?Date Value Ref Range Status  ?11/27/2020 0.74 0.57 - 1.00 mg/dL Final  ? ?Creatinine, Urine  ?Date Value Ref Range Status  ?02/19/2016 296 20 - 320 mg/dL Final  ?   ?  ?  Passed - eGFR is 30 or above and within 180 days  ?  GFR, Est African American  ?Date Value Ref Range Status  ?02/19/2016 >89 >=60 mL/min Final  ? ?GFR calc Af Amer  ?Date Value Ref Range Status  ?10/26/2019 87 >59 mL/min/1.73 Final  ?  Comment:  ?  **[facility] currently reports eGFR in compliance with the current** ?  recommendations of the [employer]. [facility] will ?  update reporting as new guidelines are published from the NKF-ASN ?  Task force. ?  ? ?GFR, Est Non African American  ?Date Value Ref Range Status  ?02/19/2016 83 >=60 mL/min Final  ? ?GFR, Estimated  ?Date Value Ref Range Status  ?11/02/2020 >60 >60 mL/min Final  ?  Comment:  ?  (NOTE) ?Calculated using the CKD-EPI Creatinine Equation (2021) ?  ? ?eGFR  ?Date Value Ref Range Status  ?11/27/2020 85 >59 mL/min/1.73 Final  ?   ?  ?  Passed - Patient is not pregnant  ?  ?  Passed - Valid encounter within last 6 months  ?  Recent Outpatient Visits   ?      ? 5 months ago Annual physical exam  ? [place], Charlane Ferretti, MD  ? 11 months ago Type 2 diabetes mellitus with hyperglycemia, with long-term current use of insulin ([place])  ? [place], Charlane Ferretti, MD  ? 1 year ago Type 2 diabetes mellitus with hyperglycemia, with long-term current use of insulin ([place])  ? [place], Charlane Ferretti, MD  ? 1 year ago Type 2 diabetes mellitus with hyperglycemia, with long-term current use of insulin ([place])  ? [place], Charlane Ferretti, MD  ? 1 year ago Type 2 diabetes mellitus with hyperglycemia, with long-term current use of insulin ([place])  ? [place] Charlott Rakes, MD  ?  ?  ?Future Appointments   ?        ? In 3 weeks  Charlott Rakes, MD [place]  ?  ? ?  ?  ?  ?? metFORMIN (GLUCOPHAGE) 1000 MG tablet 60 tablet 6  ?  Sig: Take 1 tablet (1,000 mg total) by mouth 2 (two) times daily with

## 2021-04-27 ENCOUNTER — Other Ambulatory Visit: Payer: Self-pay

## 2021-05-21 ENCOUNTER — Ambulatory Visit: Payer: No Typology Code available for payment source | Admitting: Family Medicine

## 2021-05-22 ENCOUNTER — Encounter: Payer: Self-pay | Admitting: Family Medicine

## 2021-05-22 ENCOUNTER — Ambulatory Visit: Payer: Self-pay | Attending: Family Medicine | Admitting: Family Medicine

## 2021-05-22 ENCOUNTER — Other Ambulatory Visit: Payer: Self-pay

## 2021-05-22 VITALS — BP 119/72 | HR 87 | Temp 97.8°F | Ht 60.0 in | Wt 152.0 lb

## 2021-05-22 DIAGNOSIS — I10 Essential (primary) hypertension: Secondary | ICD-10-CM

## 2021-05-22 DIAGNOSIS — E1169 Type 2 diabetes mellitus with other specified complication: Secondary | ICD-10-CM

## 2021-05-22 DIAGNOSIS — E1165 Type 2 diabetes mellitus with hyperglycemia: Secondary | ICD-10-CM

## 2021-05-22 DIAGNOSIS — Z794 Long term (current) use of insulin: Secondary | ICD-10-CM

## 2021-05-22 DIAGNOSIS — B349 Viral infection, unspecified: Secondary | ICD-10-CM

## 2021-05-22 DIAGNOSIS — E785 Hyperlipidemia, unspecified: Secondary | ICD-10-CM

## 2021-05-22 DIAGNOSIS — H11002 Unspecified pterygium of left eye: Secondary | ICD-10-CM

## 2021-05-22 DIAGNOSIS — E039 Hypothyroidism, unspecified: Secondary | ICD-10-CM

## 2021-05-22 LAB — GLUCOSE, POCT (MANUAL RESULT ENTRY): POC Glucose: 167 mg/dl — AB (ref 70–99)

## 2021-05-22 LAB — POCT INFLUENZA A/B
Influenza A, POC: NEGATIVE
Influenza B, POC: NEGATIVE

## 2021-05-22 LAB — POCT GLYCOSYLATED HEMOGLOBIN (HGB A1C): HbA1c, POC (controlled diabetic range): 7.3 % — AB (ref 0.0–7.0)

## 2021-05-22 MED ORDER — LISINOPRIL-HYDROCHLOROTHIAZIDE 20-25 MG PO TABS
1.0000 | ORAL_TABLET | Freq: Every day | ORAL | 1 refills | Status: DC
  Filled 2021-05-22: qty 90, 90d supply, fill #0
  Filled 2021-09-25: qty 90, 90d supply, fill #1

## 2021-05-22 MED ORDER — BENZONATATE 100 MG PO CAPS
100.0000 mg | ORAL_CAPSULE | Freq: Two times a day (BID) | ORAL | 0 refills | Status: DC | PRN
  Filled 2021-05-22: qty 20, 10d supply, fill #0

## 2021-05-22 MED ORDER — PREDNISONE 20 MG PO TABS
20.0000 mg | ORAL_TABLET | Freq: Every day | ORAL | 0 refills | Status: DC
  Filled 2021-05-22: qty 5, 5d supply, fill #0

## 2021-05-22 MED ORDER — AMLODIPINE BESYLATE 10 MG PO TABS
ORAL_TABLET | Freq: Every day | ORAL | 1 refills | Status: DC
  Filled 2021-05-22: qty 90, fill #0
  Filled 2021-11-01: qty 90, 90d supply, fill #0
  Filled 2022-01-15: qty 90, 90d supply, fill #1

## 2021-05-22 MED ORDER — ATORVASTATIN CALCIUM 80 MG PO TABS
ORAL_TABLET | Freq: Every day | ORAL | 1 refills | Status: DC
  Filled 2021-05-22: qty 90, 90d supply, fill #0
  Filled 2021-11-01: qty 90, 90d supply, fill #1

## 2021-05-22 NOTE — Progress Notes (Signed)
Has cough and flu like symptoms for 3 days. ?Had fever last night ?

## 2021-05-22 NOTE — Progress Notes (Signed)
? ?Subjective:  ?Patient ID: Linda House, female    DOB: Oct 12, 1945  Age: 76 y.o. MRN: [id number] ? ?CC: Diabetes ? ? ?HPI ?Linda House is a 76 y.o. year old female with a history of type 2 diabetes mellitus (A1c 7.2), hypothyroidism, hyperlipidemia, hypertension. ? ?Interval History: ?She has had a cough and flu like symptoms x4 days. Cough is dry but she does have post nasal drip.Se has no dyspnea, wheezing, chest pain but does have itchy ears. ?She has associated rhinorrhea, fatigue. ?Denies h/o sick contact. ?Received 3 doses of the Pfizer vaccine. ? ? ?Complains she is barely able to see from L eye but denies pain out of that eye. ? ?Wilder Glade was changed to Jardiance due to in availability of the former from the pharmacy via medication assistance.  She endorses compliance with her diabetes regimen.  She has no hypoglycemic symptoms, no neuropathy.  Tolerating her statin and antihypertensive okay. ?Past Medical History:  ?Diagnosis Date  ? Diabetes mellitus   ? Diabetes mellitus without complication ([place])   ? Hypertension   ? ? ?History reviewed. No pertinent surgical history. ? ?Family History  ?Problem Relation Age of Onset  ? Diabetes Father   ? Hypertension Father   ? ? ?Social History  ? ?Socioeconomic History  ? Marital status: Married  ?  Spouse name: Not on file  ? Number of children: Not on file  ? Years of education: Not on file  ? Highest education level: Not on file  ?Occupational History  ? Not on file  ?Tobacco Use  ? Smoking status: Never  ? Smokeless tobacco: Never  ?Vaping Use  ? Vaping Use: Never used  ?Substance and Sexual Activity  ? Alcohol use: No  ? Drug use: No  ? Sexual activity: Never  ?Other Topics Concern  ? Not on file  ?Social History Narrative  ? ** Merged History Encounter **  ?    ? ?Social Determinants of Health  ? ?Financial Resource Strain: Not on file  ?Food Insecurity: Not on file  ?Transportation Needs: Not on file  ?Physical Activity: Not on  file  ?Stress: Not on file  ?Social Connections: Not on file  ? ? ?No Known Allergies ? ?Outpatient Medications Prior to Visit  ?Medication Sig Dispense Refill  ? aspirin EC 81 MG tablet Take 1 tablet (81 mg total) by mouth daily. 30 tablet 3  ? Blood Glucose Monitoring Suppl (TRUE METRIX METER) w/Device KIT Use to check blood sugar daily. E11.65 1 kit 0  ? cyclobenzaprine (FLEXERIL) 10 MG tablet TAKE 1 TABLET BY MOUTH AT BEDTIME. 30 tablet 0  ? diclofenac Sodium (VOLTAREN) 1 % GEL Apply 4 g topically 4 (four) times daily. 150 g 1  ? empagliflozin (JARDIANCE) 10 MG TABS tablet Take 1 tablet (10 mg total) by mouth daily before breakfast. 30 tablet 2  ? gabapentin (NEURONTIN) 300 MG capsule Take 1 capsule (300 mg total) by mouth at bedtime. 30 capsule 6  ? glipiZIDE (GLIPIZIDE XL) 10 MG 24 hr tablet TAKE 2 TABLETS (20 MG TOTAL) BY MOUTH DAILY. 60 tablet 6  ? glucose blood test strip Use as instructed to check blood sugar daily. E11.65 100 each 11  ? levothyroxine (SYNTHROID) 125 MCG tablet Take 1 tablet (125 mcg total) by mouth daily before breakfast. 90 tablet 0  ? meloxicam (MOBIC) 15 MG tablet Take 1 tablet (15 mg total) by mouth daily. 90 tablet 1  ? metFORMIN (GLUCOPHAGE) 1000 MG tablet Take 1  tablet (1,000 mg total) by mouth 2 (two) times daily with a meal. 180 tablet 0  ? olopatadine (PATANOL) 0.1 % ophthalmic solution Place 1 drop into both eyes 2 (two) times daily. 5 mL 1  ? pioglitazone (ACTOS) 45 MG tablet Take 1 tablet (45 mg total) by mouth daily. 90 tablet 0  ? spironolactone (ALDACTONE) 25 MG tablet Take 1 tablet (25 mg total) by mouth daily. 30 tablet 3  ? traZODone (DESYREL) 50 MG tablet Take 0.5-1 tablets (25-50 mg total) by mouth at bedtime as needed for sleep. 30 tablet 3  ? TRUEplus Lancets 28G MISC Use to check blood sugar daily. E11.65 100 each 11  ? amLODipine (NORVASC) 10 MG tablet TAKE 1 TABLET (10 MG TOTAL) BY MOUTH DAILY. 90 tablet 0  ? atorvastatin (LIPITOR) 80 MG tablet TAKE 1 TABLET (80  MG TOTAL) BY MOUTH DAILY. 90 tablet 0  ? lisinopril-hydrochlorothiazide (ZESTORETIC) 20-25 MG tablet TAKE 1 TABLET BY MOUTH DAILY. 90 tablet 0  ? metFORMIN (GLUCOPHAGE) 1000 MG tablet TAKE 1 TABLET (1,000 MG TOTAL) BY MOUTH 2 (TWO) TIMES DAILY WITH A MEAL. 60 tablet 6  ? pioglitazone (ACTOS) 45 MG tablet TAKE 1 TABLET (45 MG TOTAL) BY MOUTH DAILY. 30 tablet 6  ? ?No facility-administered medications prior to visit.  ? ? ? ?ROS ?Review of Systems  ?Constitutional:  Negative for activity change, appetite change and fatigue.  ?HENT:  Negative for congestion, sinus pressure and sore throat.   ?Eyes:  Positive for visual disturbance.  ?Respiratory:  Negative for cough, chest tightness, shortness of breath and wheezing.   ?Cardiovascular:  Negative for chest pain and palpitations.  ?Gastrointestinal:  Negative for abdominal distention, abdominal pain and constipation.  ?Endocrine: Negative for polydipsia.  ?Genitourinary:  Negative for dysuria and frequency.  ?Musculoskeletal:  Negative for arthralgias and back pain.  ?Skin:  Negative for rash.  ?Neurological:  Negative for tremors, light-headedness and numbness.  ?Hematological:  Does not bruise/bleed easily.  ?Psychiatric/Behavioral:  Negative for agitation and behavioral problems.   ? ?Objective:  ?BP 119/72   Pulse 87   Temp 97.8 ?F (36.6 ?C) (Oral)   Ht 5' (1.524 m)   Wt 152 lb (68.9 kg)   SpO2 96%   BMI 29.69 kg/m?  ? ? ?  05/22/2021  ?  8:41 AM 11/21/2020  ? 10:27 AM 11/03/2020  ?  2:17 AM  ?BP/Weight  ?Systolic BP 638 177 116  ?Diastolic BP 72 61 53  ?Wt. (Lbs) 152 155.8   ?BMI 29.69 kg/m2 30.43 kg/m2   ? ? ? ? ?Physical Exam ?Constitutional:   ?   Appearance: She is well-developed.  ?Eyes:  ?   Comments: Pterygium from lateral aspect of left eye encroaching over pupil  ?Cardiovascular:  ?   Rate and Rhythm: Normal rate.  ?   Heart sounds: Normal heart sounds. No murmur heard. ?Pulmonary:  ?   Effort: Pulmonary effort is normal.  ?   Breath sounds: Normal  breath sounds. No wheezing or rales.  ?Chest:  ?   Chest wall: No tenderness.  ?Abdominal:  ?   General: Bowel sounds are normal. There is no distension.  ?   Palpations: Abdomen is soft. There is no mass.  ?   Tenderness: There is no abdominal tenderness.  ?Musculoskeletal:     ?   General: Normal range of motion.  ?   Right lower leg: No edema.  ?   Left lower leg: No edema.  ?Neurological:  ?  Mental Status: She is alert and oriented to person, place, and time.  ?Psychiatric:     ?   Mood and Affect: Mood normal.  ? ? ? ?  Latest Ref Rng & Units 11/27/2020  ?  9:32 AM 11/02/2020  ?  9:09 PM 05/24/2020  ?  9:18 AM  ?CMP  ?Glucose 70 - 99 mg/dL 87   120   85    ?BUN 8 - 27 mg/dL _0 ?Creatinine 0.57 - 1.00 mg/dL 0.74   0.70   0.70    ?Sodium 134 - 144 mmol/L 143   137   145    ?Potassium 3.5 - 5.2 mmol/L 4.5   4.0   4.9    ?Chloride 96 - 106 mmol/L 102   104   104    ?CO2 20 - 29 mmol/L _1 ?Calcium 8.7 - 10.3 mg/dL 9.2   9.2   9.4    ?Total Protein 6.0 - 8.5 g/dL   7.0    ?Total Bilirubin 0.0 - 1.2 mg/dL   0.3    ?Alkaline Phos 44 - 121 IU/L   172    ?AST 0 - 40 IU/L   17    ?ALT 0 - 32 IU/L   15    ? ? ?Lipid Panel  ?   ?Component Value Date/Time  ? CHOL 186 05/24/2020 0918  ? TRIG 137 05/24/2020 0918  ? HDL 55 05/24/2020 0918  ? CHOLHDL 3.4 05/24/2020 0918  ? CHOLHDL 3.4 02/19/2016 1021  ? VLDL 25 08/29/2015 0849  ? LDLCALC 107 (H) 05/24/2020 2763  ? ? ?CBC ?   ?Component Value Date/Time  ? WBC 7.7 11/02/2020 2109  ? RBC 5.00 11/02/2020 2109  ? HGB 12.9 11/02/2020 2109  ? HGB 12.2 08/27/2016 0919  ? HCT 40.7 11/02/2020 2109  ? HCT 38.2 08/27/2016 0919  ? PLT 312 11/02/2020 2109  ? PLT 355 08/27/2016 0919  ? MCV 81.4 11/02/2020 2109  ? MCV 79 08/27/2016 0919  ? MCH 25.8 (L) 11/02/2020 2109  ? MCHC 31.7 11/02/2020 2109  ? RDW 14.7 11/02/2020 2109  ? RDW 15.2 08/27/2016 0919  ? LYMPHSABS 3.0 11/02/2020 2109  ? LYMPHSABS 2.5 08/27/2016 0919  ? MONOABS 0.5 11/02/2020 2109  ? EOSABS 0.2  11/02/2020 2109  ? EOSABS 0.2 08/27/2016 0919  ? BASOSABS 0.0 11/02/2020 2109  ? BASOSABS 0.0 08/27/2016 0919  ? ? ?Lab Results  ?Component Value Date  ? HGBA1C 7.3 (A) 05/22/2021  ? ? ?Lab Results  ?Component Value Date

## 2021-05-23 ENCOUNTER — Other Ambulatory Visit: Payer: Self-pay | Admitting: Family Medicine

## 2021-05-23 ENCOUNTER — Other Ambulatory Visit: Payer: Self-pay

## 2021-05-23 DIAGNOSIS — E039 Hypothyroidism, unspecified: Secondary | ICD-10-CM

## 2021-05-23 LAB — LP+NON-HDL CHOLESTEROL
Cholesterol, Total: 218 mg/dL — ABNORMAL HIGH (ref 100–199)
HDL: 56 mg/dL (ref >39)
LDL Chol Calc (NIH): 136 mg/dL — ABNORMAL HIGH (ref 0–99)
Total Non-HDL-Chol (LDL+VLDL): 162 mg/dL — ABNORMAL HIGH (ref 0–129)
Triglycerides: 144 mg/dL (ref 0–149)
VLDL Cholesterol Cal: 26 mg/dL (ref 5–40)

## 2021-05-23 LAB — MICROALBUMIN / CREATININE URINE RATIO
Creatinine, Urine: 229 mg/dL
Microalb/Creat Ratio: 26 mg/g creat (ref 0–29)
Microalbumin, Urine: 58.7 ug/mL

## 2021-05-23 LAB — CMP14+EGFR
ALT: 21 IU/L (ref 0–32)
AST: 18 IU/L (ref 0–40)
Albumin/Globulin Ratio: 1.2 (ref 1.2–2.2)
Albumin: 4.1 g/dL (ref 3.7–4.7)
Alkaline Phosphatase: 179 IU/L — ABNORMAL HIGH (ref 44–121)
BUN/Creatinine Ratio: 13 (ref 12–28)
BUN: 10 mg/dL (ref 8–27)
Bilirubin Total: 0.4 mg/dL (ref 0.0–1.2)
CO2: 26 mmol/L (ref 20–29)
Calcium: 9.2 mg/dL (ref 8.7–10.3)
Chloride: 100 mmol/L (ref 96–106)
Creatinine, Ser: 0.8 mg/dL (ref 0.57–1.00)
Globulin, Total: 3.3 g/dL (ref 1.5–4.5)
Glucose: 140 mg/dL — ABNORMAL HIGH (ref 70–99)
Potassium: 4.7 mmol/L (ref 3.5–5.2)
Sodium: 138 mmol/L (ref 134–144)
Total Protein: 7.4 g/dL (ref 6.0–8.5)
eGFR: 77 mL/min/{1.73_m2} (ref >59)

## 2021-05-23 LAB — TSH: TSH: 2.84 u[IU]/mL (ref 0.450–4.500)

## 2021-05-23 LAB — T4, FREE: Free T4: 1.25 ng/dL (ref 0.82–1.77)

## 2021-05-23 MED ORDER — LEVOTHYROXINE SODIUM 125 MCG PO TABS
125.0000 ug | ORAL_TABLET | Freq: Every day | ORAL | 0 refills | Status: DC
  Filled 2021-05-23 – 2021-08-02 (×2): qty 90, 90d supply, fill #0

## 2021-05-24 LAB — NOVEL CORONAVIRUS, NAA: SARS-CoV-2, NAA: NOT DETECTED

## 2021-06-18 ENCOUNTER — Other Ambulatory Visit: Payer: Self-pay

## 2021-07-18 ENCOUNTER — Other Ambulatory Visit: Payer: Self-pay

## 2021-07-23 ENCOUNTER — Other Ambulatory Visit: Payer: Self-pay | Admitting: Pharmacist

## 2021-07-23 ENCOUNTER — Other Ambulatory Visit: Payer: Self-pay

## 2021-07-23 DIAGNOSIS — E1165 Type 2 diabetes mellitus with hyperglycemia: Secondary | ICD-10-CM

## 2021-07-23 MED ORDER — DAPAGLIFLOZIN PROPANEDIOL 5 MG PO TABS
ORAL_TABLET | Freq: Every day | ORAL | 6 refills | Status: DC
  Filled 2021-07-23: qty 90, 90d supply, fill #0

## 2021-07-30 ENCOUNTER — Other Ambulatory Visit: Payer: Self-pay

## 2021-08-02 ENCOUNTER — Other Ambulatory Visit: Payer: Self-pay

## 2021-08-30 ENCOUNTER — Encounter (HOSPITAL_COMMUNITY): Payer: Self-pay

## 2021-08-30 ENCOUNTER — Ambulatory Visit (HOSPITAL_COMMUNITY)
Admission: EM | Admit: 2021-08-30 | Discharge: 2021-08-30 | Disposition: A | Discharge status: Home / Self Care | Payer: No Typology Code available for payment source | Attending: Family Medicine | Admitting: Family Medicine

## 2021-08-30 ENCOUNTER — Other Ambulatory Visit: Payer: Self-pay

## 2021-08-30 DIAGNOSIS — W57XXXA Bitten or stung by nonvenomous insect and other nonvenomous arthropods, initial encounter: Secondary | ICD-10-CM

## 2021-08-30 DIAGNOSIS — S80861A Insect bite (nonvenomous), right lower leg, initial encounter: Secondary | ICD-10-CM

## 2021-08-30 MED ORDER — DOXYCYCLINE HYCLATE 100 MG PO TABS
100.0000 mg | ORAL_TABLET | Freq: Two times a day (BID) | ORAL | 0 refills | Status: DC
  Filled 2021-08-30: qty 20, 10d supply, fill #0

## 2021-08-30 NOTE — ED Provider Notes (Signed)
[facility]    CSN: [id number] Arrival date & time: 08/30/21  0912      History   Chief Complaint Chief Complaint  Patient presents with   Insect Bite    HPI Linda House is a 76 y.o. female.   Patient is here with her daughter.  She noted a tick bite at the back of her right leg.  This was removed yesterday.   Today there is redness around the site.  They have no idea how long the tick was there.  The tick was small, and not engorged.  She is feeling well overall.    Past Medical History:  Diagnosis Date   Diabetes mellitus    Diabetes mellitus without complication ([place])    Hypertension     Patient Active Problem List   Diagnosis Date Noted   Dizziness 09/11/2013   Nausea 09/11/2013   Primary osteoarthritis of left knee 07/26/2013   Diabetes mellitus without complication ([place]) 73/41/9379   Vaginal discharge 04/26/2013   Diabetes ([place]) 11/19/2012   Essential hypertension, benign 11/19/2012   Dyslipidemia 11/19/2012   Osteoporosis, unspecified 11/19/2012   ACUTE GINGIVITIS PLAQUE INDUCED 10/16/2009   PLANTAR FASCIITIS, RIGHT 07/03/2009   ABDOMINAL BRUIT 03/28/2009   URINALYSIS, ABNORMAL 03/28/2009   MICROALBUMINURIA 03/21/2009   VARICOSE VEINS, LOWER EXTREMITIES 03/20/2009   ABSCESS, SKIN 03/20/2009   MUSCLE STRAIN, HAMSTRING MUSCLE 11/30/2007   OSTEOPENIA 06/22/2007   CYSTITIS 01/26/2007   HEMATURIA UNSPECIFIED 01/26/2007   Hypothyroidism 07/23/2006   DIABETES MELLITUS, TYPE II 07/23/2006   Diabetic radiculopathy ([place]) 07/23/2006   HYPERLIPIDEMIA 07/23/2006   HYPERTENSION 07/23/2006   CEREBROVASCULAR DISEASE 07/23/2006   MEDIAL EPICONDYLITIS, LEFT 07/23/2006   TRANSIENT ISCHEMIC ATTACK, HX OF 07/23/2006    History reviewed. No pertinent surgical history.  OB History   No obstetric history on file.      Home Medications    Prior to Admission medications   Medication Sig Start Date End Date Taking? Authorizing Provider   amLODipine (NORVASC) 10 MG tablet TAKE 1 TABLET (10 MG TOTAL) BY MOUTH DAILY. 05/22/21 05/22/22  Charlott Rakes, MD  aspirin EC 81 MG tablet Take 1 tablet (81 mg total) by mouth daily. 08/11/15   Charlott Rakes, MD  atorvastatin (LIPITOR) 80 MG tablet TAKE 1 TABLET (80 MG TOTAL) BY MOUTH DAILY. 05/22/21 05/22/22  Charlott Rakes, MD  benzonatate (TESSALON) 100 MG capsule Take 1 capsule (100 mg total) by mouth 2 (two) times daily as needed for cough. 05/22/21   Charlott Rakes, MD  Blood Glucose Monitoring Suppl (TRUE METRIX METER) w/Device KIT Use to check blood sugar daily. E11.65 02/11/19   Charlott Rakes, MD  cyclobenzaprine (FLEXERIL) 10 MG tablet TAKE 1 TABLET BY MOUTH AT BEDTIME. 09/23/18   Charlott Rakes, MD  dapagliflozin propanediol (FARXIGA) 5 MG TABS tablet TAKE 1 TABLET (5 MG TOTAL) BY MOUTH DAILY BEFORE BREAKFAST. 07/23/21 07/23/22  Charlott Rakes, MD  diclofenac Sodium (VOLTAREN) 1 % GEL Apply 4 g topically 4 (four) times daily. 05/04/19   Charlott Rakes, MD  gabapentin (NEURONTIN) 300 MG capsule Take 1 capsule (300 mg total) by mouth at bedtime. 05/24/20   Charlott Rakes, MD  glipiZIDE (GLIPIZIDE XL) 10 MG 24 hr tablet TAKE 2 TABLETS (20 MG TOTAL) BY MOUTH DAILY. 04/26/21 04/26/22  Charlott Rakes, MD  glucose blood test strip Use as instructed to check blood sugar daily. E11.65 02/11/19   Charlott Rakes, MD  levothyroxine (SYNTHROID) 125 MCG tablet Take 1 tablet (125 mcg total) by  mouth daily before breakfast. 05/23/21 05/23/22  Charlott Rakes, MD  lisinopril-hydrochlorothiazide (ZESTORETIC) 20-25 MG tablet TAKE 1 TABLET BY MOUTH DAILY. 05/22/21 05/22/22  Charlott Rakes, MD  metFORMIN (GLUCOPHAGE) 1000 MG tablet TAKE 1 TABLET (1,000 MG TOTAL) BY MOUTH 2 (TWO) TIMES DAILY WITH A MEAL. 07/26/19 07/25/20  Charlott Rakes, MD  metFORMIN (GLUCOPHAGE) 1000 MG tablet Take 1 tablet (1,000 mg total) by mouth 2 (two) times daily with a meal. 04/26/21   [place], Charlane Ferretti, MD  olopatadine (PATANOL) 0.1 % ophthalmic  solution Place 1 drop into both eyes 2 (two) times daily. 11/21/20   Charlott Rakes, MD  pioglitazone (ACTOS) 45 MG tablet TAKE 1 TABLET (45 MG TOTAL) BY MOUTH DAILY. 07/26/19 07/25/20  Charlott Rakes, MD  pioglitazone (ACTOS) 45 MG tablet Take 1 tablet (45 mg total) by mouth daily. 04/26/21   Charlott Rakes, MD  predniSONE (DELTASONE) 20 MG tablet Take 1 tablet (20 mg total) by mouth daily with breakfast. 05/22/21   Charlott Rakes, MD  spironolactone (ALDACTONE) 25 MG tablet Take 1 tablet (25 mg total) by mouth daily. 11/21/20   Charlott Rakes, MD  traZODone (DESYREL) 50 MG tablet Take 0.5-1 tablets (25-50 mg total) by mouth at bedtime as needed for sleep. 05/24/20   Charlott Rakes, MD  TRUEplus Lancets 28G MISC Use to check blood sugar daily. E11.65 02/11/19   Charlott Rakes, MD  Calcium 500-125 MG-UNIT TABS Take 1 tablet by mouth daily. Patient not taking: Reported on 12/31/2013 11/19/12 05/30/14  Tresa Garter, MD    Family History Family History  Problem Relation Age of Onset   Diabetes Father    Hypertension Father     Social History Social History   Tobacco Use   Smoking status: Never   Smokeless tobacco: Never  Vaping Use   Vaping Use: Never used  Substance Use Topics   Alcohol use: No   Drug use: No     Allergies   Patient has no known allergies.   Review of Systems Review of Systems  Constitutional: Negative.   HENT: Negative.    Respiratory: Negative.    Cardiovascular: Negative.   Gastrointestinal: Negative.   Endocrine: Negative.   Genitourinary: Negative.   Musculoskeletal: Negative.   Skin:  Positive for rash.  Neurological: Negative.   Hematological: Negative.      Physical Exam Triage Vital Signs ED Triage Vitals [08/30/21 0924]  Enc Vitals Group     BP (!) 117/56     Pulse Rate 85     Resp 16     Temp 98 F (36.7 C)     Temp Source Oral     SpO2 97 %     Weight      Height      Head Circumference      Peak Flow      Pain Score 0      Pain Loc      Pain Edu?      Excl. in [place]?    No data found.  Updated Vital Signs BP (!) 117/56 (BP Location: Left Arm)   Pulse 85   Temp 98 F (36.7 C) (Oral)   Resp 16   SpO2 97%   Visual Acuity Right Eye Distance:   Left Eye Distance:   Bilateral Distance:    Right Eye Near:   Left Eye Near:    Bilateral Near:     Physical Exam Constitutional:      Appearance: Normal appearance.  Cardiovascular:  Rate and Rhythm: Normal rate and regular rhythm.  Pulmonary:     Effort: Pulmonary effort is normal.     Breath sounds: Normal breath sounds.  Musculoskeletal:     Cervical back: Normal range of motion.  Skin:    General: Skin is warm.     Comments: At the back of the right lower leg is a central bite, with slight clearing, and then surrounded by 7 cm of erythema and bruising.  No warmth, no tenderness noted per se.   Neurological:     General: No focal deficit present.     Mental Status: She is alert.  Psychiatric:        Mood and Affect: Mood normal.      UC Treatments / Results  Labs (all labs ordered are listed, but only abnormal results are displayed) Labs Reviewed - No data to display  EKG   Radiology No results found.  Procedures Procedures (including critical care time)  Medications Ordered in UC Medications - No data to display  Initial Impression / Assessment and Plan / UC Course  I have reviewed the triage vital signs and the nursing notes.  Pertinent labs & imaging results that were available during my care of the patient were reviewed by me and considered in my medical decision making (see chart for details).   Final Clinical Impressions(s) / UC Diagnoses   Final diagnoses:  Tick bite of right lower leg, initial encounter     Discharge Instructions      Te vieron hoy por una picadura de garrapata. Le he enviado un antibitico para que lo [place] x [employer]. Hoy tambin he marcado la zona de enrojecimiento. Esto  puede empeorar levemente, pero debera mejorar en las prximas 24 a 48 horas. Si esto est empeorando, o si comienza a sentirse muy mal, regrese para una reevaluacin.  You were seen today for a tick bite.  I have sent out an antibiotic for you to take twice/day x 10 days.  I have also  marked the area of redness today.  This may worsen slightly, but should improve in the next 24-48 hrs.  If this is worsening, or if you start to feel very poorly, then please return for re-evaluation.      ED Prescriptions     Medication Sig Dispense Auth. Provider   doxycycline (VIBRA-TABS) 100 MG tablet Take 1 tablet (100 mg total) by mouth 2 (two) times daily. 20 tablet Rondel Oh, MD      PDMP not reviewed this encounter.   Rondel Oh, MD 08/30/21 [phone]

## 2021-08-30 NOTE — Discharge Instructions (Addendum)
Te vieron hoy por una picadura de garrapata. Le he enviado un antibitico para que lo [place] x [employer]. Hoy tambin he marcado la zona de enrojecimiento. Esto puede empeorar levemente, pero debera mejorar en las prximas 24 a 48 horas. Si esto est empeorando, o si comienza a sentirse muy mal, regrese para una reevaluacin.  You were seen today for a tick bite.  I have sent out an antibiotic for you to take twice/day x 10 days.  I have also  marked the area of redness today.  This may worsen slightly, but should improve in the next 24-48 hrs.  If this is worsening, or if you start to feel very poorly, then please return for re-evaluation.

## 2021-08-30 NOTE — ED Triage Notes (Signed)
Pt states she was had a tick on the back of her right lower leg.   Her daughter removed the tick but the area is red.

## 2021-09-11 ENCOUNTER — Ambulatory Visit (HOSPITAL_COMMUNITY)
Admission: EM | Admit: 2021-09-11 | Discharge: 2021-09-11 | Disposition: A | Discharge status: Home / Self Care | Payer: No Typology Code available for payment source | Attending: Family Medicine | Admitting: Family Medicine

## 2021-09-11 ENCOUNTER — Emergency Department (HOSPITAL_COMMUNITY): Payer: No Typology Code available for payment source

## 2021-09-11 ENCOUNTER — Other Ambulatory Visit: Payer: Self-pay

## 2021-09-11 ENCOUNTER — Encounter (HOSPITAL_COMMUNITY): Payer: Self-pay

## 2021-09-11 ENCOUNTER — Emergency Department (HOSPITAL_COMMUNITY)
Admission: EM | Admit: 2021-09-11 | Discharge: 2021-09-11 | Disposition: A | Discharge status: Home / Self Care | Payer: No Typology Code available for payment source | Attending: Emergency Medicine | Admitting: Emergency Medicine

## 2021-09-11 DIAGNOSIS — R42 Dizziness and giddiness: Secondary | ICD-10-CM

## 2021-09-11 DIAGNOSIS — S060X9A Concussion with loss of consciousness of unspecified duration, initial encounter: Secondary | ICD-10-CM | POA: Insufficient documentation

## 2021-09-11 DIAGNOSIS — R519 Headache, unspecified: Secondary | ICD-10-CM

## 2021-09-11 DIAGNOSIS — Z79899 Other long term (current) drug therapy: Secondary | ICD-10-CM | POA: Insufficient documentation

## 2021-09-11 DIAGNOSIS — H538 Other visual disturbances: Secondary | ICD-10-CM

## 2021-09-11 DIAGNOSIS — S0990XA Unspecified injury of head, initial encounter: Secondary | ICD-10-CM

## 2021-09-11 DIAGNOSIS — Z7982 Long term (current) use of aspirin: Secondary | ICD-10-CM | POA: Insufficient documentation

## 2021-09-11 DIAGNOSIS — W01198A Fall on same level from slipping, tripping and stumbling with subsequent striking against other object, initial encounter: Secondary | ICD-10-CM | POA: Insufficient documentation

## 2021-09-11 DIAGNOSIS — Z7984 Long term (current) use of oral hypoglycemic drugs: Secondary | ICD-10-CM | POA: Insufficient documentation

## 2021-09-11 DIAGNOSIS — I10 Essential (primary) hypertension: Secondary | ICD-10-CM | POA: Insufficient documentation

## 2021-09-11 DIAGNOSIS — E119 Type 2 diabetes mellitus without complications: Secondary | ICD-10-CM | POA: Insufficient documentation

## 2021-09-11 LAB — CBC
HCT: 39.2 % (ref 36.0–46.0)
Hemoglobin: 12.7 g/dL (ref 12.0–15.0)
MCH: 26.5 pg (ref 26.0–34.0)
MCHC: 32.4 g/dL (ref 30.0–36.0)
MCV: 81.7 fL (ref 80.0–100.0)
Platelets: 326 10*3/uL (ref 150–400)
RBC: 4.8 MIL/uL (ref 3.87–5.11)
RDW: 14.4 % (ref 11.5–15.5)
WBC: 8.8 10*3/uL (ref 4.0–10.5)
nRBC: 0 % (ref 0.0–0.2)

## 2021-09-11 LAB — URINALYSIS, ROUTINE W REFLEX MICROSCOPIC
Bacteria, UA: NONE SEEN
Bilirubin Urine: NEGATIVE
Glucose, UA: NEGATIVE mg/dL
Hgb urine dipstick: NEGATIVE
Ketones, ur: NEGATIVE mg/dL
Nitrite: NEGATIVE
Protein, ur: NEGATIVE mg/dL
Specific Gravity, Urine: 1.02 (ref 1.005–1.030)
pH: 6 (ref 5.0–8.0)

## 2021-09-11 LAB — BASIC METABOLIC PANEL
Anion gap: 13 (ref 5–15)
BUN: 17 mg/dL (ref 8–23)
CO2: 25 mmol/L (ref 22–32)
Calcium: 9.4 mg/dL (ref 8.9–10.3)
Chloride: 102 mmol/L (ref 98–111)
Creatinine, Ser: 0.89 mg/dL (ref 0.44–1.00)
GFR, Estimated: >60 mL/min (ref >60)
Glucose, Bld: 208 mg/dL — ABNORMAL HIGH (ref 70–99)
Potassium: 4.3 mmol/L (ref 3.5–5.1)
Sodium: 140 mmol/L (ref 135–145)

## 2021-09-11 LAB — CBG MONITORING, ED: Glucose-Capillary: 186 mg/dL — ABNORMAL HIGH (ref 70–99)

## 2021-09-11 MED ORDER — MELOXICAM 15 MG PO TABS
15.0000 mg | ORAL_TABLET | Freq: Every day | ORAL | 0 refills | Status: AC | PRN
  Filled 2021-09-11: qty 14, 14d supply, fill #0

## 2021-09-11 MED ORDER — MECLIZINE HCL 25 MG PO TABS
25.0000 mg | ORAL_TABLET | Freq: Three times a day (TID) | ORAL | 0 refills | Status: DC | PRN
  Filled 2021-09-11: qty 30, 10d supply, fill #0

## 2021-09-11 NOTE — ED Provider Notes (Signed)
[facility]    CSN: [id number] Arrival date & time: 09/11/21  1246      History   Chief Complaint Chief Complaint  Patient presents with   Head Injury   Fall   Dizziness    HPI Linda House is a 76 y.o. female.   She is here for dizziness.  She fell 1 week ago, landed on her face.  Did not see her dr after this.  Ever since that day she has felt dizzy.  It has been getting worse, and today is the worst day.  Room is spinning.  She feels a lot of pressure on her head.  Some times she has blurry vision.  Some nausea, no vomiting.  She also noted some tingling around her mouth, which comes on when she feels the pressure sensation at her head.  She feels confused, and feels super drained and tired.  She states symptoms come/go.    Past Medical History:  Diagnosis Date   Diabetes mellitus    Diabetes mellitus without complication ([place])    Hypertension     Patient Active Problem List   Diagnosis Date Noted   Dizziness 09/11/2013   Nausea 09/11/2013   Primary osteoarthritis of left knee 07/26/2013   Diabetes mellitus without complication ([place]) 59/29/2446   Vaginal discharge 04/26/2013   Diabetes ([place]) 11/19/2012   Essential hypertension, benign 11/19/2012   Dyslipidemia 11/19/2012   Osteoporosis, unspecified 11/19/2012   ACUTE GINGIVITIS PLAQUE INDUCED 10/16/2009   PLANTAR FASCIITIS, RIGHT 07/03/2009   ABDOMINAL BRUIT 03/28/2009   URINALYSIS, ABNORMAL 03/28/2009   MICROALBUMINURIA 03/21/2009   VARICOSE VEINS, LOWER EXTREMITIES 03/20/2009   ABSCESS, SKIN 03/20/2009   MUSCLE STRAIN, HAMSTRING MUSCLE 11/30/2007   OSTEOPENIA 06/22/2007   CYSTITIS 01/26/2007   HEMATURIA UNSPECIFIED 01/26/2007   Hypothyroidism 07/23/2006   DIABETES MELLITUS, TYPE II 07/23/2006   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 No pertinent surgical history.  OB History   No obstetric history on file.      Home Medications    Prior to Admission medications   Medication Sig Start Date End Date Taking? Authorizing Provider  amLODipine (NORVASC) 10 MG tablet TAKE 1 TABLET (10 MG TOTAL) BY MOUTH DAILY. 05/22/21 05/22/22 Yes Newlin, Enobong, MD  atorvastatin (LIPITOR) 80 MG tablet TAKE 1 TABLET (80 MG TOTAL) BY MOUTH DAILY. 05/22/21 05/22/22 Yes Newlin, Enobong, MD  dapagliflozin propanediol (FARXIGA) 5 MG TABS tablet TAKE 1 TABLET (5 MG TOTAL) BY MOUTH DAILY BEFORE BREAKFAST. 07/23/21 07/23/22 Yes Newlin, Charlane Ferretti, MD  glipiZIDE (GLIPIZIDE XL) 10 MG 24 hr tablet TAKE 2 TABLETS (20 MG TOTAL) BY MOUTH DAILY. 04/26/21 04/26/22 Yes Charlott Rakes, MD  levothyroxine (SYNTHROID) 125 MCG tablet Take 1 tablet (125 mcg total) by mouth daily before breakfast. 05/23/21 05/23/22 Yes Newlin, Enobong, MD  lisinopril-hydrochlorothiazide (ZESTORETIC) 20-25 MG tablet TAKE 1 TABLET BY MOUTH DAILY. 05/22/21 05/22/22 Yes Charlott Rakes, MD  metFORMIN (GLUCOPHAGE) 1000 MG tablet Take 1 tablet (1,000 mg total) by mouth 2 (two) times daily with a meal. 04/26/21  Yes Newlin, Enobong, MD  pioglitazone (ACTOS) 45 MG tablet Take 1 tablet (45 mg total) by mouth daily. 04/26/21  Yes Charlott Rakes, MD  spironolactone (ALDACTONE) 25 MG tablet Take 1 tablet (25 mg total) by mouth daily. 11/21/20  Yes Charlott Rakes, MD  aspirin EC  81 MG tablet Take 1 tablet (81 mg total) by mouth daily. 08/11/15   Charlott Rakes, MD  benzonatate (TESSALON) 100 MG capsule Take 1 capsule (100 mg total) by mouth 2 (two) times daily as needed for cough. 05/22/21   Charlott Rakes, MD  Blood Glucose Monitoring Suppl (TRUE METRIX METER) w/Device KIT Use to check blood sugar daily. E11.65 02/11/19   Charlott Rakes, MD  cyclobenzaprine (FLEXERIL) 10 MG tablet TAKE 1 TABLET BY MOUTH AT BEDTIME. 09/23/18   Charlott Rakes, MD  diclofenac  Sodium (VOLTAREN) 1 % GEL Apply 4 g topically 4 (four) times daily. 05/04/19   Charlott Rakes, MD  doxycycline (VIBRA-TABS) 100 MG tablet Take 1 tablet (100 mg total) by mouth 2 (two) times daily. 08/30/21   Deven Furia, Junie Panning, MD  gabapentin (NEURONTIN) 300 MG capsule Take 1 capsule (300 mg total) by mouth at bedtime. 05/24/20   Charlott Rakes, MD  glucose blood test strip Use as instructed to check blood sugar daily. E11.65 02/11/19   Charlott Rakes, MD  metFORMIN (GLUCOPHAGE) 1000 MG tablet TAKE 1 TABLET (1,000 MG TOTAL) BY MOUTH 2 (TWO) TIMES DAILY WITH A MEAL. 07/26/19 07/25/20  Charlott Rakes, MD  olopatadine (PATANOL) 0.1 % ophthalmic solution Place 1 drop into both eyes 2 (two) times daily. 11/21/20   Charlott Rakes, MD  pioglitazone (ACTOS) 45 MG tablet TAKE 1 TABLET (45 MG TOTAL) BY MOUTH DAILY. 07/26/19 07/25/20  Charlott Rakes, MD  predniSONE (DELTASONE) 20 MG tablet Take 1 tablet (20 mg total) by mouth daily with breakfast. 05/22/21   Charlott Rakes, MD  traZODone (DESYREL) 50 MG tablet Take 0.5-1 tablets (25-50 mg total) by mouth at bedtime as needed for sleep. 05/24/20   Charlott Rakes, MD  TRUEplus Lancets 28G MISC Use to check blood sugar daily. E11.65 02/11/19   Charlott Rakes, MD  Calcium 500-125 MG-UNIT TABS Take 1 tablet by mouth daily. Patient not taking: Reported on 12/31/2013 11/19/12 05/30/14  Tresa Garter, MD    Family History Family History  Problem Relation Age of Onset   Diabetes Father    Hypertension Father     Social History Social History   Tobacco Use   Smoking status: Never   Smokeless tobacco: Never  Vaping Use   Vaping Use: Never used  Substance Use Topics   Alcohol use: No   Drug use: No     Allergies   Patient has no known allergies.   Review of Systems Review of Systems  Constitutional: Negative.   HENT: Negative.    Respiratory: Negative.    Cardiovascular: Negative.   Gastrointestinal:  Positive for nausea.  Musculoskeletal:  Negative.   Skin: Negative.   Neurological:  Positive for dizziness, light-headedness, numbness and headaches.     Physical Exam Triage Vital Signs ED Triage Vitals  Enc Vitals Group     BP 09/11/21 1252 (!) 144/71     Pulse Rate 09/11/21 1252 84     Resp 09/11/21 1252 16     Temp 09/11/21 1252 98.3 F (36.8 C)     Temp Source 09/11/21 1252 Oral     SpO2 09/11/21 1252 97 %     Weight --      Height --      Head Circumference --      Peak Flow --      Pain Score 09/11/21 1254 0     Pain Loc --      Pain Edu? --      Excl. in  GC? --    No data found.  Updated Vital Signs BP (!) 144/71 (BP Location: Left Arm)   Pulse 84   Temp 98.3 F (36.8 C) (Oral)   Resp 16   SpO2 97%   Visual Acuity Right Eye Distance:   Left Eye Distance:   Bilateral Distance:    Right Eye Near:   Left Eye Near:    Bilateral Near:     Physical Exam Constitutional:      Appearance: Normal appearance.  HENT:     Head: Normocephalic and atraumatic.  Eyes:     Extraocular Movements: Extraocular movements intact.     Pupils: Pupils are equal, round, and reactive to light.  Cardiovascular:     Rate and Rhythm: Normal rate and regular rhythm.  Pulmonary:     Effort: Pulmonary effort is normal.     Breath sounds: Normal breath sounds.  Musculoskeletal:        General: Normal range of motion.  Skin:    General: Skin is warm.  Neurological:     General: No focal deficit present.     Mental Status: She is alert and oriented to person, place, and time.     Cranial Nerves: No cranial nerve deficit.     Gait: Gait abnormal.  Psychiatric:        Mood and Affect: Mood normal.        Behavior: Behavior normal.      UC Treatments / Results  Labs (all labs ordered are listed, but only abnormal results are displayed) Labs Reviewed  CBG MONITORING, ED - Abnormal; Notable for the following components:      Result Value   Glucose-Capillary 186 (*)    All other components within normal  limits    EKG   Radiology No results found.  Procedures Procedures (including critical care time)  Medications Ordered in UC Medications - No data to display  Initial Impression / Assessment and Plan / UC Course  I have reviewed the triage vital signs and the nursing notes.  Pertinent labs & imaging results that were available during my care of the patient were reviewed by me and considered in my medical decision making (see chart for details).  Patient was seen today for dizziness, headache, blurry vision, and confusion that has been worsening over the last 4 days since a fall last week.  Her symptoms are intermittent.  Given her fall, I have recommend she go to the ER for further evaluation and possible imaging.  She may have a post-concussive syndrome or vertigo, but sent to the the ER to r/o other head injuries.   Final Clinical Impressions(s) / UC Diagnoses   Final diagnoses:  Injury of head, initial encounter  Dizziness  Nonintractable headache, unspecified chronicity pattern, unspecified headache type  Blurry vision     Discharge Instructions      You were seen today for after a fall last week, and now with worsening headache, dizziness, blurry vision and confusion.  I recommend you take her to the ER for further evaluation.     ED Prescriptions   None    PDMP not reviewed this encounter.   Rondel Oh, MD 09/11/21 1324

## 2021-09-11 NOTE — Discharge Instructions (Signed)
You were seen today for after a fall last week, and now with worsening headache, dizziness, blurry vision and confusion.  I recommend you take her to the ER for further evaluation.

## 2021-09-11 NOTE — ED Provider Triage Note (Signed)
Emergency Medicine Provider Triage Evaluation Note  Linda House , a 76 y.o. female  was evaluated in triage.  Pt complains of dizziness and headache after fall.  Reports that around 10 days ago she tripped and fell and hit her head on the ground.  Not on any blood thinners.  Took some anti-inflammatory medication which made her feel better but over the past 4 days she has become dizzy and with headaches.  Seen at urgent care and sent here for imaging  Review of Systems  Positive: Headaches, dizziness Negative: Visual disturbance  Physical Exam  BP (!) 166/59   Pulse 89   Temp 98 F (36.7 C) (Oral)   Resp 16   Ht 5' (1.524 m)   Wt 68.9 kg   SpO2 96%   BMI 29.69 kg/m  Gen:   Awake, no distress   Resp:  Normal effort  MSK:   Moves extremities without difficulty  Other:  Full range of motion of all levels of the spine.  PERRLA, EOMs intact.  5 out of 5 strength in bilateral upper extremities.  No obvious deformities  Medical Decision Making  Medically screening exam initiated at 3:48 PM.  Appropriate orders placed.  Christne Mattisyn Cardona was informed that the remainder of the evaluation will be completed by another provider, this initial triage assessment does not replace that evaluation, and the importance of remaining in the ED until their evaluation is complete.    Imaging and labs   Rhae Hammock, PA-C 09/11/21 1549

## 2021-09-11 NOTE — ED Provider Notes (Signed)
[facility] EMERGENCY DEPARTMENT Provider Note   CSN: [id number] Arrival date & time: 09/11/21  1517     History  Chief Complaint  Patient presents with   Linda House is a 76 y.o. female.   Fall   This patient is a very pleasant 76 year old female, she has a history significant for diabetes on Farxiga, Actos and metformin.  She also has hypertension on lisinopril and hydrochlorothiazide.  Additionally the patient was recently diagnosed with Lyme's disease based on a classic target rash that she had on her foot after a tick bite and was treated with doxycycline.  She still has 4 capsules left but is almost completed it.  She reports that around a week ago she had tripped and fallen, she tripped over a child's toy and landed flat on her face striking her head on the ground.  Shortly thereafter she developed intermittent headaches, vertigo, lightheadedness and dizziness as well as a feeling of her tongue tingling which seems to come and go throughout the day.  It is almost always the same pattern with the headaches.  At this time the patient is asymptomatic.  They went to the urgent care but were told to come to the ER for CT scan based on her symptoms after head injury.  The patient is not anticoagulated.    Home Medications Prior to Admission medications   Medication Sig Start Date End Date Taking? Authorizing Provider  meclizine (ANTIVERT) 25 MG tablet Take 1 tablet (25 mg total) by mouth 3 (three) times daily as needed for dizziness. 09/11/21  Yes Noemi Chapel, MD  meloxicam (MOBIC) 15 MG tablet Take 1 tablet (15 mg total) by mouth daily as needed for up to 14 days for pain. 09/11/21 09/25/21 Yes Noemi Chapel, MD  amLODipine (NORVASC) 10 MG tablet TAKE 1 TABLET (10 MG TOTAL) BY MOUTH DAILY. 05/22/21 05/22/22  Charlott Rakes, MD  aspirin EC 81 MG tablet Take 1 tablet (81 mg total) by mouth daily. 08/11/15   Charlott Rakes, MD  atorvastatin (LIPITOR)  80 MG tablet TAKE 1 TABLET (80 MG TOTAL) BY MOUTH DAILY. 05/22/21 05/22/22  Charlott Rakes, MD  benzonatate (TESSALON) 100 MG capsule Take 1 capsule (100 mg total) by mouth 2 (two) times daily as needed for cough. 05/22/21   Charlott Rakes, MD  Blood Glucose Monitoring Suppl (TRUE METRIX METER) w/Device KIT Use to check blood sugar daily. E11.65 02/11/19   Charlott Rakes, MD  cyclobenzaprine (FLEXERIL) 10 MG tablet TAKE 1 TABLET BY MOUTH AT BEDTIME. 09/23/18   Charlott Rakes, MD  dapagliflozin propanediol (FARXIGA) 5 MG TABS tablet TAKE 1 TABLET (5 MG TOTAL) BY MOUTH DAILY BEFORE BREAKFAST. 07/23/21 07/23/22  Charlott Rakes, MD  diclofenac Sodium (VOLTAREN) 1 % GEL Apply 4 g topically 4 (four) times daily. 05/04/19   Charlott Rakes, MD  doxycycline (VIBRA-TABS) 100 MG tablet Take 1 tablet (100 mg total) by mouth 2 (two) times daily. 08/30/21   Piontek, Junie Panning, MD  gabapentin (NEURONTIN) 300 MG capsule Take 1 capsule (300 mg total) by mouth at bedtime. 05/24/20   Charlott Rakes, MD  glipiZIDE (GLIPIZIDE XL) 10 MG 24 hr tablet TAKE 2 TABLETS (20 MG TOTAL) BY MOUTH DAILY. 04/26/21 04/26/22  Charlott Rakes, MD  glucose blood test strip Use as instructed to check blood sugar daily. E11.65 02/11/19   Charlott Rakes, MD  levothyroxine (SYNTHROID) 125 MCG tablet Take 1 tablet (125 mcg total) by mouth daily before breakfast. 05/23/21 05/23/22  Charlott Rakes, MD  lisinopril-hydrochlorothiazide (ZESTORETIC) 20-25 MG tablet TAKE 1 TABLET BY MOUTH DAILY. 05/22/21 05/22/22  Charlott Rakes, MD  metFORMIN (GLUCOPHAGE) 1000 MG tablet TAKE 1 TABLET (1,000 MG TOTAL) BY MOUTH 2 (TWO) TIMES DAILY WITH A MEAL. 07/26/19 07/25/20  Charlott Rakes, MD  metFORMIN (GLUCOPHAGE) 1000 MG tablet Take 1 tablet (1,000 mg total) by mouth 2 (two) times daily with a meal. 04/26/21   Newlin, Charlane Ferretti, MD  olopatadine (PATANOL) 0.1 % ophthalmic solution Place 1 drop into both eyes 2 (two) times daily. 11/21/20   Charlott Rakes, MD  pioglitazone (ACTOS) 45  MG tablet TAKE 1 TABLET (45 MG TOTAL) BY MOUTH DAILY. 07/26/19 07/25/20  Charlott Rakes, MD  pioglitazone (ACTOS) 45 MG tablet Take 1 tablet (45 mg total) by mouth daily. 04/26/21   Charlott Rakes, MD  predniSONE (DELTASONE) 20 MG tablet Take 1 tablet (20 mg total) by mouth daily with breakfast. 05/22/21   Charlott Rakes, MD  spironolactone (ALDACTONE) 25 MG tablet Take 1 tablet (25 mg total) by mouth daily. 11/21/20   Charlott Rakes, MD  traZODone (DESYREL) 50 MG tablet Take 0.5-1 tablets (25-50 mg total) by mouth at bedtime as needed for sleep. 05/24/20   Charlott Rakes, MD  TRUEplus Lancets 28G MISC Use to check blood sugar daily. E11.65 02/11/19   Charlott Rakes, MD  Calcium 500-125 MG-UNIT TABS Take 1 tablet by mouth daily. Patient not taking: Reported on 12/31/2013 11/19/12 05/30/14  Tresa Garter, MD      Allergies    Patient has no known allergies.    Review of Systems   Review of Systems  All other systems reviewed and are negative.   Physical Exam Updated Vital Signs BP (!) 146/59 (BP Location: Right Arm)   Pulse 77   Temp 98 F (36.7 C) (Oral)   Resp 16   Ht 1.524 m (5')   Wt 68.9 kg   SpO2 100%   BMI 29.69 kg/m  Physical Exam Vitals and nursing note reviewed.  Constitutional:      General: She is not in acute distress.    Appearance: She is well-developed.  HENT:     Head: Normocephalic and atraumatic.     Mouth/Throat:     Pharynx: No oropharyngeal exudate.  Eyes:     General: No scleral icterus.       Right eye: No discharge.        Left eye: No discharge.     Conjunctiva/sclera: Conjunctivae normal.     Pupils: Pupils are equal, round, and reactive to light.  Neck:     Thyroid: No thyromegaly.     Vascular: No JVD.  Cardiovascular:     Rate and Rhythm: Normal rate and regular rhythm.     Heart sounds: Normal heart sounds. No murmur heard.    No friction rub. No gallop.  Pulmonary:     Effort: Pulmonary effort is normal. No respiratory distress.      Breath sounds: Normal breath sounds. No wheezing or rales.  Abdominal:     General: Bowel sounds are normal. There is no distension.     Palpations: Abdomen is soft. There is no mass.     Tenderness: There is no abdominal tenderness.  Musculoskeletal:        General: No tenderness. Normal range of motion.     Cervical back: Normal range of motion and neck supple.     Right lower leg: No edema.     Left lower leg: No  edema.  Lymphadenopathy:     Cervical: No cervical adenopathy.  Skin:    General: Skin is warm and dry.     Findings: No erythema or rash.  Neurological:     Mental Status: She is alert.     Coordination: Coordination normal.     Comments: Speech is clear, cranial nerves III through XII are intact, memory is intact, strength is normal in all 4 extremities including grips, sensation is intact to light touch and pinprick in all 4 extremities. Coordination as tested by finger-nose-finger is normal, no limb ataxia. Normal gait, normal reflexes at the patellar tendons bilaterally  Psychiatric:        Behavior: Behavior normal.     ED Results / Procedures / Treatments   Labs (all labs ordered are listed, but only abnormal results are displayed) Labs Reviewed  BASIC METABOLIC PANEL - Abnormal; Notable for the following components:      Result Value   Glucose, Bld 208 (*)    All other components within normal limits  URINALYSIS, ROUTINE W REFLEX MICROSCOPIC - Abnormal; Notable for the following components:   Leukocytes,Ua MODERATE (*)    All other components within normal limits  CBC  CBG MONITORING, ED    EKG None  Radiology CT Head Wo Contrast  Result Date: 09/11/2021 CLINICAL DATA:  Head trauma, moderate-severe; Neck trauma, dangerous injury mechanism (Age 15-64y) EXAM: CT HEAD WITHOUT CONTRAST CT CERVICAL SPINE WITHOUT CONTRAST TECHNIQUE: Multidetector CT imaging of the head and cervical spine was performed following the standard protocol without intravenous  contrast. Multiplanar CT image reconstructions of the cervical spine were also generated. RADIATION DOSE REDUCTION: This exam was performed according to the departmental dose-optimization program which includes automated exposure control, adjustment of the mA and/or kV according to patient size and/or use of iterative reconstruction technique. COMPARISON:  None Available. FINDINGS: CT HEAD FINDINGS Brain: Nonspecific basal ganglia mineralization. No evidence of large-territorial acute infarction. No parenchymal hemorrhage. No mass lesion. No extra-axial collection. No mass effect or midline shift. No hydrocephalus. Basilar cisterns are patent. Vascular: No hyperdense vessel.  Hyperostosis frontalis interna. Skull: No acute fracture or focal lesion. Sinuses/Orbits: Paranasal sinuses and mastoid air cells are clear. The orbits are unremarkable. Other: None. CT CERVICAL SPINE FINDINGS Alignment: Grade 1 anterolisthesis of C7 on T1. Skull base and vertebrae: Multilevel mild degenerative changes spine. No severe osseous central canal or neural foraminal stenosis no acute fracture. No aggressive appearing focal osseous lesion or focal pathologic process. Soft tissues and spinal canal: No prevertebral fluid or swelling. No visible canal hematoma. Upper chest: Unremarkable. Other: None. IMPRESSION: 1. No acute intracranial abnormality. 2. No acute displaced fracture or traumatic listhesis of the cervical spine. Electronically Signed   By: Iven Finn M.D.   On: 09/11/2021 17:15   CT Cervical Spine Wo Contrast  Result Date: 09/11/2021 CLINICAL DATA:  Head trauma, moderate-severe; Neck trauma, dangerous injury mechanism (Age 45-64y) EXAM: CT HEAD WITHOUT CONTRAST CT CERVICAL SPINE WITHOUT CONTRAST TECHNIQUE: Multidetector CT imaging of the head and cervical spine was performed following the standard protocol without intravenous contrast. Multiplanar CT image reconstructions of the cervical spine were also generated.  RADIATION DOSE REDUCTION: This exam was performed according to the departmental dose-optimization program which includes automated exposure control, adjustment of the mA and/or kV according to patient size and/or use of iterative reconstruction technique. COMPARISON:  None Available. FINDINGS: CT HEAD FINDINGS Brain: Nonspecific basal ganglia mineralization. No evidence of large-territorial acute infarction. No parenchymal hemorrhage. No  mass lesion. No extra-axial collection. No mass effect or midline shift. No hydrocephalus. Basilar cisterns are patent. Vascular: No hyperdense vessel.  Hyperostosis frontalis interna. Skull: No acute fracture or focal lesion. Sinuses/Orbits: Paranasal sinuses and mastoid air cells are clear. The orbits are unremarkable. Other: None. CT CERVICAL SPINE FINDINGS Alignment: Grade 1 anterolisthesis of C7 on T1. Skull base and vertebrae: Multilevel mild degenerative changes spine. No severe osseous central canal or neural foraminal stenosis no acute fracture. No aggressive appearing focal osseous lesion or focal pathologic process. Soft tissues and spinal canal: No prevertebral fluid or swelling. No visible canal hematoma. Upper chest: Unremarkable. Other: None. IMPRESSION: 1. No acute intracranial abnormality. 2. No acute displaced fracture or traumatic listhesis of the cervical spine. Electronically Signed   By: Iven Finn M.D.   On: 09/11/2021 17:15    Procedures Procedures    Medications Ordered in ED Medications - No data to display  ED Course/ Medical Decision Making/ A&P                           Medical Decision Making Risk Prescription drug management.   Interpreter used for the evaluation  This patient appears well, overall CT scans do not show any signs of acute abnormalities of the brain, I suspect that she has had a combination of side effects of the doxycycline and the minor head injury.  She has a totally normal exam and unremarkable vital signs and  I do not think that this is related to TIA or other acute abnormalities.  The patient is agreeable to take medications for this such as an anti-inflammatory for the headache and meclizine for the vertigo, I have given him reassurance that this should gradually improve over time and they can follow-up with her primary and they are totally agreeable to the plan.  Patient is stable for discharge        Final Clinical Impression(s) / ED Diagnoses Final diagnoses:  Concussion with loss of consciousness, initial encounter    Rx / DC Orders ED Discharge Orders          Ordered    meloxicam (MOBIC) 15 MG tablet  Daily PRN        09/11/21 2058    meclizine (ANTIVERT) 25 MG tablet  3 times daily PRN        09/11/21 2058              Noemi Chapel, MD 09/11/21 2102

## 2021-09-11 NOTE — ED Triage Notes (Addendum)
Family reports patient fell 1.5 weeks ago and hit head but refused to be seen.  But for 4 days now patient has had headache, head pressure and dizziness and sent by UC for CT  Patient has also been having nausea and blurry vision.  Denies headache at ths time

## 2021-09-11 NOTE — ED Triage Notes (Signed)
Patient fell a week ago. Patient hit her forehead and nose. Was bruised but has gone away. No loss of consciousness.  States today she is dizzy, shaking, and pressure in the head. Patient also having tingling in her lips. Patient states the room is spinning off and on, no blurred vision.  One month ago Patient was also treated for a tick bite.

## 2021-09-11 NOTE — Discharge Instructions (Signed)
Your testing today was reassuring and showed no signs of tumors, no signs of stroke, no signs of anything else that is concerning.    You have probably had a concussion, please read the attached instructions regarding concussions.  You may take meloxicam 1 tablet a day for headache, this is a very safe medication to take  You may take meclizine, 1 tablet every 8 hours as needed for dizziness, do not take this medication if you are taking care of young children as it can make you sleepy  Thank you for allowing [place] to treat you in the emergency department today.  After reviewing your examination and potential testing that was done it appears that you are safe to go home.  I would like for you to follow-up with your doctor within the next several days, have them obtain your results and follow-up with them to review all of these tests.  If you should develop severe or worsening symptoms return to the emergency department immediately

## 2021-09-11 NOTE — ED Notes (Signed)
Patient is being discharged from the Urgent Care and sent to the Emergency Department via personal vehicle. Per Dr Eustace Moore, patient is in need of higher level of care due to head injury and dizziness. Patient is aware and verbalizes understanding of plan of care.  Vitals:   09/11/21 1252  BP: (!) 144/71  Pulse: 84  Resp: 16  Temp: 98.3 F (36.8 C)  SpO2: 97%

## 2021-09-12 ENCOUNTER — Other Ambulatory Visit: Payer: Self-pay

## 2021-09-25 ENCOUNTER — Other Ambulatory Visit: Payer: Self-pay

## 2021-09-25 ENCOUNTER — Other Ambulatory Visit: Payer: Self-pay | Admitting: Family Medicine

## 2021-09-25 DIAGNOSIS — E1149 Type 2 diabetes mellitus with other diabetic neurological complication: Secondary | ICD-10-CM

## 2021-09-25 MED ORDER — PIOGLITAZONE HCL 45 MG PO TABS
45.0000 mg | ORAL_TABLET | Freq: Every day | ORAL | 1 refills | Status: DC
  Filled 2021-09-25: qty 30, 30d supply, fill #0
  Filled 2021-11-01: qty 30, 30d supply, fill #1

## 2021-09-26 ENCOUNTER — Other Ambulatory Visit: Payer: Self-pay

## 2021-11-01 ENCOUNTER — Other Ambulatory Visit: Payer: Self-pay | Admitting: Family Medicine

## 2021-11-01 ENCOUNTER — Other Ambulatory Visit: Payer: Self-pay

## 2021-11-01 DIAGNOSIS — E039 Hypothyroidism, unspecified: Secondary | ICD-10-CM

## 2021-11-01 DIAGNOSIS — I10 Essential (primary) hypertension: Secondary | ICD-10-CM

## 2021-11-01 DIAGNOSIS — E1149 Type 2 diabetes mellitus with other diabetic neurological complication: Secondary | ICD-10-CM

## 2021-11-01 MED ORDER — LISINOPRIL-HYDROCHLOROTHIAZIDE 20-25 MG PO TABS
1.0000 | ORAL_TABLET | Freq: Every day | ORAL | 1 refills | Status: DC
  Filled 2021-11-01 – 2022-01-15 (×2): qty 90, 90d supply, fill #0

## 2021-11-01 MED ORDER — LEVOTHYROXINE SODIUM 125 MCG PO TABS
125.0000 ug | ORAL_TABLET | Freq: Every day | ORAL | 0 refills | Status: DC
  Filled 2021-11-01: qty 90, 90d supply, fill #0

## 2021-11-01 MED ORDER — METFORMIN HCL 1000 MG PO TABS
1000.0000 mg | ORAL_TABLET | Freq: Two times a day (BID) | ORAL | 0 refills | Status: DC
  Filled 2021-11-01: qty 180, 90d supply, fill #0

## 2021-11-01 NOTE — Telephone Encounter (Signed)
Requested Prescriptions  Pending Prescriptions Disp Refills  . metFORMIN (GLUCOPHAGE) 1000 MG tablet 180 tablet 0    Sig: Take 1 tablet (1,000 mg total) by mouth 2 (two) times daily with a meal.     Endocrinology:  Diabetes - Biguanides Failed - 11/01/2021 10:20 AM      Failed - B12 Level in normal range and within 720 days    Vitamin B-12  Date Value Ref Range Status  04/05/2009 253 211 - 911 pg/mL Final         Failed - CBC within normal limits and completed in the last 12 months    WBC  Date Value Ref Range Status  09/11/2021 8.8 4.0 - 10.5 K/uL Final   RBC  Date Value Ref Range Status  09/11/2021 4.80 3.87 - 5.11 MIL/uL Final   Hemoglobin  Date Value Ref Range Status  09/11/2021 12.7 12.0 - 15.0 g/dL Final  08/27/2016 12.2 11.1 - 15.9 g/dL Final   HCT  Date Value Ref Range Status  09/11/2021 39.2 36.0 - 46.0 % Final   Hematocrit  Date Value Ref Range Status  08/27/2016 38.2 34.0 - 46.6 % Final   MCHC  Date Value Ref Range Status  09/11/2021 32.4 30.0 - 36.0 g/dL Final   [facility]  Date Value Ref Range Status  09/11/2021 26.5 26.0 - 34.0 pg Final   MCV  Date Value Ref Range Status  09/11/2021 81.7 80.0 - 100.0 fL Final  08/27/2016 79 79 - 97 fL Final   No results found for: "PLTCOUNTKUC", "LABPLAT", "POCPLA" RDW  Date Value Ref Range Status  09/11/2021 14.4 11.5 - 15.5 % Final  08/27/2016 15.2 12.3 - 15.4 % Final         Passed - Cr in normal range and within 360 days    Creat  Date Value Ref Range Status  02/19/2016 0.74 0.50 - 0.99 mg/dL Final    Comment:      For patients > or = 75 years of age: The upper reference limit for Creatinine is approximately 13% higher for people identified as African-American.      Creatinine, Ser  Date Value Ref Range Status  09/11/2021 0.89 0.44 - 1.00 mg/dL Final   Creatinine, Urine  Date Value Ref Range Status  02/19/2016 296 20 - 320 mg/dL Final         Passed - HBA1C is between 0 and 7.9 and within 180  days    HbA1c, POC (controlled diabetic range)  Date Value Ref Range Status  05/22/2021 7.3 (A) 0.0 - 7.0 % Final         Passed - eGFR in normal range and within 360 days    GFR, Est African American  Date Value Ref Range Status  02/19/2016 >89 >=60 mL/min Final   GFR calc Af Amer  Date Value Ref Range Status  10/26/2019 87 >59 mL/min/1.73 Final    Comment:    **[facility] currently reports eGFR in compliance with the current**   recommendations of the [employer]. [facility] will   update reporting as new guidelines are published from the NKF-ASN   Task force.    GFR, Est Non African American  Date Value Ref Range Status  02/19/2016 83 >=60 mL/min Final   GFR, Estimated  Date Value Ref Range Status  09/11/2021 >60 >60 mL/min Final    Comment:    (NOTE) Calculated using the CKD-EPI Creatinine Equation (2021)    eGFR  Date Value Ref Range  Status  05/22/2021 77 >59 mL/min/1.73 Final         Passed - Valid encounter within last 6 months    Recent Outpatient Visits          5 months ago Type 2 diabetes mellitus with hyperglycemia, with long-term current use of insulin ([place])   [place], Charlane Ferretti, MD   11 months ago Annual physical exam   [place], [place], MD   1 year ago Type 2 diabetes mellitus with hyperglycemia, with long-term current use of insulin ([place])   [place], Charlane Ferretti, MD   2 years ago Type 2 diabetes mellitus with hyperglycemia, with long-term current use of insulin ([place])   [place], Charlane Ferretti, MD   2 years ago Type 2 diabetes mellitus with hyperglycemia, with long-term current use of insulin ([place])   [place], Enobong, MD      Future Appointments            In 4 weeks Charlott Rakes, MD [place]           .  lisinopril-hydrochlorothiazide (ZESTORETIC) 20-25 MG tablet 90 tablet 1    Sig: TAKE 1 TABLET BY MOUTH DAILY.     Cardiovascular:  ACEI + Diuretic Combos Passed - 11/01/2021 10:20 AM      Passed - Na in normal range and within 180 days    Sodium  Date Value Ref Range Status  09/11/2021 140 135 - 145 mmol/L Final  05/22/2021 138 134 - 144 mmol/L Final         Passed - K in normal range and within 180 days    Potassium  Date Value Ref Range Status  09/11/2021 4.3 3.5 - 5.1 mmol/L Final         Passed - Cr in normal range and within 180 days    Creat  Date Value Ref Range Status  02/19/2016 0.74 0.50 - 0.99 mg/dL Final    Comment:      For patients > or = 75 years of age: The upper reference limit for Creatinine is approximately 13% higher for people identified as African-American.      Creatinine, Ser  Date Value Ref Range Status  09/11/2021 0.89 0.44 - 1.00 mg/dL Final   Creatinine, Urine  Date Value Ref Range Status  02/19/2016 296 20 - 320 mg/dL Final         Passed - eGFR is 30 or above and within 180 days    GFR, Est African American  Date Value Ref Range Status  02/19/2016 >89 >=60 mL/min Final   GFR calc Af Amer  Date Value Ref Range Status  10/26/2019 87 >59 mL/min/1.73 Final    Comment:    **[facility] currently reports eGFR in compliance with the current**   recommendations of the [employer]. [facility] will   update reporting as new guidelines are published from the NKF-ASN   Task force.    GFR, Est Non African American  Date Value Ref Range Status  02/19/2016 83 >=60 mL/min Final   GFR, Estimated  Date Value Ref Range Status  09/11/2021 >60 >60 mL/min Final    Comment:    (NOTE) Calculated using the CKD-EPI Creatinine Equation (2021)    eGFR  Date Value Ref Range Status  05/22/2021 77 >59 mL/min/1.73 Final  Passed - Patient is not pregnant      Passed - Last BP in normal range    BP Readings from Last 1 Encounters:   09/11/21 135/70         Passed - Valid encounter within last 6 months    Recent Outpatient Visits          5 months ago Type 2 diabetes mellitus with hyperglycemia, with long-term current use of insulin ([place])   [place], Charlane Ferretti, MD   11 months ago Annual physical exam   [place], [place], MD   1 year ago Type 2 diabetes mellitus with hyperglycemia, with long-term current use of insulin ([place])   [place], Charlane Ferretti, MD   2 years ago Type 2 diabetes mellitus with hyperglycemia, with long-term current use of insulin ([place])   [place], Charlane Ferretti, MD   2 years ago Type 2 diabetes mellitus with hyperglycemia, with long-term current use of insulin ([place])   [place], Enobong, MD      Future Appointments            In 4 weeks Charlott Rakes, MD [place]           . levothyroxine (SYNTHROID) 125 MCG tablet 90 tablet 0    Sig: Take 1 tablet (125 mcg total) by mouth daily before breakfast.     Endocrinology:  Hypothyroid Agents Passed - 11/01/2021 10:20 AM      Passed - TSH in normal range and within 360 days    TSH  Date Value Ref Range Status  05/22/2021 2.840 0.450 - 4.500 uIU/mL Final         Passed - Valid encounter within last 12 months    Recent Outpatient Visits          5 months ago Type 2 diabetes mellitus with hyperglycemia, with long-term current use of insulin ([place])   [place], Charlane Ferretti, MD   11 months ago Annual physical exam   [place], [place], MD   1 year ago Type 2 diabetes mellitus with hyperglycemia, with long-term current use of insulin ([place])   [place], Charlane Ferretti, MD   2 years ago Type 2 diabetes mellitus with  hyperglycemia, with long-term current use of insulin ([place])   [place], Charlane Ferretti, MD   2 years ago Type 2 diabetes mellitus with hyperglycemia, with long-term current use of insulin ([place])   [place], Enobong, MD      Future Appointments            In 4 weeks Charlott Rakes, MD [place]

## 2021-11-09 ENCOUNTER — Other Ambulatory Visit: Payer: Self-pay

## 2021-11-15 ENCOUNTER — Other Ambulatory Visit: Payer: Self-pay | Admitting: Family Medicine

## 2021-11-15 ENCOUNTER — Other Ambulatory Visit: Payer: Self-pay

## 2021-11-15 DIAGNOSIS — E1149 Type 2 diabetes mellitus with other diabetic neurological complication: Secondary | ICD-10-CM

## 2021-11-15 MED ORDER — PIOGLITAZONE HCL 45 MG PO TABS
45.0000 mg | ORAL_TABLET | Freq: Every day | ORAL | 0 refills | Status: DC
  Filled 2021-11-15 – 2021-11-29 (×2): qty 30, 30d supply, fill #0

## 2021-11-29 ENCOUNTER — Encounter: Payer: Self-pay | Admitting: Family Medicine

## 2021-11-29 ENCOUNTER — Ambulatory Visit: Payer: Self-pay | Attending: Family Medicine | Admitting: Family Medicine

## 2021-11-29 ENCOUNTER — Other Ambulatory Visit: Payer: Self-pay

## 2021-11-29 ENCOUNTER — Other Ambulatory Visit: Payer: Self-pay | Admitting: Pharmacist

## 2021-11-29 VITALS — BP 151/77 | HR 73 | Temp 98.1°F | Ht 60.0 in | Wt 165.2 lb

## 2021-11-29 DIAGNOSIS — I10 Essential (primary) hypertension: Secondary | ICD-10-CM

## 2021-11-29 DIAGNOSIS — E1149 Type 2 diabetes mellitus with other diabetic neurological complication: Secondary | ICD-10-CM

## 2021-11-29 DIAGNOSIS — E1169 Type 2 diabetes mellitus with other specified complication: Secondary | ICD-10-CM

## 2021-11-29 DIAGNOSIS — Z794 Long term (current) use of insulin: Secondary | ICD-10-CM

## 2021-11-29 DIAGNOSIS — M170 Bilateral primary osteoarthritis of knee: Secondary | ICD-10-CM

## 2021-11-29 DIAGNOSIS — E785 Hyperlipidemia, unspecified: Secondary | ICD-10-CM

## 2021-11-29 DIAGNOSIS — Z23 Encounter for immunization: Secondary | ICD-10-CM

## 2021-11-29 DIAGNOSIS — E1165 Type 2 diabetes mellitus with hyperglycemia: Secondary | ICD-10-CM

## 2021-11-29 LAB — POCT GLYCOSYLATED HEMOGLOBIN (HGB A1C): HbA1c, POC (controlled diabetic range): 8.5 % — AB (ref 0.0–7.0)

## 2021-11-29 MED ORDER — DICLOFENAC SODIUM 1 % EX GEL
4.0000 g | Freq: Four times a day (QID) | CUTANEOUS | 1 refills | Status: DC
  Filled 2021-11-29: qty 200, 13d supply, fill #0

## 2021-11-29 MED ORDER — METFORMIN HCL 1000 MG PO TABS
1000.0000 mg | ORAL_TABLET | Freq: Two times a day (BID) | ORAL | 1 refills | Status: DC
  Filled 2021-11-29: qty 180, 90d supply, fill #0

## 2021-11-29 MED ORDER — DAPAGLIFLOZIN PROPANEDIOL 10 MG PO TABS
10.0000 mg | ORAL_TABLET | Freq: Every day | ORAL | 1 refills | Status: DC
  Filled 2021-11-29: qty 90, 90d supply, fill #0

## 2021-11-29 MED ORDER — DAPAGLIFLOZIN PROPANEDIOL 5 MG PO TABS
10.0000 mg | ORAL_TABLET | Freq: Every day | ORAL | 2 refills | Status: DC
  Filled 2021-11-29 – 2021-12-28 (×2): qty 60, 30d supply, fill #0

## 2021-11-29 NOTE — Progress Notes (Signed)
Subjective:  Patient ID: Linda House, female    DOB: 1945/06/09  Age: 76 y.o. MRN: [id number]  CC: Diabetes   HPI Linda House is a 76 y.o. year old female with a history of  type 2 diabetes mellitus (A1c 8.5), hypothyroidism, hyperlipidemia, hypertension.   Interval History:  BP is elevated and she is yet to take her antihypertensive as she is fasting and activation of blood work today.  She Complains of ongoing knee pain and swelling which resolves with the use of medications.  Symptoms are more in her right knee but absent at the moment.  She has been out of her Actos for 15 days and her A1c is 8.5 up from 7.2. Endorses adherence with her medications.  Her gabapentin has been beneficial with regards to her neuropathy.  Denies visual concerns or hypoglycemic symptoms.  She tries to walk once in a while but is not totally adherent with a diabetic diet and endorses ingesting diet high in starch. Past Medical History:  Diagnosis Date   Diabetes mellitus    Diabetes mellitus without complication ([place])    Hypertension     History reviewed. No pertinent surgical history.  Family History  Problem Relation Age of Onset   Diabetes Father    Hypertension Father     Social History   Socioeconomic History   Marital status: Married    Spouse name: Not on file   Number of children: Not on file   Years of education: Not on file   Highest education level: Not on file  Occupational History   Not on file  Tobacco Use   Smoking status: Never   Smokeless tobacco: Never  Vaping Use   Vaping Use: Never used  Substance and Sexual Activity   Alcohol use: No   Drug use: No   Sexual activity: Never  Other Topics Concern   Not on file  Social History Narrative   ** Merged History Encounter **       Social Determinants of Health   Financial Resource Strain: Not on file  Food Insecurity: Not on file  Transportation Needs: Not on file  Physical  Activity: Not on file  Stress: Not on file  Social Connections: Not on file    No Known Allergies  Outpatient Medications Prior to Visit  Medication Sig Dispense Refill   amLODipine (NORVASC) 10 MG tablet TAKE 1 TABLET (10 MG TOTAL) BY MOUTH DAILY. 90 tablet 1   aspirin EC 81 MG tablet Take 1 tablet (81 mg total) by mouth daily. 30 tablet 3   atorvastatin (LIPITOR) 80 MG tablet TAKE 1 TABLET (80 MG TOTAL) BY MOUTH DAILY. 90 tablet 1   Blood Glucose Monitoring Suppl (TRUE METRIX METER) w/Device KIT Use to check blood sugar daily. E11.65 1 kit 0   cyclobenzaprine (FLEXERIL) 10 MG tablet TAKE 1 TABLET BY MOUTH AT BEDTIME. 30 tablet 0   gabapentin (NEURONTIN) 300 MG capsule Take 1 capsule (300 mg total) by mouth at bedtime. 30 capsule 6   glipiZIDE (GLIPIZIDE XL) 10 MG 24 hr tablet TAKE 2 TABLETS (20 MG TOTAL) BY MOUTH DAILY. 60 tablet 6   glucose blood test strip Use as instructed to check blood sugar daily. E11.65 100 each 11   levothyroxine (SYNTHROID) 125 MCG tablet Take 1 tablet (125 mcg total) by mouth daily before breakfast. 90 tablet 0   lisinopril-hydrochlorothiazide (ZESTORETIC) 20-25 MG tablet TAKE 1 TABLET BY MOUTH DAILY. 90 tablet 1   meclizine (ANTIVERT)  25 MG tablet Take 1 tablet (25 mg total) by mouth 3 (three) times daily as needed for dizziness. 30 tablet 0   olopatadine (PATANOL) 0.1 % ophthalmic solution Place 1 drop into both eyes 2 (two) times daily. 5 mL 1   pioglitazone (ACTOS) 45 MG tablet Take 1 tablet (45 mg total) by mouth daily. 30 tablet 0   spironolactone (ALDACTONE) 25 MG tablet Take 1 tablet (25 mg total) by mouth daily. 30 tablet 3   traZODone (DESYREL) 50 MG tablet Take 0.5-1 tablets (25-50 mg total) by mouth at bedtime as needed for sleep. 30 tablet 3   TRUEplus Lancets 28G MISC Use to check blood sugar daily. E11.65 100 each 11   benzonatate (TESSALON) 100 MG capsule Take 1 capsule (100 mg total) by mouth 2 (two) times daily as needed for cough. 20 capsule 0    dapagliflozin propanediol (FARXIGA) 5 MG TABS tablet TAKE 1 TABLET (5 MG TOTAL) BY MOUTH DAILY BEFORE BREAKFAST. 30 tablet 6   diclofenac Sodium (VOLTAREN) 1 % GEL Apply 4 g topically 4 (four) times daily. 150 g 1   doxycycline (VIBRA-TABS) 100 MG tablet Take 1 tablet (100 mg total) by mouth 2 (two) times daily. 20 tablet 0   metFORMIN (GLUCOPHAGE) 1000 MG tablet Take 1 tablet (1,000 mg total) by mouth 2 (two) times daily with a meal. 180 tablet 0   metFORMIN (GLUCOPHAGE) 1000 MG tablet TAKE 1 TABLET (1,000 MG TOTAL) BY MOUTH 2 (TWO) TIMES DAILY WITH A MEAL. 60 tablet 6   predniSONE (DELTASONE) 20 MG tablet Take 1 tablet (20 mg total) by mouth daily with breakfast. (Patient not taking: Reported on 11/29/2021) 5 tablet 0   No facility-administered medications prior to visit.     ROS Review of Systems  Constitutional:  Negative for activity change and appetite change.  HENT:  Negative for sinus pressure and sore throat.   Respiratory:  Negative for chest tightness, shortness of breath and wheezing.   Cardiovascular:  Negative for chest pain and palpitations.  Gastrointestinal:  Negative for abdominal distention, abdominal pain and constipation.  Genitourinary: Negative.   Musculoskeletal: Negative.   Psychiatric/Behavioral:  Negative for behavioral problems and dysphoric mood.     Objective:  BP (!) 151/77   Pulse 73   Temp 98.1 F (36.7 C) (Oral)   Ht 5' (1.524 m)   Wt 165 lb 3.2 oz (74.9 kg)   SpO2 96%   BMI 32.26 kg/m      11/29/2021    8:40 AM 09/11/2021    9:17 PM 09/11/2021    7:02 PM  BP/Weight  Systolic BP 035 009 381  Diastolic BP 77 70 59  Wt. (Lbs) 165.2    BMI 32.26 kg/m2        Physical Exam Constitutional:      Appearance: She is well-developed.  Cardiovascular:     Rate and Rhythm: Normal rate.     Heart sounds: Normal heart sounds. No murmur heard. Pulmonary:     Effort: Pulmonary effort is normal.     Breath sounds: Normal breath sounds. No  wheezing or rales.  Chest:     Chest wall: No tenderness.  Abdominal:     General: Bowel sounds are normal. There is no distension.     Palpations: Abdomen is soft. There is no mass.     Tenderness: There is no abdominal tenderness.  Musculoskeletal:        General: Normal range of motion.  Right lower leg: No edema.     Left lower leg: No edema.     Comments: Slight edema of medial aspect of right knee with associated crepitus on mild pain on passive range of motion Left knee is normal  Neurological:     Mental Status: She is alert and oriented to person, place, and time.  Psychiatric:        Mood and Affect: Mood normal.    Diabetic Foot Exam - Simple   Simple Foot Form Diabetic Foot exam was performed with the following findings: Yes 11/29/2021  9:07 AM  Visual Inspection No deformities, no ulcerations, no other skin breakdown bilaterally: Yes Sensation Testing Intact to touch and monofilament testing bilaterally: Yes Pulse Check Posterior Tibialis and Dorsalis pulse intact bilaterally: Yes Comments        Latest Ref Rng & Units 09/11/2021    3:53 PM 05/22/2021    9:50 AM 11/27/2020    9:32 AM  CMP  Glucose 70 - 99 mg/dL 208  140  87   BUN 8 - 23 mg/dL _0 Creatinine 0.44 - 1.00 mg/dL 0.89  0.80  0.74   Sodium 135 - 145 mmol/L 140  138  143   Potassium 3.5 - 5.1 mmol/L 4.3  4.7  4.5   Chloride 98 - 111 mmol/L 102  100  102   CO2 22 - 32 mmol/L _1 Calcium 8.9 - 10.3 mg/dL 9.4  9.2  9.2   Total Protein 6.0 - 8.5 g/dL  7.4    Total Bilirubin 0.0 - 1.2 mg/dL  0.4    Alkaline Phos 44 - 121 IU/L  179    AST 0 - 40 IU/L  18    ALT 0 - 32 IU/L  21      Lipid Panel     Component Value Date/Time   CHOL 218 (H) 05/22/2021 0950   TRIG 144 05/22/2021 0950   HDL 56 05/22/2021 0950   CHOLHDL 3.4 05/24/2020 0918   CHOLHDL 3.4 02/19/2016 1021   VLDL 25 08/29/2015 0849   LDLCALC 136 (H) 05/22/2021 0950    CBC    Component Value Date/Time   WBC  8.8 09/11/2021 1553   RBC 4.80 09/11/2021 1553   HGB 12.7 09/11/2021 1553   HGB 12.2 08/27/2016 0919   HCT 39.2 09/11/2021 1553   HCT 38.2 08/27/2016 0919   PLT 326 09/11/2021 1553   PLT 355 08/27/2016 0919   MCV 81.7 09/11/2021 1553   MCV 79 08/27/2016 0919   MCH 26.5 09/11/2021 1553   MCHC 32.4 09/11/2021 1553   RDW 14.4 09/11/2021 1553   RDW 15.2 08/27/2016 0919   LYMPHSABS 3.0 11/02/2020 2109   LYMPHSABS 2.5 08/27/2016 0919   MONOABS 0.5 11/02/2020 2109   EOSABS 0.2 11/02/2020 2109   EOSABS 0.2 08/27/2016 0919   BASOSABS 0.0 11/02/2020 2109   BASOSABS 0.0 08/27/2016 0919    Lab Results  Component Value Date   HGBA1C 8.5 (A) 11/29/2021    Assessment & Plan:  1. Type 2 diabetes mellitus with hyperglycemia, with long-term current use of insulin (HCC) Uncontrolled with A1c of 8.5, goal is less than 7.0. She has been out of Actos for 2 weeks Increase dose of Farxiga Non adherence to diabetic diet also contributing but she promises to do. Counseled on Diabetic diet, my plate method, 370 minutes of moderate intensity exercise/week Blood sugar logs with fasting goals of  80-120 mg/dl, random of less than 180 and in the event of sugars less than 60 mg/dl or greater than 400 mg/dl encouraged to notify the clinic. Advised on the need for annual eye exams, annual foot exams, Pneumonia vaccine. - POCT glycosylated hemoglobin (Hb A1C) - dapagliflozin propanediol (FARXIGA) 10 MG TABS tablet; Take 1 tablet (10 mg total) by mouth daily before breakfast.  Dispense: 90 tablet; Refill: 1 - CMP14+EGFR - LP+Non-HDL Cholesterol  2. Type 2 diabetes mellitus with other neurologic complication, without long-term current use of insulin (HCC) Neuropathy is controlled on gabapentin - metFORMIN (GLUCOPHAGE) 1000 MG tablet; Take 1 tablet (1,000 mg total) by mouth 2 (two) times daily with a meal.  Dispense: 180 tablet; Refill: 1  3. Hyperlipidemia associated with type 2 diabetes mellitus  ([place]) Uncontrolled We will send of lipid panel and adjust statin if still above goal Low-cholesterol diet  4. Primary osteoarthritis of both knees Stable with intermittent flares Continue Voltaren gel, use knee brace - diclofenac Sodium (VOLTAREN) 1 % GEL; Apply 4 g topically 4 (four) times daily.  Dispense: 150 g; Refill: 1  5. Essential hypertension, benign Uncontrolled due to the fact that she is yet to take her antihypertensive Advised to take her antihypertensive prior to her office visit in the future Counseled on blood pressure goal of less than 130/80, low-sodium, DASH diet, medication compliance, 150 minutes of moderate intensity exercise per week. Discussed medication compliance, adverse effects.    Meds ordered this encounter  Medications   dapagliflozin propanediol (FARXIGA) 10 MG TABS tablet    Sig: Take 1 tablet (10 mg total) by mouth daily before breakfast.    Dispense:  90 tablet    Refill:  1    Dose increase   diclofenac Sodium (VOLTAREN) 1 % GEL    Sig: Apply 4 g topically 4 (four) times daily.    Dispense:  150 g    Refill:  1   metFORMIN (GLUCOPHAGE) 1000 MG tablet    Sig: Take 1 tablet (1,000 mg total) by mouth 2 (two) times daily with a meal.    Dispense:  180 tablet    Refill:  1    Follow-up: Return in about 3 months (around 03/01/2022) for Chronic medical conditions.       Charlott Rakes, MD, FAAFP. [facility] [place], [place]   11/29/2021, 9:22 AM

## 2021-11-29 NOTE — Patient Instructions (Signed)
Artrosis Osteoarthritis  La artrosis es un tipo de artritis. Esta afeccin produce dolor o enfermedad en las articulaciones. La artrosis afecta al tejido que cubre los extremos de los huesos en las articulaciones (cartlago). El cartlago acta como amortiguador entre los huesos y los ayuda a moverse con suavidad. La artrosis se presenta cuando el cartlago de las articulaciones se gasta. A veces, la artrosis se denomina artritis "por uso y desgaste". La artrosis es la forma ms frecuente de artritis. A menudo, afecta a las personas mayores. Es una afeccin que empeora con el tiempo. Las articulaciones afectadas con mayor frecuencia por esta afeccin se encuentran en los dedos de las manos, los dedos de los pies, las caderas, las rodillas y la columna vertebral, incluyendo el cuello y la parte inferior de la espalda. Cules son las causas? Esta afeccin es causada por el desgaste del cartlago que cubre los extremos de los huesos. Qu incrementa el riesgo? Los siguientes factores pueden hacer que sea ms propenso a desarrollar esta afeccin: Ser mayor de 50 aos. Obesidad. Uso excesivo de la articulacin. Lesin pasada de una articulacin. Ciruga pasada en una articulacin. Antecedentes familiares de artrosis. Cules son los signos o sntomas? Los principales sntomas de esta enfermedad son dolor, hinchazn y rigidez en la articulacin. Otros sntomas pueden incluir: Agrandamiento de la articulacin. Aumento del dolor y dao adicional causado por pequeos trozos de hueso o cartlago que se desprenden y flotan dentro de la articulacin. Formacin de pequeos depsitos de hueso (osteofitos) en los extremos de la articulacin. Una sensacin de chirrido o raspado dentro de la articulacin al moverla. Sonidos de chasquido o crujido al moverse. Dificultad para caminar o hacer ejercicio. Incapacidad para agarrar objetos, girar las manos o controlar los movimientos de las manos y los dedos. Cmo  se diagnostica? Esta afeccin se puede diagnosticar en funcin de lo siguiente: Sus antecedentes mdicos. Un examen fsico. Sus sntomas. Radiografas de la(s) articulacin(es) afectada(s). Anlisis de sangre para descartar otros tipos de artritis. Cmo se trata? No hay cura para esta enfermedad, pero el tratamiento puede ayudar a controlar el dolor y mejorar el funcionamiento de la articulacin. El tratamiento puede incluir una combinacin de terapias, como las siguientes: Tcnicas de alivio del dolor, como: Aplicacin de calor y fro en la articulacin. Masajes. Una forma de psicoterapia llamada terapia cognitivo conductual (TCC). Esta terapia le ayuda a establecer objetivos y a realizar un seguimiento de los cambios que hace. Analgsicos y antiinflamatorios. Los medicamentos pueden tomarse por boca o aplicarse en la piel. Incluyen los siguientes: Antiinflamatorios no esteroideos (AINE), como el ibuprofeno. Medicamentos recetados. Antiinflamatorios fuertes (corticoesteroides). Ciertos suplementos nutricionales. Un programa de ejercicios recomendado. Puede trabajar con un fisioterapeuta. Dispositivos de ayuda, como un dispositivo ortopdico, una frula, un guante especial o un bastn. Un plan de control del peso. Ciruga, como: Una osteotoma. Se hace para volver a posicionar los huesos y aliviar el dolor o para retirar los trozos sueltos de hueso y cartlago. Ciruga de reemplazo articular. Es posible que necesite esta ciruga si tiene una artrosis muy avanzada. Siga estas instrucciones en su casa: Actividad Descanse las articulaciones afectadas como se lo haya indicado el mdico. Haga actividad fsica como se lo haya indicado el mdico. Este puede recomendarle tipos especficos de ejercicios, por ejemplo: Ejercicios de fortalecimiento. Se realizan para fortalecer los msculos que sostienen las articulaciones afectadas por la artritis. Ejercicios aerbicos. Son ejercicios, como  caminar a paso ligero o hacer gimnasia aerbica acutica, que aumentan la frecuencia cardaca. Actividades de amplitud   de movimientos. Estos ayudan a que las articulaciones se muevan con ms facilidad. Ejercicios de equilibrio y agilidad. Control del dolor, la rigidez y la hinchazn     Si se lo indican, aplique calor en la zona afectada con la frecuencia que le haya indicado el mdico. Use la fuente de calor que el mdico le recomiende, como una compresa de calor hmedo o una almohadilla trmica. Si tiene un dispositivo de ayuda que se puede quitar, quteselo segn lo indicado por su mdico. Colquese una toalla entre la piel y la fuente de calor. Si el mdico le indica que no se quite el dispositivo de ayuda mientras se aplica calor, coloque una toalla entre el dispositivo de ayuda y la fuente de calor. Aplique calor durante 20 a 30 minutos. Retire la fuente de calor si la piel se pone de color rojo brillante. Esto es especialmente importante si no puede sentir dolor, calor o fro. Puede correr un riesgo mayor de sufrir quemaduras. Si se lo indican, aplique hielo sobre la zona afectada. Para hacer esto: Si tiene un dispositivo de ayuda que se puede quitar, quteselo segn lo indicado por su mdico. Ponga el hielo en una bolsa plstica. Coloque una toalla entre la piel y la bolsa. Si el mdico le indica que no se quite el dispositivo de ayuda mientras se aplica hielo, coloque una toalla entre el dispositivo de ayuda y la bolsa de hielo. Aplique el hielo durante 20 minutos, 2 o 3 veces por da. Mueva los dedos de las manos o de los pies con frecuencia para reducir la rigidez y la hinchazn. Cuando est sentado o acostado, levante (eleve) la zona lesionada por encima del nivel del corazn. Instrucciones generales Use los medicamentos de venta libre y los recetados solamente como se lo haya indicado el mdico. Mantenga un peso saludable. Siga las instrucciones de su mdico con respecto al control  de su peso. No consuma ningn producto que contenga nicotina o tabaco, como cigarrillos, cigarrillos electrnicos y tabaco de mascar. Si necesita ayuda para dejar de fumar, consulte al mdico. Use los dispositivos de ayuda como se lo haya indicado el mdico. Concurra a todas las visitas de seguimiento como se lo haya indicado el mdico. Esto es importante. Dnde buscar ms informacin National Institute of Arthritis and Musculoskeletal and Skin Diseases (Instituto Nacional de Artritis y Enfermedades Musculoesquelticas y Dermatolgicas): www.niams.nih.gov National Institute on Aging (Instituto Nacional sobre el Envejecimiento): www.nia.nih.gov American College of Rheumatology (Instituto Norteamericano de Reumatologa): www.rheumatology.org Comunquese con un mdico si: Tiene enrojecimiento, hinchazn o sensacin de calor que empeora en una articulacin. Tiene fiebre y siente dolor en la articulacin o el msculo. Presenta una erupcin cutnea. Tiene dificultad para realizar las actividades cotidianas. Solicite ayuda de inmediato si: Siente dolor que empeora y no se alivia con los analgsicos. Resumen La artrosis es una clase de artritis que afecta el tejido que cubre los extremos de los huesos en las articulaciones (cartlago). Esta afeccin es causada por el desgaste del cartlago que cubre los extremos de los huesos. Los sntomas principales de esta enfermedad son dolor, hinchazn y rigidez en la articulacin. No hay cura para esta enfermedad, pero el tratamiento puede ayudar a controlar el dolor y mejorar el funcionamiento de la articulacin. Esta informacin no tiene como fin reemplazar el consejo del mdico. Asegrese de hacerle al mdico cualquier pregunta que tenga. Document Revised: 02/08/2019 Document Reviewed: 02/08/2019 Elsevier Patient Education  2023 Elsevier Inc.  

## 2021-11-30 ENCOUNTER — Other Ambulatory Visit: Payer: Self-pay | Admitting: Family Medicine

## 2021-11-30 ENCOUNTER — Other Ambulatory Visit: Payer: Self-pay

## 2021-11-30 LAB — CMP14+EGFR
ALT: 14 IU/L (ref 0–32)
AST: 17 IU/L (ref 0–40)
Albumin/Globulin Ratio: 1.5 (ref 1.2–2.2)
Albumin: 4 g/dL (ref 3.8–4.8)
Alkaline Phosphatase: 143 IU/L — ABNORMAL HIGH (ref 44–121)
BUN/Creatinine Ratio: 10 — ABNORMAL LOW (ref 12–28)
BUN: 7 mg/dL — ABNORMAL LOW (ref 8–27)
Bilirubin Total: 0.2 mg/dL (ref 0.0–1.2)
CO2: 25 mmol/L (ref 20–29)
Calcium: 9 mg/dL (ref 8.7–10.3)
Chloride: 102 mmol/L (ref 96–106)
Creatinine, Ser: 0.67 mg/dL (ref 0.57–1.00)
Globulin, Total: 2.7 g/dL (ref 1.5–4.5)
Glucose: 94 mg/dL (ref 70–99)
Potassium: 4.7 mmol/L (ref 3.5–5.2)
Sodium: 139 mmol/L (ref 134–144)
Total Protein: 6.7 g/dL (ref 6.0–8.5)
eGFR: 91 mL/min/{1.73_m2} (ref >59)

## 2021-11-30 LAB — LP+NON-HDL CHOLESTEROL
Cholesterol, Total: 274 mg/dL — ABNORMAL HIGH (ref 100–199)
HDL: 54 mg/dL (ref >39)
LDL Chol Calc (NIH): 182 mg/dL — ABNORMAL HIGH (ref 0–99)
Total Non-HDL-Chol (LDL+VLDL): 220 mg/dL — ABNORMAL HIGH (ref 0–129)
Triglycerides: 203 mg/dL — ABNORMAL HIGH (ref 0–149)
VLDL Cholesterol Cal: 38 mg/dL (ref 5–40)

## 2021-11-30 MED ORDER — ROSUVASTATIN CALCIUM 20 MG PO TABS
20.0000 mg | ORAL_TABLET | Freq: Every day | ORAL | 1 refills | Status: DC
  Filled 2021-11-30: qty 90, 90d supply, fill #0

## 2021-12-07 ENCOUNTER — Other Ambulatory Visit: Payer: Self-pay

## 2021-12-14 ENCOUNTER — Other Ambulatory Visit: Payer: Self-pay

## 2021-12-25 ENCOUNTER — Other Ambulatory Visit: Payer: Self-pay

## 2021-12-28 ENCOUNTER — Other Ambulatory Visit: Payer: Self-pay

## 2022-01-03 ENCOUNTER — Other Ambulatory Visit: Payer: Self-pay

## 2022-01-04 ENCOUNTER — Other Ambulatory Visit: Payer: Self-pay

## 2022-01-11 ENCOUNTER — Other Ambulatory Visit: Payer: Self-pay

## 2022-01-15 ENCOUNTER — Other Ambulatory Visit: Payer: Self-pay

## 2022-01-15 ENCOUNTER — Other Ambulatory Visit: Payer: Self-pay | Admitting: Family Medicine

## 2022-01-15 DIAGNOSIS — E1149 Type 2 diabetes mellitus with other diabetic neurological complication: Secondary | ICD-10-CM

## 2022-01-15 MED ORDER — PIOGLITAZONE HCL 45 MG PO TABS
45.0000 mg | ORAL_TABLET | Freq: Every day | ORAL | 1 refills | Status: DC
  Filled 2022-01-15: qty 30, 30d supply, fill #0

## 2022-01-16 ENCOUNTER — Other Ambulatory Visit: Payer: Self-pay

## 2022-01-21 ENCOUNTER — Other Ambulatory Visit: Payer: Self-pay

## 2022-01-22 ENCOUNTER — Other Ambulatory Visit: Payer: Self-pay

## 2022-01-30 ENCOUNTER — Other Ambulatory Visit: Payer: Self-pay

## 2022-03-01 ENCOUNTER — Other Ambulatory Visit: Payer: Self-pay

## 2022-03-13 ENCOUNTER — Ambulatory Visit: Payer: Self-pay | Admitting: Family Medicine

## 2022-03-13 ENCOUNTER — Ambulatory Visit: Payer: Self-pay | Attending: Family Medicine | Admitting: Physician Assistant

## 2022-03-13 ENCOUNTER — Other Ambulatory Visit: Payer: Self-pay

## 2022-03-13 ENCOUNTER — Encounter: Payer: Self-pay | Admitting: Physician Assistant

## 2022-03-13 VITALS — BP 106/67 | HR 75 | Ht 59.0 in | Wt 163.2 lb

## 2022-03-13 DIAGNOSIS — E1165 Type 2 diabetes mellitus with hyperglycemia: Secondary | ICD-10-CM

## 2022-03-13 DIAGNOSIS — Z789 Other specified health status: Secondary | ICD-10-CM

## 2022-03-13 DIAGNOSIS — E039 Hypothyroidism, unspecified: Secondary | ICD-10-CM

## 2022-03-13 DIAGNOSIS — Z794 Long term (current) use of insulin: Secondary | ICD-10-CM

## 2022-03-13 DIAGNOSIS — R0981 Nasal congestion: Secondary | ICD-10-CM

## 2022-03-13 DIAGNOSIS — G4709 Other insomnia: Secondary | ICD-10-CM

## 2022-03-13 DIAGNOSIS — M1712 Unilateral primary osteoarthritis, left knee: Secondary | ICD-10-CM

## 2022-03-13 DIAGNOSIS — E785 Hyperlipidemia, unspecified: Secondary | ICD-10-CM

## 2022-03-13 DIAGNOSIS — I10 Essential (primary) hypertension: Secondary | ICD-10-CM

## 2022-03-13 LAB — POCT GLYCOSYLATED HEMOGLOBIN (HGB A1C): HbA1c, POC (controlled diabetic range): 8 % — AB (ref 0.0–7.0)

## 2022-03-13 LAB — GLUCOSE, POCT (MANUAL RESULT ENTRY): POC Glucose: 163 mg/dl — AB (ref 70–99)

## 2022-03-13 MED ORDER — MELOXICAM 7.5 MG PO TABS
7.5000 mg | ORAL_TABLET | Freq: Every day | ORAL | 3 refills | Status: DC
  Filled 2022-03-13: qty 30, 30d supply, fill #0

## 2022-03-13 MED ORDER — DICLOFENAC SODIUM 1 % EX GEL
4.0000 g | Freq: Four times a day (QID) | CUTANEOUS | 1 refills | Status: AC
  Filled 2022-03-13: qty 200, 25d supply, fill #0

## 2022-03-13 MED ORDER — GLIPIZIDE ER 10 MG PO TB24
20.0000 mg | ORAL_TABLET | Freq: Every day | ORAL | 6 refills | Status: DC
  Filled 2022-03-13: qty 60, 30d supply, fill #0
  Filled 2022-07-04: qty 180, 90d supply, fill #1
  Filled 2022-09-23: qty 180, 90d supply, fill #2

## 2022-03-13 MED ORDER — DAPAGLIFLOZIN PROPANEDIOL 10 MG PO TABS
10.0000 mg | ORAL_TABLET | Freq: Every day | ORAL | 1 refills | Status: DC
  Filled 2022-03-13: qty 90, 90d supply, fill #0

## 2022-03-13 MED ORDER — LEVOTHYROXINE SODIUM 125 MCG PO TABS
125.0000 ug | ORAL_TABLET | Freq: Every day | ORAL | 0 refills | Status: DC
  Filled 2022-03-13: qty 90, 90d supply, fill #0

## 2022-03-13 MED ORDER — FLUTICASONE PROPIONATE 50 MCG/ACT NA SUSP
2.0000 | Freq: Every day | NASAL | 6 refills | Status: AC
  Filled 2022-03-13: qty 16, 30d supply, fill #0

## 2022-03-13 MED ORDER — METFORMIN HCL 1000 MG PO TABS
1000.0000 mg | ORAL_TABLET | Freq: Two times a day (BID) | ORAL | 1 refills | Status: DC
  Filled 2022-03-13: qty 180, 90d supply, fill #0
  Filled 2022-07-04: qty 180, 90d supply, fill #1

## 2022-03-13 MED ORDER — BENZONATATE 100 MG PO CAPS
200.0000 mg | ORAL_CAPSULE | Freq: Three times a day (TID) | ORAL | 0 refills | Status: DC | PRN
  Filled 2022-03-13: qty 40, 7d supply, fill #0

## 2022-03-13 MED ORDER — TRAZODONE HCL 50 MG PO TABS
25.0000 mg | ORAL_TABLET | Freq: Every evening | ORAL | 3 refills | Status: DC | PRN
  Filled 2022-03-13: qty 30, 30d supply, fill #0

## 2022-03-13 MED ORDER — LISINOPRIL-HYDROCHLOROTHIAZIDE 20-25 MG PO TABS
1.0000 | ORAL_TABLET | Freq: Every day | ORAL | 1 refills | Status: DC
  Filled 2022-03-13 – 2022-07-04 (×2): qty 90, 90d supply, fill #0
  Filled 2022-09-23: qty 90, 90d supply, fill #1

## 2022-03-13 MED ORDER — PIOGLITAZONE HCL 45 MG PO TABS
45.0000 mg | ORAL_TABLET | Freq: Every day | ORAL | 1 refills | Status: DC
  Filled 2022-03-13: qty 90, 90d supply, fill #0
  Filled 2022-08-07: qty 90, 90d supply, fill #1

## 2022-03-13 MED ORDER — AMLODIPINE BESYLATE 10 MG PO TABS
10.0000 mg | ORAL_TABLET | Freq: Every day | ORAL | 1 refills | Status: DC
  Filled 2022-03-13: qty 90, fill #0
  Filled 2022-07-04: qty 90, 90d supply, fill #0
  Filled 2022-09-23: qty 90, 90d supply, fill #1

## 2022-03-13 MED ORDER — ROSUVASTATIN CALCIUM 20 MG PO TABS
20.0000 mg | ORAL_TABLET | Freq: Every day | ORAL | 1 refills | Status: DC
  Filled 2022-03-13: qty 90, 90d supply, fill #0

## 2022-03-13 NOTE — Progress Notes (Signed)
Patient ID: Linda House, female   DOB: 17-Feb-1945, 77 y.o.   MRN: [id number]   Linda House, is a 77 y.o. female  [id number]  [id number]  DOB - [birth year]  Chief Complaint  Patient presents with   Diabetes   Medication Refill       Subjective:   Linda House is a 77 y.o. female here today for med RF.  She has been out of actos for about 5 weeks.  Compliant with other meds.    Also c/o some sneezing and post nasal drip for the last week or so.  She denies fever or cough and denies feeling bad.  Says she does not feel sick.    No problems updated.  ALLERGIES: No Known Allergies  PAST MEDICAL HISTORY: Past Medical History:  Diagnosis Date   Diabetes mellitus    Diabetes mellitus without complication ([place])    Hypertension     MEDICATIONS AT HOME: Prior to Admission medications   Medication Sig Start Date End Date Taking? Authorizing Provider  aspirin EC 81 MG tablet Take 1 tablet (81 mg total) by mouth daily. 08/11/15  Yes Charlott Rakes, MD  benzonatate (TESSALON) 100 MG capsule Take 2 capsules (200 mg total) by mouth 3 (three) times daily as needed. 03/13/22  Yes Enid Maultsby M, PA-C  Blood Glucose Monitoring Suppl (TRUE METRIX METER) w/Device KIT Use to check blood sugar daily. E11.65 02/11/19  Yes Newlin, Charlane Ferretti, MD  cyclobenzaprine (FLEXERIL) 10 MG tablet TAKE 1 TABLET BY MOUTH AT BEDTIME. 09/23/18  Yes Newlin, Enobong, MD  fluticasone (FLONASE) 50 MCG/ACT nasal spray Place 2 sprays into both nostrils daily. 03/13/22  Yes Freeman Caldron M, PA-C  glucose blood test strip Use as instructed to check blood sugar daily. E11.65 02/11/19  Yes Charlott Rakes, MD  meclizine (ANTIVERT) 25 MG tablet Take 1 tablet (25 mg total) by mouth 3 (three) times daily as needed for dizziness. 09/11/21  Yes Noemi Chapel, MD  meloxicam (MOBIC) 7.5 MG tablet Take 1 tablet (7.5 mg total) by mouth daily. Prn pain 03/13/22  Yes Argentina Donovan, PA-C   olopatadine (PATANOL) 0.1 % ophthalmic solution Place 1 drop into both eyes 2 (two) times daily. 11/21/20  Yes Charlott Rakes, MD  TRUEplus Lancets 28G MISC Use to check blood sugar daily. E11.65 02/11/19  Yes Newlin, Charlane Ferretti, MD  amLODipine (NORVASC) 10 MG tablet TAKE 1 TABLET (10 MG TOTAL) BY MOUTH DAILY. 03/13/22 03/13/23  Argentina Donovan, PA-C  dapagliflozin propanediol (FARXIGA) 10 MG TABS tablet Take 1 tablet (10 mg total) by mouth daily before breakfast. 03/13/22   Argentina Donovan, PA-C  diclofenac Sodium (VOLTAREN) 1 % GEL Apply 4 g topically 4 (four) times daily. 03/13/22   Argentina Donovan, PA-C  glipiZIDE (GLIPIZIDE XL) 10 MG 24 hr tablet TAKE 2 TABLETS (20 MG TOTAL) BY MOUTH DAILY. 03/13/22 03/13/23  Argentina Donovan, PA-C  levothyroxine (SYNTHROID) 125 MCG tablet Take 1 tablet (125 mcg total) by mouth daily before breakfast. 03/13/22 03/13/23  Argentina Donovan, PA-C  lisinopril-hydrochlorothiazide (ZESTORETIC) 20-25 MG tablet TAKE 1 TABLET BY MOUTH DAILY. 03/13/22   Argentina Donovan, PA-C  metFORMIN (GLUCOPHAGE) 1000 MG tablet Take 1 tablet (1,000 mg total) by mouth 2 (two) times daily with a meal. 03/13/22   Emilee Market, Dionne Bucy, PA-C  pioglitazone (ACTOS) 45 MG tablet Take 1 tablet (45 mg total) by mouth daily. 03/13/22   Argentina Donovan, PA-C  rosuvastatin (CRESTOR) 20 MG tablet Take 1  tablet (20 mg total) by mouth daily. 03/13/22   Argentina Donovan, PA-C  traZODone (DESYREL) 50 MG tablet Take 0.5-1 tablets (25-50 mg total) by mouth at bedtime as needed for sleep. 03/13/22   Argentina Donovan, PA-C  Calcium 500-125 MG-UNIT TABS Take 1 tablet by mouth daily. Patient not taking: Reported on 12/31/2013 11/19/12 05/30/14  Tresa Garter, MD    ROS: Neg HEENT Neg resp Neg cardiac Neg GI Neg GU Neg MS Neg psych Neg neuro  Objective:   Vitals:   03/13/22 0931  BP: 106/67  Pulse: 75  SpO2: 97%  Weight: 163 lb 3.2 oz (74 kg)  Height: '4\' 11"'$  (1.499 m)   Exam General  appearance : Awake, alert, not in any distress. Speech Clear. Not toxic looking HEENT: Atraumatic and Normocephalic Neck: Supple, no JVD. No cervical lymphadenopathy.  Chest: Good air entry bilaterally, CTAB.  No rales/rhonchi/wheezing CVS: S1 S2 regular, no murmurs.  Extremities: B/L Lower Ext shows no edema, both legs are warm to touch Neurology: Awake alert, and oriented X 3, CN II-XII intact, Non focal Skin: No Rash  Data Review Lab Results  Component Value Date   HGBA1C 8.0 (A) 03/13/2022   HGBA1C 8.5 (A) 11/29/2021   HGBA1C 7.3 (A) 05/22/2021    Assessment & Plan   1. Type 2 diabetes mellitus with hyperglycemia, with long-term current use of insulin (HCC) Uncontrolled but improved.  She was out of actos for about 4-5 weeks - Glucose (CBG) - HgB A1c - metFORMIN (GLUCOPHAGE) 1000 MG tablet; Take 1 tablet (1,000 mg total) by mouth 2 (two) times daily with a meal.  Dispense: 180 tablet; Refill: 1 - dapagliflozin propanediol (FARXIGA) 10 MG TABS tablet; Take 1 tablet (10 mg total) by mouth daily before breakfast.  Dispense: 90 tablet; Refill: 1 - pioglitazone (ACTOS) 45 MG tablet; Take 1 tablet (45 mg total) by mouth daily.  Dispense: 90 tablet; Refill: 1 - glipiZIDE (GLIPIZIDE XL) 10 MG 24 hr tablet; TAKE 2 TABLETS (20 MG TOTAL) BY MOUTH DAILY.  Dispense: 60 tablet; Refill: 6  2. Hypothyroidism, unspecified type - levothyroxine (SYNTHROID) 125 MCG tablet; Take 1 tablet (125 mcg total) by mouth daily before breakfast.  Dispense: 90 tablet; Refill: 0 - Thyroid Panel With TSH  3. Essential hypertension, benign controlled - amLODipine (NORVASC) 10 MG tablet; TAKE 1 TABLET (10 MG TOTAL) BY MOUTH DAILY.  Dispense: 90 tablet; Refill: 1 - lisinopril-hydrochlorothiazide (ZESTORETIC) 20-25 MG tablet; TAKE 1 TABLET BY MOUTH DAILY.  Dispense: 90 tablet; Refill: 1  4. Other insomnia - traZODone (DESYREL) 50 MG tablet; Take 0.5-1 tablets (25-50 mg total) by mouth at bedtime as needed for  sleep.  Dispense: 30 tablet; Refill: 3  5. Primary osteoarthritis of left knee - diclofenac Sodium (VOLTAREN) 1 % GEL; Apply 4 g topically 4 (four) times daily.  Dispense: 150 g; Refill: 1 - meloxicam (MOBIC) 7.5 MG tablet; Take 1 tablet (7.5 mg total) by mouth daily. Prn pain  Dispense: 30 tablet; Refill: 3  6. Congestion of nasal sinus - fluticasone (FLONASE) 50 MCG/ACT nasal spray; Place 2 sprays into both nostrils daily.  Dispense: 16 g; Refill: 6 - benzonatate (TESSALON) 100 MG capsule; Take 2 capsules (200 mg total) by mouth 3 (three) times daily as needed.  Dispense: 40 capsule; Refill: 0  7. Dyslipidemia - rosuvastatin (CRESTOR) 20 MG tablet; Take 1 tablet (20 mg total) by mouth daily.  Dispense: 90 tablet; Refill: 1  8. Language barrier AMN interpreters used and  additional time performing visit was required. Alondra    Return in about 3 months (around 06/11/2022) for Dr Evie Lacks for chronic conditions.  The patient was given clear instructions to go to ER or return to medical center if symptoms don't improve, worsen or new problems develop. The patient verbalized understanding. The patient was told to call to get lab results if they haven't heard anything in the next week.      Freeman Caldron, PA-C [facility] [place], [place]   03/13/2022, 9:57 AM

## 2022-03-14 LAB — THYROID PANEL WITH TSH
Free Thyroxine Index: 3.2 (ref 1.2–4.9)
T3 Uptake Ratio: 30 % (ref 24–39)
T4, Total: 10.5 ug/dL (ref 4.5–12.0)
TSH: 2.09 u[IU]/mL (ref 0.450–4.500)

## 2022-06-13 ENCOUNTER — Encounter: Payer: Self-pay | Admitting: Family Medicine

## 2022-06-13 ENCOUNTER — Other Ambulatory Visit: Payer: Self-pay

## 2022-06-13 ENCOUNTER — Ambulatory Visit: Payer: Self-pay | Attending: Family Medicine | Admitting: Family Medicine

## 2022-06-13 VITALS — BP 122/76 | HR 78 | Ht 59.0 in | Wt 164.2 lb

## 2022-06-13 DIAGNOSIS — R011 Cardiac murmur, unspecified: Secondary | ICD-10-CM

## 2022-06-13 DIAGNOSIS — Z1159 Encounter for screening for other viral diseases: Secondary | ICD-10-CM

## 2022-06-13 DIAGNOSIS — Z794 Long term (current) use of insulin: Secondary | ICD-10-CM

## 2022-06-13 DIAGNOSIS — M1712 Unilateral primary osteoarthritis, left knee: Secondary | ICD-10-CM

## 2022-06-13 DIAGNOSIS — E1165 Type 2 diabetes mellitus with hyperglycemia: Secondary | ICD-10-CM | POA: Insufficient documentation

## 2022-06-13 DIAGNOSIS — Z713 Dietary counseling and surveillance: Secondary | ICD-10-CM | POA: Insufficient documentation

## 2022-06-13 DIAGNOSIS — E785 Hyperlipidemia, unspecified: Secondary | ICD-10-CM | POA: Insufficient documentation

## 2022-06-13 DIAGNOSIS — I10 Essential (primary) hypertension: Secondary | ICD-10-CM | POA: Insufficient documentation

## 2022-06-13 DIAGNOSIS — E039 Hypothyroidism, unspecified: Secondary | ICD-10-CM

## 2022-06-13 LAB — POCT GLYCOSYLATED HEMOGLOBIN (HGB A1C): HbA1c, POC (controlled diabetic range): 7.5 % — AB (ref 0.0–7.0)

## 2022-06-13 LAB — GLUCOSE, POCT (MANUAL RESULT ENTRY): POC Glucose: 114 mg/dl — AB (ref 70–99)

## 2022-06-13 NOTE — Patient Instructions (Signed)
Soplo cardaco Heart Murmur Un soplo cardaco es un sonido extra cuya causa es el flujo sanguneo turbulento a travs de las vlvulas del corazn. El soplo se puede or como un "zumbido" o "silbido" cuando la sangre atraviesa el corazn. El corazn tiene cuatro reas Field seismologist. Hay una vlvula para cada cavidad del corazn. La sangre pasa a travs de una vlvula antes de salir de la cavidad. Dos de las vlvulas mueven la sangre desde las cavidades superiores del corazn hasta las cavidades inferiores del corazn (vlvula tricspide y vlvula mitral). Las [employer] vlvulas (vlvula artica y vlvula pulmonar) mueven la sangre a los pulmones y al resto del cuerpo. Las vlvulas mantienen la sangre movindose a travs del Oceanographer. El funcionamiento incorrecto de las vlvulas cardacas puede causar un soplo. Existen dos tipos de soplos cardacos: Soplo (benigno) funcional. La mayora de las personas con este tipo de soplo no tienen un problema cardaco. Muchos nios tienen soplos cardacos funcionales. Su mdico puede sugerir algunos estudios bsicos para saber si el soplo es un soplo funcional. Si se descubre un soplo cardaco funcional, no hay necesidad de [employer], Tax inspector, restringir las actividades ni suspender la prctica de deportes. Soplo cardaco anormal. Este tipo de soplo cardaco puede aparecer en nios y adultos. Los soplos anormales pueden ser un signo de una enfermedad cardaca ms grave, como una anomala cardaca presente al nacer (anomala congnita) o una enfermedad de las vlvulas cardacas. Cules son las causas? Entre otras causas de esta afeccin se pueden mencionar las siguientes: Psychiatrist. Grant Ruts. Hipertiroidismo (glndula tiroidea hiperactiva). Anemia. Actividad fsica. Perodos de crecimiento acelerado en los nios. Cules son los signos o sntomas? Los soplos cardacos funcionales no provocan sntomas y [employer] con soplos anormales pueden no tener sntomas. Si hay sntomas, estos pueden ser los siguientes: Dificultad para respirar o tos persistente. Color azul en la piel, especialmente en las puntas de los dedos. Dolor en el pecho. Palpitaciones, sentir un aleteo o latido cardaco irregular. Desmayos. Cansarse con mucha ms rapidez de lo esperado. Hinchazn del abdomen, los pies o los tobillos. Cmo se diagnostica? Esta afeccin se puede diagnosticar durante un examen fsico de rutina o de otro tipo. Si su mdico detecta un soplo con un estetoscopio, tratar de escuchar lo siguiente: Dnde est ubicado el soplo en su corazn. Qu intensidad sonora tiene. Esto le puede ayudar al mdico a descifrar las causas del soplo. Es posible que lo deriven a Physicist, medical (cardilogo). Tambin pueden hacerle otras pruebas, incluidas las siguientes: Electrocardiograma (ECG o EKG). Este estudio mide la actividad elctrica del corazn. Ecocardiograma. Este estudio [place] ondas sonoras de alta frecuencia para tomar imgenes de su corazn. Radiografa de trax. Imgenes por Health visitor (IRM). Cateterismo cardaco. Este estudio observa el flujo sanguneo a travs de las arterias que rodean el corazn. En el caso de nios y adultos que tienen un soplo cardaco anormal y quieren mantenerse [employer], es importante que hagan lo siguiente: [employer]. Analizar los [employer] con su mdico. Recibir las recomendaciones del mdico. Si hay enfermedad cardaca, puede no ser seguro jugar o participar en actividades que requieran mucho esfuerzo y Engineer, drilling (que sean extenuantes). Cmo se trata? Los soplos no necesitan tratamiento. En algunos casos, los soplos pueden desaparecer por s solos. Si lo que est causando el soplo es una enfermedad o trastorno preexistente, es posible que necesite [place]. Si es necesario un tratamiento, ste depender del tipo y la gravedad de la enfermedad  o  el trastorno cardaco que causa el soplo. El tratamiento puede incluir: Medicamentos. Ciruga. Cambios en el estilo de vida y la dieta. Siga estas indicaciones en su casa: Consulte a su mdico antes de participar en deportes u otras actividades que sean extenuantes. Busque toda la informacin posible sobre su afeccin y cualquier enfermedad relacionada. Pregntele a su mdico si es probable que usted est en riesgo de alguna emergencia mdica. Hable con su mdico sobre los sntomas a los que debera prestarle atencin. Es su responsabilidad retirar los [employer] de la prueba. Consulte al mdico o pregunte en el departamento donde se realiza la prueba cundo estarn [employer]. Concurra a todas las visitas de seguimiento. Esto es importante. Comunquese con un mdico si: Le falta el aire frecuentemente. Se siente ms cansado que de costumbre. Le cuesta mantener el ritmo de las actividades normales o de las rutinas de ejercicios fsicos. Observa hinchazn en los tobillos o en los pies. Nota que su corazn a menudo late de forma irregular. Presenta algn sntoma nuevo. Solicite ayuda de inmediato si: Midwife. Presenta dificultades respiratorias sbitas. Se siente mareado o se desmaya. Los sntomas empeoran repentinamente. Estos sntomas pueden representar un problema grave que constituye Radio broadcast assistant. No espere a ver si los sntomas desaparecen. Solicite atencin mdica de inmediato. Comunquese con el servicio de emergencias de su localidad (911 en los [place]). No conduzca por sus propios medios [employer]. Resumen Las vlvulas cardacas mantienen la sangre movindose a travs del corazn en la direccin correcta. El funcionamiento incorrecto de las vlvulas puede causar un soplo. Los soplos cardacos funcionales no provocan sntomas y [employer] con soplos anormales pueden no tener sntomas. Si lo que est causando el soplo es una enfermedad  o trastorno preexistente, es posible que necesite [place]. El tratamiento puede incluir medicamentos, [place] o cambios en el estilo de vida y Psychologist, forensic. Consulte a su mdico antes de participar en deportes u otras actividades que sean extenuantes. Busque ayuda de inmediato si tiene dolor de pecho o problemas para respirar. Esta informacin no tiene Theme park manager el consejo del mdico. Asegrese de hacerle al mdico cualquier pregunta que tenga. Document Revised: 04/28/2020 Document Reviewed: 04/28/2020 Elsevier Patient Education  2024 [employer].

## 2022-06-13 NOTE — Progress Notes (Signed)
Subjective:  Patient ID: Linda House, female    DOB: May 01, 1945  Age: 77 y.o. MRN: [id number]  CC: Diabetes   HPI Linda House is a 77 y.o. year old female with a history of is a 77 y.o. year old female with a history of  type 2 diabetes mellitus (A1c 7.5), hypothyroidism, hyperlipidemia, hypertension.     Interval History:  A1c is 7.5 down from 8.5 is doing well on her medications with no hypoglycemia, no neuropathy, no visual concerns.  She is not up-to-date on annual eye exams due to lack of medical coverage. Endorses adherence with her statin and antihypertensive.  She is also doing well on levothyroxine for hypothyroidism. Her knees have been hurting and pain occurs both at rest and with activity. She does have Meloxicam which is somewhat effective.  Past Medical History:  Diagnosis Date   Diabetes mellitus    Diabetes mellitus without complication (HCC)    Hypertension     No past surgical history on file.  Family History  Problem Relation Age of Onset   Diabetes Father    Hypertension Father     Social History   Socioeconomic History   Marital status: Married    Spouse name: Not on file   Number of children: Not on file   Years of education: Not on file   Highest education level: Not on file  Occupational History   Not on file  Tobacco Use   Smoking status: Never   Smokeless tobacco: Never  Vaping Use   Vaping Use: Never used  Substance and Sexual Activity   Alcohol use: No   Drug use: No   Sexual activity: Never  Other Topics Concern   Not on file  Social History Narrative   ** Merged History Encounter **       Social Determinants of Health   Financial Resource Strain: Not on file  Food Insecurity: Not on file  Transportation Needs: Not on file  Physical Activity: Not on file  Stress: Not on file  Social Connections: Not on file    No Known Allergies  Outpatient Medications Prior to Visit  Medication Sig  Dispense Refill   amLODipine (NORVASC) 10 MG tablet Take 1 tablet (10 mg total) by mouth daily. 90 tablet 1   aspirin EC 81 MG tablet Take 1 tablet (81 mg total) by mouth daily. 30 tablet 3   benzonatate (TESSALON) 100 MG capsule Take 2 capsules (200 mg total) by mouth 3 (three) times daily as needed. 40 capsule 0   Blood Glucose Monitoring Suppl (TRUE METRIX METER) w/Device KIT Use to check blood sugar daily. E11.65 1 kit 0   cyclobenzaprine (FLEXERIL) 10 MG tablet TAKE 1 TABLET BY MOUTH AT BEDTIME. 30 tablet 0   dapagliflozin propanediol (FARXIGA) 10 MG TABS tablet Take 1 tablet (10 mg total) by mouth daily before breakfast. 90 tablet 1   diclofenac Sodium (VOLTAREN) 1 % GEL Apply 4 g topically 4 (four) times daily. 150 g 1   fluticasone (FLONASE) 50 MCG/ACT nasal spray Place 2 sprays into both nostrils daily. 16 g 6   glipiZIDE (GLIPIZIDE XL) 10 MG 24 hr tablet Take 2 tablets (20 mg total) by mouth daily. 60 tablet 6   glucose blood test strip Use as instructed to check blood sugar daily. E11.65 100 each 11   levothyroxine (SYNTHROID) 125 MCG tablet Take 1 tablet (125 mcg total) by mouth daily before breakfast. 90 tablet 0   lisinopril-hydrochlorothiazide (ZESTORETIC)  20-25 MG tablet TAKE 1 TABLET BY MOUTH DAILY. 90 tablet 1   meclizine (ANTIVERT) 25 MG tablet Take 1 tablet (25 mg total) by mouth 3 (three) times daily as needed for dizziness. 30 tablet 0   meloxicam (MOBIC) 7.5 MG tablet Take 1 tablet (7.5 mg total) by mouth daily. Prn pain 30 tablet 3   metFORMIN (GLUCOPHAGE) 1000 MG tablet Take 1 tablet (1,000 mg total) by mouth 2 (two) times daily with a meal. 180 tablet 1   olopatadine (PATANOL) 0.1 % ophthalmic solution Place 1 drop into both eyes 2 (two) times daily. 5 mL 1   pioglitazone (ACTOS) 45 MG tablet Take 1 tablet (45 mg total) by mouth daily. 90 tablet 1   rosuvastatin (CRESTOR) 20 MG tablet Take 1 tablet (20 mg total) by mouth daily. 90 tablet 1   traZODone (DESYREL) 50 MG  tablet Take 0.5-1 tablets (25-50 mg total) by mouth at bedtime as needed for sleep. 30 tablet 3   TRUEplus Lancets 28G MISC Use to check blood sugar daily. E11.65 100 each 11   No facility-administered medications prior to visit.     ROS Review of Systems  Constitutional:  Negative for activity change and appetite change.  HENT:  Negative for sinus pressure and sore throat.   Respiratory:  Negative for chest tightness, shortness of breath and wheezing.   Cardiovascular:  Negative for chest pain and palpitations.  Gastrointestinal:  Negative for abdominal distention, abdominal pain and constipation.  Genitourinary: Negative.   Musculoskeletal:        See HPI  Psychiatric/Behavioral:  Negative for behavioral problems and dysphoric mood.     Objective:  BP 122/76   Pulse 78   Ht 4\' 11"  (1.499 m)   Wt 164 lb 3.2 oz (74.5 kg)   SpO2 96%   BMI 33.16 kg/m      06/13/2022    9:17 AM 03/13/2022    9:31 AM 11/29/2021    8:40 AM  BP/Weight  Systolic BP 122 106 151  Diastolic BP 76 67 77  Wt. (Lbs) 164.2 163.2 165.2  BMI 33.16 kg/m2 32.96 kg/m2 32.26 kg/m2      Physical Exam Constitutional:      Appearance: She is well-developed.  Cardiovascular:     Rate and Rhythm: Normal rate.     Heart sounds: Murmur heard.  Pulmonary:     Effort: Pulmonary effort is normal.     Breath sounds: Normal breath sounds. No wheezing or rales.  Chest:     Chest wall: No tenderness.  Abdominal:     General: Bowel sounds are normal. There is no distension.     Palpations: Abdomen is soft. There is no mass.     Tenderness: There is no abdominal tenderness.  Musculoskeletal:        General: Normal range of motion.     Right lower leg: No edema.     Left lower leg: No edema.     Comments: Crepitus in both knees , no TTP  Neurological:     Mental Status: She is alert and oriented to person, place, and time.  Psychiatric:        Mood and Affect: Mood normal.        Latest Ref Rng &  Units 11/29/2021    9:20 AM 09/11/2021    3:53 PM 05/22/2021    9:50 AM  CMP  Glucose 70 - 99 mg/dL 94  161  096   BUN 8 - 27  mg/dL 7  17  10    Creatinine 0.57 - 1.00 mg/dL 9.60  4.54  0.98   Sodium 134 - 144 mmol/L 139  140  138   Potassium 3.5 - 5.2 mmol/L 4.7  4.3  4.7   Chloride 96 - 106 mmol/L 102  102  100   CO2 20 - 29 mmol/L 25  25  26    Calcium 8.7 - 10.3 mg/dL 9.0  9.4  9.2   Total Protein 6.0 - 8.5 g/dL 6.7   7.4   Total Bilirubin 0.0 - 1.2 mg/dL 0.2   0.4   Alkaline Phos 44 - 121 IU/L 143   179   AST 0 - 40 IU/L 17   18   ALT 0 - 32 IU/L 14   21     Lipid Panel     Component Value Date/Time   CHOL 274 (H) 11/29/2021 0920   TRIG 203 (H) 11/29/2021 0920   HDL 54 11/29/2021 0920   CHOLHDL 3.4 05/24/2020 0918   CHOLHDL 3.4 02/19/2016 1021   VLDL 25 08/29/2015 0849   LDLCALC 182 (H) 11/29/2021 0920    CBC    Component Value Date/Time   WBC 8.8 09/11/2021 1553   RBC 4.80 09/11/2021 1553   HGB 12.7 09/11/2021 1553   HGB 12.2 08/27/2016 0919   HCT 39.2 09/11/2021 1553   HCT 38.2 08/27/2016 0919   PLT 326 09/11/2021 1553   PLT 355 08/27/2016 0919   MCV 81.7 09/11/2021 1553   MCV 79 08/27/2016 0919   MCH 26.5 09/11/2021 1553   MCHC 32.4 09/11/2021 1553   RDW 14.4 09/11/2021 1553   RDW 15.2 08/27/2016 0919   LYMPHSABS 3.0 11/02/2020 2109   LYMPHSABS 2.5 08/27/2016 0919   MONOABS 0.5 11/02/2020 2109   EOSABS 0.2 11/02/2020 2109   EOSABS 0.2 08/27/2016 0919   BASOSABS 0.0 11/02/2020 2109   BASOSABS 0.0 08/27/2016 0919    Lab Results  Component Value Date   HGBA1C 7.5 (A) 06/13/2022    Lab Results  Component Value Date   TSH 2.090 03/13/2022    Assessment & Plan:  1. Type 2 diabetes mellitus with hyperglycemia, with long-term current use of insulin (HCC) Improved with A1c of 7.5 down from 8.5 previously Continue current regimen and she has been commended on improvement Counseled on Diabetic diet, my plate method, 119 minutes of moderate intensity  exercise/week Blood sugar logs with fasting goals of 80-120 mg/dl, random of less than 147 and in the event of sugars less than 60 mg/dl or greater than 829 mg/dl encouraged to notify the clinic. Advised on the need for annual eye exams, annual foot exams, Pneumonia vaccine. - POCT glycosylated hemoglobin (Hb A1C) - POCT glucose (manual entry) - Microalbumin / creatinine urine ratio - LP+Non-HDL Cholesterol - CMP14+EGFR  2. Need for hepatitis C screening test - HCV Ab w Reflex to Quant PCR  3. Murmur, cardiac EKG with normal sinus rhythm, no ischemic findings - ECHOCARDIOGRAM COMPLETE; Future  4. Hypothyroidism, unspecified type Normal, Will send of thyroid labs and adjust regimen accordingly - T4, free - TSH - T3  5. Primary osteoarthritis of left knee Stable with intermittent flares Symptoms improved when she uses meloxicam She uses meloxicam sparingly and has not been advised to use this as needed for pain  No orders of the defined types were placed in this encounter.   Follow-up: Return in about 3 months (around 09/13/2022).       Hoy Register, MD,  FAAFP. [facility] [place], [place] [phone]   06/13/2022, 10:08 AM

## 2022-06-14 ENCOUNTER — Other Ambulatory Visit: Payer: Self-pay | Admitting: Family Medicine

## 2022-06-14 ENCOUNTER — Other Ambulatory Visit: Payer: Self-pay

## 2022-06-14 DIAGNOSIS — E785 Hyperlipidemia, unspecified: Secondary | ICD-10-CM

## 2022-06-14 LAB — CMP14+EGFR
ALT: 16 IU/L (ref 0–32)
AST: 13 IU/L (ref 0–40)
Albumin/Globulin Ratio: 1.4 (ref 1.2–2.2)
Albumin: 4.2 g/dL (ref 3.8–4.8)
Alkaline Phosphatase: 148 IU/L — ABNORMAL HIGH (ref 44–121)
BUN/Creatinine Ratio: 14 (ref 12–28)
BUN: 10 mg/dL (ref 8–27)
Bilirubin Total: 0.3 mg/dL (ref 0.0–1.2)
CO2: 23 mmol/L (ref 20–29)
Calcium: 9.3 mg/dL (ref 8.7–10.3)
Chloride: 102 mmol/L (ref 96–106)
Creatinine, Ser: 0.7 mg/dL (ref 0.57–1.00)
Globulin, Total: 3 g/dL (ref 1.5–4.5)
Glucose: 92 mg/dL (ref 70–99)
Potassium: 4.8 mmol/L (ref 3.5–5.2)
Sodium: 139 mmol/L (ref 134–144)
Total Protein: 7.2 g/dL (ref 6.0–8.5)
eGFR: 90 mL/min/{1.73_m2} (ref >59)

## 2022-06-14 LAB — T4, FREE: Free T4: 1.51 ng/dL (ref 0.82–1.77)

## 2022-06-14 LAB — LP+NON-HDL CHOLESTEROL
Cholesterol, Total: 302 mg/dL — ABNORMAL HIGH (ref 100–199)
HDL: 60 mg/dL (ref >39)
LDL Chol Calc (NIH): 210 mg/dL — ABNORMAL HIGH (ref 0–99)
Total Non-HDL-Chol (LDL+VLDL): 242 mg/dL — ABNORMAL HIGH (ref 0–129)
Triglycerides: 169 mg/dL — ABNORMAL HIGH (ref 0–149)
VLDL Cholesterol Cal: 32 mg/dL (ref 5–40)

## 2022-06-14 LAB — HCV AB W REFLEX TO QUANT PCR: HCV Ab: NONREACTIVE

## 2022-06-14 LAB — HCV INTERPRETATION

## 2022-06-14 LAB — TSH: TSH: 3.36 u[IU]/mL (ref 0.450–4.500)

## 2022-06-14 LAB — MICROALBUMIN / CREATININE URINE RATIO
Creatinine, Urine: 64.4 mg/dL
Microalb/Creat Ratio: 18 mg/g creat (ref 0–29)
Microalbumin, Urine: 11.4 ug/mL

## 2022-06-14 LAB — T3: T3, Total: 118 ng/dL (ref 71–180)

## 2022-06-14 MED ORDER — ROSUVASTATIN CALCIUM 40 MG PO TABS
40.0000 mg | ORAL_TABLET | Freq: Every day | ORAL | 1 refills | Status: DC
Start: 2022-06-14 — End: 2022-12-19
  Filled 2022-06-14: qty 30, 30d supply, fill #0

## 2022-06-20 ENCOUNTER — Other Ambulatory Visit: Payer: Self-pay

## 2022-06-27 ENCOUNTER — Ambulatory Visit (HOSPITAL_COMMUNITY): Admission: RE | Admit: 2022-06-27 | Payer: Self-pay | Source: Ambulatory Visit

## 2022-07-04 ENCOUNTER — Other Ambulatory Visit: Payer: Self-pay | Admitting: Physician Assistant

## 2022-07-04 ENCOUNTER — Other Ambulatory Visit: Payer: Self-pay

## 2022-07-04 DIAGNOSIS — E039 Hypothyroidism, unspecified: Secondary | ICD-10-CM

## 2022-07-04 MED ORDER — LEVOTHYROXINE SODIUM 125 MCG PO TABS
125.0000 ug | ORAL_TABLET | Freq: Every day | ORAL | 1 refills | Status: DC
Start: 2022-07-04 — End: 2022-12-20
  Filled 2022-07-04 – 2022-09-23 (×2): qty 90, 90d supply, fill #0

## 2022-07-04 NOTE — Telephone Encounter (Signed)
Requested Prescriptions  Pending Prescriptions Disp Refills   levothyroxine (SYNTHROID) 125 MCG tablet 90 tablet 1    Sig: Take 1 tablet (125 mcg total) by mouth daily before breakfast.     Endocrinology:  Hypothyroid Agents Passed - 07/04/2022 12:47 PM      Passed - TSH in normal range and within 360 days    TSH  Date Value Ref Range Status  06/13/2022 3.360 0.450 - 4.500 uIU/mL Final         Passed - Valid encounter within last 12 months    Recent Outpatient Visits           3 weeks ago Type 2 diabetes mellitus with hyperglycemia, with long-term current use of insulin (HCC)   [place] [facility] [place], [place], [place]   3 months ago Type 2 diabetes mellitus with hyperglycemia, with long-term current use of insulin [facility])   [place] [facility] [place], [place], [place]   7 months ago Type 2 diabetes mellitus with hyperglycemia, with long-term current use of insulin [facility])   [place] [facility] [place], Odette Horns, MD   1 year ago Type 2 diabetes mellitus with hyperglycemia, with long-term current use of insulin [facility])   [place] [facility] Hoy Register, MD   1 year ago Annual physical exam    [facility] Hoy Register, MD       Future Appointments             In 2 months Hoy Register, MD [facility]

## 2022-07-10 ENCOUNTER — Other Ambulatory Visit: Payer: Self-pay

## 2022-08-07 ENCOUNTER — Other Ambulatory Visit: Payer: Self-pay

## 2022-08-09 ENCOUNTER — Other Ambulatory Visit: Payer: Self-pay

## 2022-09-19 ENCOUNTER — Ambulatory Visit: Payer: Self-pay | Attending: Family Medicine | Admitting: Family Medicine

## 2022-09-19 ENCOUNTER — Other Ambulatory Visit: Payer: Self-pay

## 2022-09-19 ENCOUNTER — Other Ambulatory Visit (HOSPITAL_COMMUNITY)
Admission: RE | Admit: 2022-09-19 | Discharge: 2022-09-19 | Disposition: A | Discharge status: Home / Self Care | Payer: Self-pay | Source: Ambulatory Visit | Attending: Family Medicine | Admitting: Family Medicine

## 2022-09-19 VITALS — BP 153/73 | HR 74 | Ht 59.0 in | Wt 166.2 lb

## 2022-09-19 DIAGNOSIS — E785 Hyperlipidemia, unspecified: Secondary | ICD-10-CM

## 2022-09-19 DIAGNOSIS — Z794 Long term (current) use of insulin: Secondary | ICD-10-CM

## 2022-09-19 DIAGNOSIS — I10 Essential (primary) hypertension: Secondary | ICD-10-CM

## 2022-09-19 DIAGNOSIS — E1165 Type 2 diabetes mellitus with hyperglycemia: Secondary | ICD-10-CM

## 2022-09-19 DIAGNOSIS — N898 Other specified noninflammatory disorders of vagina: Secondary | ICD-10-CM | POA: Insufficient documentation

## 2022-09-19 DIAGNOSIS — Z7984 Long term (current) use of oral hypoglycemic drugs: Secondary | ICD-10-CM

## 2022-09-19 DIAGNOSIS — H6121 Impacted cerumen, right ear: Secondary | ICD-10-CM

## 2022-09-19 DIAGNOSIS — E1169 Type 2 diabetes mellitus with other specified complication: Secondary | ICD-10-CM

## 2022-09-19 DIAGNOSIS — M1712 Unilateral primary osteoarthritis, left knee: Secondary | ICD-10-CM

## 2022-09-19 LAB — POCT GLYCOSYLATED HEMOGLOBIN (HGB A1C): HbA1c, POC (controlled diabetic range): 7.5 % — AB (ref 0.0–7.0)

## 2022-09-19 MED ORDER — TRAMADOL HCL 50 MG PO TABS
50.0000 mg | ORAL_TABLET | Freq: Every evening | ORAL | 1 refills | Status: DC | PRN
Start: 2022-09-19 — End: 2023-10-02
  Filled 2022-09-19: qty 30, 30d supply, fill #0

## 2022-09-19 MED ORDER — CLOTRIMAZOLE 1 % EX CREA
1.0000 | TOPICAL_CREAM | Freq: Two times a day (BID) | CUTANEOUS | 0 refills | Status: AC
  Filled 2022-09-19: qty 30, 15d supply, fill #0

## 2022-09-19 MED ORDER — METFORMIN HCL 1000 MG PO TABS
1000.0000 mg | ORAL_TABLET | Freq: Two times a day (BID) | ORAL | 1 refills | Status: DC
Start: 2022-09-19 — End: 2022-12-19
  Filled 2022-09-19 – 2022-09-23 (×2): qty 180, 90d supply, fill #0

## 2022-09-19 MED ORDER — PIOGLITAZONE HCL 45 MG PO TABS
45.0000 mg | ORAL_TABLET | Freq: Every day | ORAL | 1 refills | Status: DC
Start: 2022-09-19 — End: 2022-12-19
  Filled 2022-09-19: qty 90, 90d supply, fill #0

## 2022-09-19 NOTE — Patient Instructions (Addendum)
Diabetes mellitus y nutricin, en adultos Diabetes Mellitus and Nutrition, Adult Si sufre de diabetes, o diabetes mellitus, es muy importante tener hbitos alimenticios saludables debido a que sus niveles de azcar en la sangre (glucosa) se ven afectados en gran medida por lo que come y bebe. Comer alimentos saludables en las cantidades correctas, aproximadamente a la misma hora todos los das, lo ayudar a: Controlar su glucemia. Disminuir el riesgo de sufrir una enfermedad cardaca. Mejorar la presin arterial. Alcanzar o mantener un peso saludable. Qu puede afectar mi plan de alimentacin? Todas las personas que sufren de diabetes son diferentes y cada una tiene necesidades diferentes en cuanto a un plan de alimentacin. El mdico puede recomendarle que trabaje con un nutricionista para elaborar el mejor plan para usted. Su plan de alimentacin puede variar segn factores como: Las caloras que necesita. Los medicamentos que toma. Su peso. Sus niveles de glucemia, presin arterial y colesterol. Su nivel de actividad. Otras afecciones que tenga, como enfermedades cardacas o renales. Cmo me afectan los carbohidratos? Los carbohidratos, o hidratos de carbono, afectan su nivel de glucemia ms que cualquier otro tipo de alimento. La ingesta de carbohidratos aumenta la cantidad de glucosa en la sangre. Es importante conocer la cantidad de carbohidratos que se pueden ingerir en cada comida sin correr ningn riesgo. Esto es diferente en cada persona. Su nutricionista puede ayudarlo a calcular la cantidad de carbohidratos que debe ingerir en cada comida y en cada refrigerio. Cmo me afecta el alcohol? El alcohol puede provocar una disminucin de la glucemia (hipoglucemia), especialmente si usa insulina o toma determinados medicamentos por va oral para la diabetes. La hipoglucemia es una afeccin potencialmente mortal. Los sntomas de la hipoglucemia, como somnolencia, mareos y confusin, son  similares a los sntomas de haber consumido demasiado alcohol. No beba alcohol si: Su mdico le indica no hacerlo. Est embarazada, puede estar embarazada o est tratando de quedar embarazada. Si bebe alcohol: Limite la cantidad que bebe a lo siguiente: De 0 a 1 medida por da para las mujeres. De 0 a 2 medidas por da para los hombres. Sepa cunta cantidad de alcohol hay en las bebidas que toma. En los [place], una medida equivale a una botella de cerveza de 12 oz (355 ml), un vaso de vino de 5 oz (148 ml) o un vaso de una bebida alcohlica de alta graduacin de 1 oz (44 ml). Mantngase hidratado bebiendo agua, refrescos dietticos o t helado sin azcar. Tenga en cuenta que los refrescos comunes, los jugos y otras bebidas para mezclar pueden contener mucha azcar y se deben contar como carbohidratos. Consejos para seguir este plan  Leer las etiquetas de los alimentos Comience por leer el tamao de la porcin en la etiqueta de Informacin nutricional de los alimentos envasados y las bebidas. La cantidad de caloras, carbohidratos, grasas y otros nutrientes detallados en la etiqueta se basan en una porcin del alimento. Muchos alimentos contienen ms de una porcin por envase. Verifique la cantidad total de gramos (g) de carbohidratos totales en una porcin. Verifique la cantidad de gramos de grasas saturadas y grasas trans en una porcin. Escoja alimentos que no contengan estas grasas o que su contenido de estas sea bajo. Verifique la cantidad de miligramos (mg) de sal (sodio) en una porcin. La mayora de las personas deben limitar la ingesta de sodio total a menos de 2300 mg por da. Siempre consulte la informacin nutricional de los alimentos etiquetados como "con bajo contenido de grasa" o "sin grasa".   Estos alimentos pueden tener un mayor contenido de azcar agregada o carbohidratos refinados, y deben evitarse. Hable con su nutricionista para identificar sus objetivos diarios en  cuanto a los nutrientes mencionados en la etiqueta. Al ir de compras Evite comprar alimentos procesados, enlatados o precocidos. Estos alimentos tienden a tener una mayor cantidad de grasa, sodio y azcar agregada. Compre en la zona exterior de la tienda de comestibles. Esta es la zona donde se encuentran con mayor frecuencia las frutas y las verduras frescas, los cereales a granel, las carnes frescas y los productos lcteos frescos. Al cocinar Use mtodos de coccin a baja temperatura, como hornear, en lugar de mtodos de coccin a alta temperatura, como frer en abundante aceite. Cocine con aceites saludables, como el aceite de oliva, canola o girasol. Evite cocinar con manteca, crema o carnes con alto contenido de grasa. Planificacin de las comidas Coma las comidas y los refrigerios regularmente, preferentemente a la misma hora todos los das. Evite pasar largos perodos de tiempo sin comer. Consuma alimentos ricos en fibra, como frutas frescas, verduras, frijoles y cereales integrales. Consuma entre 4 y 6 onzas (entre 112 y 168 g) de protenas magras por da, como carnes magras, pollo, pescado, huevos o tofu. Una onza (oz) (28 g) de protena magra equivale a: 1 onza (28 g) de carne, pollo o pescado. 1 huevo.  taza (62 g) de tofu. Coma algunos alimentos por da que contengan grasas saludables, como aguacates, frutos secos, semillas y pescado. Qu alimentos debo comer? Frutas Bayas. Manzanas. Naranjas. Duraznos. Damascos. Ciruelas. Uvas. Mangos. Papayas. Granadas. Kiwi. Cerezas. Verduras Verduras de hoja verde, que incluyen lechuga, espinaca, col rizada, acelga, hojas de berza, hojas de mostaza y repollo. Remolachas. Coliflor. Brcoli. Zanahorias. Judas verdes. Tomates. Pimientos. Cebollas. Pepinos. Coles de Bruselas. Granos Granos integrales, como panes, galletas, tortillas, cereales y pastas de salvado o integrales. Avena sin azcar. Quinua. Arroz integral o salvaje. Carnes y otras  protenas Frutos de mar. Carne de ave sin piel. Cortes magros de ave y carne de res. Tofu. Frutos secos. Semillas. Lcteos Productos lcteos sin grasa o con bajo contenido de grasa, como leche, yogur y queso. Es posible que los productos detallados arriba no constituyan una lista completa de los alimentos y las bebidas que puede tomar. Consulte a un nutricionista para obtener ms informacin. Qu alimentos debo evitar? Frutas Frutas enlatadas al almbar. Verduras Verduras enlatadas. Verduras congeladas con mantequilla o salsa de crema. Granos Productos elaborados con harina y harina blanca refinada, como panes, pastas, bocadillos y cereales. Evite todos los alimentos procesados. Carnes y otras protenas Cortes de carne con alto contenido de grasa. Carne de ave con piel. Carnes empanizadas o fritas. Carne procesada. Evite las grasas saturadas. Lcteos Yogur, queso o leche enteros. Bebidas Bebidas azucaradas, como gaseosas o t helado. Es posible que los productos que se enumeran ms arriba no constituyan una lista completa de los alimentos y las bebidas que debe evitar. Consulte a un nutricionista para obtener ms informacin. Preguntas para hacerle al mdico Debo consultar con un especialista certificado en atencin y educacin sobre la diabetes? Es necesario que me rena con un nutricionista? A qu nmero puedo llamar si tengo preguntas? Cules son los mejores momentos para controlar la glucemia? Dnde encontrar ms informacin: American Diabetes Association (Asociacin Estadounidense de la Diabetes): diabetes.org Academy of Nutrition and Dietetics (Academia de Nutricin y Diettica): eatright.org National Institute of Diabetes and Digestive and Kidney Diseases (Instituto Nacional de la Diabetes y las Enfermedades Digestivas y Renales): niddk.nih.gov Association of Diabetes   Care & Education Specialists (Asociacin de Especialistas en Atencin y Educacin sobre la Diabetes):  diabeteseducator.org Resumen Es importante tener hbitos alimenticios saludables debido a que sus niveles de azcar en la sangre (glucosa) se ven afectados en gran medida por lo que come y bebe. Es importante consumir alcohol con prudencia. Un plan de comidas saludable lo ayudar a controlar la glucosa en sangre y a reducir el riesgo de enfermedades cardacas. El mdico puede recomendarle que trabaje con un nutricionista para elaborar el mejor plan para usted. Esta informacin no tiene como fin reemplazar el consejo del mdico. Asegrese de hacerle al mdico cualquier pregunta que tenga. Document Revised: 09/08/2019 Document Reviewed: 09/08/2019 Elsevier Patient Education  2024 Elsevier Inc.  

## 2022-09-19 NOTE — Progress Notes (Signed)
Subjective:  Patient ID: Linda House, female    DOB: [birth year]  Age: 77 y.o. MRN: [id number]  CC: Medical Management of Chronic Issues (No questions or concerns.)   HPI Zakari Kayleen Jacklin is a 77 y.o. year old female with a history of  type 2 diabetes mellitus (A1c 7.5), hypothyroidism, hyperlipidemia, hypertension.   Interval History: Discussed the use of AI scribe software for clinical note transcription with the patient, who gave verbal consent to proceed.   She presents with high blood pressure, knee pain, and vaginal itching. She admits to not taking her antihypertensive medications (amlodipine and lisinopril hydrochlorothiazide) the previous night, which likely explains the elevated blood pressure.  The patient also complains of chronic knee pain, which is worse in both knees. She has been prescribed meloxicam for arthritis pain, but only takes it when the pain is severe. She expresses a desire for stronger pain medication.  For the past month and a half, the patient has been experiencing vaginal itching and an unusual odor, but no abnormal discharge. She has not noticed any abnormal skin changes in the area.  The patient's diabetes is well controlled on Farxiga, glipizide, metformin, and Actos.  A1c 7.5.  She also reports occasional ear pain, and upon examination, wax is found in the right ear.        Past Medical History:  Diagnosis Date   Diabetes mellitus    Diabetes mellitus without complication (HCC)    Hypertension     No past surgical history on file.  Family History  Problem Relation Age of Onset   Diabetes Father    Hypertension Father     Social History   Socioeconomic History   Marital status: Married    Spouse name: Not on file   Number of children: Not on file   Years of education: Not on file   Highest education level: Not on file  Occupational History   Not on file  Tobacco Use   Smoking status: Never   Smokeless  tobacco: Never  Vaping Use   Vaping status: Never Used  Substance and Sexual Activity   Alcohol use: No   Drug use: No   Sexual activity: Never  Other Topics Concern   Not on file  Social History Narrative   ** Merged History Encounter **       Social Determinants of Health   Financial Resource Strain: Not on file  Food Insecurity: Not on file  Transportation Needs: Not on file  Physical Activity: Not on file  Stress: Not on file  Social Connections: Not on file    No Known Allergies  Outpatient Medications Prior to Visit  Medication Sig Dispense Refill   amLODipine (NORVASC) 10 MG tablet Take 1 tablet (10 mg total) by mouth daily. 90 tablet 1   aspirin EC 81 MG tablet Take 1 tablet (81 mg total) by mouth daily. 30 tablet 3   benzonatate (TESSALON) 100 MG capsule Take 2 capsules (200 mg total) by mouth 3 (three) times daily as needed. 40 capsule 0   Blood Glucose Monitoring Suppl (TRUE METRIX METER) w/Device KIT Use to check blood sugar daily. E11.65 1 kit 0   cyclobenzaprine (FLEXERIL) 10 MG tablet TAKE 1 TABLET BY MOUTH AT BEDTIME. 30 tablet 0   dapagliflozin propanediol (FARXIGA) 10 MG TABS tablet Take 1 tablet (10 mg total) by mouth daily before breakfast. 90 tablet 1   diclofenac Sodium (VOLTAREN) 1 % GEL Apply 4 g topically 4 (four)  times daily. 150 g 1   fluticasone (FLONASE) 50 MCG/ACT nasal spray Place 2 sprays into both nostrils daily. 16 g 6   glipiZIDE (GLIPIZIDE XL) 10 MG 24 hr tablet Take 2 tablets (20 mg total) by mouth daily. 60 tablet 6   glucose blood test strip Use as instructed to check blood sugar daily. E11.65 100 each 11   levothyroxine (SYNTHROID) 125 MCG tablet Take 1 tablet (125 mcg total) by mouth daily before breakfast. 90 tablet 1   lisinopril-hydrochlorothiazide (ZESTORETIC) 20-25 MG tablet TAKE 1 TABLET BY MOUTH DAILY. 90 tablet 1   meclizine (ANTIVERT) 25 MG tablet Take 1 tablet (25 mg total) by mouth 3 (three) times daily as needed for  dizziness. 30 tablet 0   meloxicam (MOBIC) 7.5 MG tablet Take 1 tablet (7.5 mg total) by mouth daily. Prn pain 30 tablet 3   olopatadine (PATANOL) 0.1 % ophthalmic solution Place 1 drop into both eyes 2 (two) times daily. 5 mL 1   rosuvastatin (CRESTOR) 40 MG tablet Take 1 tablet (40 mg total) by mouth daily. 90 tablet 1   traZODone (DESYREL) 50 MG tablet Take 0.5-1 tablets (25-50 mg total) by mouth at bedtime as needed for sleep. 30 tablet 3   TRUEplus Lancets 28G MISC Use to check blood sugar daily. E11.65 100 each 11   metFORMIN (GLUCOPHAGE) 1000 MG tablet Take 1 tablet (1,000 mg total) by mouth 2 (two) times daily with a meal. 180 tablet 1   pioglitazone (ACTOS) 45 MG tablet Take 1 tablet (45 mg total) by mouth daily. 90 tablet 1   No facility-administered medications prior to visit.     ROS Review of Systems  Constitutional:  Negative for activity change and appetite change.  HENT:  Positive for ear pain. Negative for sinus pressure and sore throat.   Respiratory:  Negative for chest tightness, shortness of breath and wheezing.   Cardiovascular:  Negative for chest pain and palpitations.  Gastrointestinal:  Negative for abdominal distention, abdominal pain and constipation.  Genitourinary: Negative.   Musculoskeletal:        See HPI  Psychiatric/Behavioral:  Negative for behavioral problems and dysphoric mood.     Objective:  BP (!) 153/73   Pulse 74   Ht 4\' 11"  (1.499 m)   Wt 166 lb 3.2 oz (75.4 kg)   SpO2 95%   BMI 33.57 kg/m      09/19/2022   10:15 AM 09/19/2022    9:54 AM 06/13/2022    9:17 AM  BP/Weight  Systolic BP 153 152 122  Diastolic BP 73 79 76  Wt. (Lbs)  166.2 164.2  BMI  33.57 kg/m2 33.16 kg/m2      Physical Exam Constitutional:      Appearance: She is well-developed.  HENT:     Right Ear: There is impacted cerumen.  Cardiovascular:     Rate and Rhythm: Normal rate.     Heart sounds: Normal heart sounds. No murmur heard. Pulmonary:     Effort:  Pulmonary effort is normal.     Breath sounds: Normal breath sounds. No wheezing or rales.  Chest:     Chest wall: No tenderness.  Abdominal:     General: Bowel sounds are normal. There is no distension.     Palpations: Abdomen is soft. There is no mass.     Tenderness: There is no abdominal tenderness.  Genitourinary:    General: Normal vulva.     Vagina: No vaginal discharge.  Musculoskeletal:  General: Normal range of motion.     Right lower leg: No edema.     Left lower leg: No edema.  Neurological:     Mental Status: She is alert and oriented to person, place, and time.  Psychiatric:        Mood and Affect: Mood normal.        Latest Ref Rng & Units 06/13/2022   10:18 AM 11/29/2021    9:20 AM 09/11/2021    3:53 PM  CMP  Glucose 70 - 99 mg/dL 92  94  350   BUN 8 - 27 mg/dL 10  7  17    Creatinine 0.57 - 1.00 mg/dL 0.93  8.18  2.99   Sodium 134 - 144 mmol/L 139  139  140   Potassium 3.5 - 5.2 mmol/L 4.8  4.7  4.3   Chloride 96 - 106 mmol/L 102  102  102   CO2 20 - 29 mmol/L 23  25  25    Calcium 8.7 - 10.3 mg/dL 9.3  9.0  9.4   Total Protein 6.0 - 8.5 g/dL 7.2  6.7    Total Bilirubin 0.0 - 1.2 mg/dL 0.3  0.2    Alkaline Phos 44 - 121 IU/L 148  143    AST 0 - 40 IU/L 13  17    ALT 0 - 32 IU/L 16  14      Lipid Panel     Component Value Date/Time   CHOL 302 (H) 06/13/2022 1018   TRIG 169 (H) 06/13/2022 1018   HDL 60 06/13/2022 1018   CHOLHDL 3.4 05/24/2020 0918   CHOLHDL 3.4 02/19/2016 1021   VLDL 25 08/29/2015 0849   LDLCALC 210 (H) 06/13/2022 1018    CBC    Component Value Date/Time   WBC 8.8 09/11/2021 1553   RBC 4.80 09/11/2021 1553   HGB 12.7 09/11/2021 1553   HGB 12.2 08/27/2016 0919   HCT 39.2 09/11/2021 1553   HCT 38.2 08/27/2016 0919   PLT 326 09/11/2021 1553   PLT 355 08/27/2016 0919   MCV 81.7 09/11/2021 1553   MCV 79 08/27/2016 0919   MCH 26.5 09/11/2021 1553   MCHC 32.4 09/11/2021 1553   RDW 14.4 09/11/2021 1553   RDW 15.2  08/27/2016 0919   LYMPHSABS 3.0 11/02/2020 2109   LYMPHSABS 2.5 08/27/2016 0919   MONOABS 0.5 11/02/2020 2109   EOSABS 0.2 11/02/2020 2109   EOSABS 0.2 08/27/2016 0919   BASOSABS 0.0 11/02/2020 2109   BASOSABS 0.0 08/27/2016 0919    Lab Results  Component Value Date   HGBA1C 7.5 (A) 09/19/2022    Lab Results  Component Value Date   TSH 3.360 06/13/2022    Assessment & Plan:      Hypertension Elevated blood pressure today due to missed dose of antihypertensive medications (amlodipine and lisinopril hydrochlorothiazide). -Advised to take antihypertensive medications regularly. -Schedule a nurse visit for blood pressure recheck.  Osteoarthritis of the knee Chronic knee pain, intermittent use of meloxicam for pain control. Patient requests stronger pain medication. -Prescribe Tramadol for severe pain episodes.  Vaginal Itching Complaints of vaginal itching and abnormal odor for the past 1.5 months. No visible abnormalities or discharge noted. -Perform pelvic exam and send swab to lab for bacterial and yeast infection testing.  Type 2 diabetes mellitus Well controlled with A1c at 7.5 on Farxiga, glipizide, metformin, and Actos. -Continue current regimen -Counseled on Diabetic diet, my plate method, 371 minutes of moderate intensity exercise/week Blood sugar  logs with fasting goals of 80-120 mg/dl, random of less than 440 and in the event of sugars less than 60 mg/dl or greater than 102 mg/dl encouraged to notify the clinic. Advised on the need for annual eye exams, annual foot exams, Pneumonia vaccine.   Cerumen Impaction Right ear pain due to cerumen impaction. -Recommend over-the-counter ear drops for wax removal.          Meds ordered this encounter  Medications   metFORMIN (GLUCOPHAGE) 1000 MG tablet    Sig: Take 1 tablet (1,000 mg total) by mouth 2 (two) times daily with a meal.    Dispense:  180 tablet    Refill:  1   pioglitazone (ACTOS) 45 MG tablet     Sig: Take 1 tablet (45 mg total) by mouth daily.    Dispense:  90 tablet    Refill:  1   traMADol (ULTRAM) 50 MG tablet    Sig: Take 1 tablet (50 mg total) by mouth at bedtime as needed.    Dispense:  30 tablet    Refill:  1   clotrimazole (LOTRIMIN) 1 % cream    Sig: Apply 1 Application topically 2 (two) times daily.    Dispense:  30 g    Refill:  0    Follow-up: Return in about 2 weeks (around 10/03/2022) for nurse visit BP check, in 3 months, Medical conditions with PCP.       Hoy Register, MD, FAAFP. [facility] [place], [place] [phone]   09/19/2022, 10:40 AM

## 2022-09-20 ENCOUNTER — Other Ambulatory Visit: Payer: Self-pay

## 2022-09-20 ENCOUNTER — Other Ambulatory Visit: Payer: Self-pay | Admitting: Family Medicine

## 2022-09-20 LAB — CERVICOVAGINAL ANCILLARY ONLY
Bacterial Vaginitis (gardnerella): NEGATIVE
Candida Glabrata: POSITIVE — AB
Candida Vaginitis: NEGATIVE
Chlamydia: NEGATIVE
Comment: NEGATIVE
Comment: NEGATIVE
Comment: NEGATIVE
Comment: NEGATIVE
Comment: NEGATIVE
Comment: NORMAL
Neisseria Gonorrhea: NEGATIVE
Trichomonas: NEGATIVE

## 2022-09-20 MED ORDER — FLUCONAZOLE 150 MG PO TABS
150.0000 mg | ORAL_TABLET | Freq: Once | ORAL | 0 refills | Status: AC
  Filled 2022-09-20: qty 1, 1d supply, fill #0

## 2022-09-23 ENCOUNTER — Other Ambulatory Visit: Payer: Self-pay

## 2022-09-24 ENCOUNTER — Other Ambulatory Visit: Payer: Self-pay

## 2022-10-03 ENCOUNTER — Ambulatory Visit: Payer: Self-pay

## 2022-11-25 ENCOUNTER — Other Ambulatory Visit: Payer: Self-pay

## 2022-12-19 ENCOUNTER — Ambulatory Visit: Payer: Self-pay | Attending: Family Medicine | Admitting: Family Medicine

## 2022-12-19 ENCOUNTER — Encounter: Payer: Self-pay | Admitting: Family Medicine

## 2022-12-19 ENCOUNTER — Other Ambulatory Visit: Payer: Self-pay

## 2022-12-19 VITALS — BP 124/67 | HR 76 | Ht 59.0 in | Wt 162.0 lb

## 2022-12-19 DIAGNOSIS — Z7984 Long term (current) use of oral hypoglycemic drugs: Secondary | ICD-10-CM

## 2022-12-19 DIAGNOSIS — E1165 Type 2 diabetes mellitus with hyperglycemia: Secondary | ICD-10-CM

## 2022-12-19 DIAGNOSIS — E785 Hyperlipidemia, unspecified: Secondary | ICD-10-CM

## 2022-12-19 DIAGNOSIS — Z794 Long term (current) use of insulin: Secondary | ICD-10-CM

## 2022-12-19 DIAGNOSIS — J069 Acute upper respiratory infection, unspecified: Secondary | ICD-10-CM

## 2022-12-19 DIAGNOSIS — I10 Essential (primary) hypertension: Secondary | ICD-10-CM

## 2022-12-19 DIAGNOSIS — E039 Hypothyroidism, unspecified: Secondary | ICD-10-CM

## 2022-12-19 DIAGNOSIS — R0981 Nasal congestion: Secondary | ICD-10-CM

## 2022-12-19 LAB — POCT GLYCOSYLATED HEMOGLOBIN (HGB A1C): HbA1c, POC (controlled diabetic range): 7.6 % — AB (ref 0.0–7.0)

## 2022-12-19 MED ORDER — PIOGLITAZONE HCL 45 MG PO TABS
45.0000 mg | ORAL_TABLET | Freq: Every day | ORAL | 1 refills | Status: DC
  Filled 2022-12-19: qty 90, 90d supply, fill #0
  Filled 2023-03-31: qty 90, 90d supply, fill #1

## 2022-12-19 MED ORDER — DAPAGLIFLOZIN PROPANEDIOL 10 MG PO TABS
10.0000 mg | ORAL_TABLET | Freq: Every day | ORAL | 1 refills | Status: AC
Start: 2022-12-19 — End: ?
  Filled 2022-12-19: qty 30, 30d supply, fill #0
  Filled 2022-12-19: qty 90, 90d supply, fill #0

## 2022-12-19 MED ORDER — LISINOPRIL-HYDROCHLOROTHIAZIDE 20-25 MG PO TABS
1.0000 | ORAL_TABLET | Freq: Every day | ORAL | 1 refills | Status: DC
  Filled 2022-12-19: qty 90, 90d supply, fill #0
  Filled 2023-03-31 (×2): qty 90, 90d supply, fill #1

## 2022-12-19 MED ORDER — AMLODIPINE BESYLATE 10 MG PO TABS
10.0000 mg | ORAL_TABLET | Freq: Every day | ORAL | 1 refills | Status: AC
Start: 2022-12-19 — End: ?
  Filled 2022-12-19: qty 90, 90d supply, fill #0
  Filled 2023-03-31 (×2): qty 90, 90d supply, fill #1

## 2022-12-19 MED ORDER — ROSUVASTATIN CALCIUM 40 MG PO TABS
40.0000 mg | ORAL_TABLET | Freq: Every day | ORAL | 1 refills | Status: DC
  Filled 2022-12-19: qty 90, 90d supply, fill #0
  Filled 2023-01-17: qty 90, 90d supply, fill #1

## 2022-12-19 MED ORDER — BENZONATATE 100 MG PO CAPS
100.0000 mg | ORAL_CAPSULE | Freq: Three times a day (TID) | ORAL | 0 refills | Status: DC | PRN
  Filled 2022-12-19: qty 30, 10d supply, fill #0

## 2022-12-19 MED ORDER — METFORMIN HCL 1000 MG PO TABS
1000.0000 mg | ORAL_TABLET | Freq: Two times a day (BID) | ORAL | 1 refills | Status: DC
  Filled 2022-12-19: qty 180, 90d supply, fill #0
  Filled 2023-03-31 (×2): qty 180, 90d supply, fill #1

## 2022-12-19 MED ORDER — GLIPIZIDE ER 10 MG PO TB24
20.0000 mg | ORAL_TABLET | Freq: Every day | ORAL | 1 refills | Status: DC
  Filled 2022-12-19: qty 180, 90d supply, fill #0
  Filled 2023-03-31 (×2): qty 180, 90d supply, fill #1

## 2022-12-19 NOTE — Patient Instructions (Signed)
Infeccin de las vas respiratorias superiores en adultos Upper Respiratory Infection, Adult Una infeccin de las vas respiratorias superiores (IVRS) es una infeccin viral comn de la [place], garganta y de las vas respiratorias superiores que conducen el aire a los pulmones. El tipo ms comn de IVRS es el resfro comn. Las IVRS generalmente mejoran solas, sin tratamiento mdico. Cules son las causas? La causa es un virus. Se puede contagiar este virus: Al aspirar las gotitas que una persona infectada elimina al toser o Engineering geologist. Tocar algo que estuvo expuesto al virus (est contaminado) y luego tocarse la boca, la nariz o los ojos. Qu incrementa el riesgo? Es ms propenso a Health and safety inspector IVRS si: Es muy pequeo o de edad muy [place]. Tiene contacto cercano con otros, como en el [place], la escuela o un centro de atencin mdica. Fuma. Tiene una enfermedad cardaca o pulmonar a largo plazo (crnica). Tiene debilitado el sistema encargado de combatir las enfermedades (sistema inmunitario). Tiene asma o alergias nasales. Est sufriendo mucho estrs. Tiene un dficit nutricional. Cules son los signos o sntomas? La IVRS suele presentar alguno de los siguientes sntomas: Secrecin nasal o nariz tapada (congestin). Tos. Estornudos. Dolor de Advertising copywriter. Dolor de [place]. Fatiga. Grant Ruts. Prdida del apetito. Dolor en la frente, detrs de los ojos y por encima de los pmulos (dolor sinusal). Dolores musculares. Enrojecimiento o irritacin de los ojos. Presin en los odos o la cara. Cmo se diagnostica? Esta afeccin se puede diagnosticar en funcin de los antecedentes mdicos, los sntomas y un examen fsico. El mdico puede usar un hisopo para tomar una muestra de mucosidad de la nariz (hisopado nasal). Esta muestra puede analizarse para determinar qu virus est provocando la enfermedad. Cmo se trata? Las IVRS generalmente mejoran por s solas en un perodo de entre 7 y [address] Los medicamentos no curan las IVRS, Biomedical engineer el mdico puede recomendarle ciertos medicamentos para ayudar a [employer], como por ejemplo: Medicamentos para la tos de [place]. Antitusivos. Toser es un tipo de defensa contra una infeccin que ayuda a limpiar el sistema respiratorio, de modo que tome estos medicamentos solo segn se lo recomiende el mdico. Medicamentos para bajar la fiebre. Siga estas instrucciones en su casa: Actividad Descanse todo lo que sea necesario. Si tiene fiebre, qudese en su casa, es decir, no vaya al trabajo o la escuela, hasta que no tenga fiebre o hasta que el mdico le diga que la IVRS ya no se puede diseminar a Economist (ya no [place]). El mdico puede pedirle que use una mscara facial para evitar que disemine la infeccin. Para aliviar los sntomas [place] grgaras con una mezcla de agua y sal 3 o 4 veces al da, o segn sea necesario. Para preparar agua con sal, disuelva totalmente de  a 1 cucharadita (de 3 a 6 g) de sal en 1 taza (237 ml) de agua tibia. Use un humidificador de aire fro para agregar humedad al aire. Esto puede ayudarlo a que respire mejor. Comida y bebida  Beba suficiente lquido como para Pharmacologist la orina de color amarillo plido. Tome sopas y caldos transparentes. Instrucciones generales  Use los medicamentos de venta libre y los recetados solamente como se lo haya indicado el mdico. Estos incluyen medicamentos para el resfro, para bajar la fiebre y antitusivos. No consuma ningn producto que contenga nicotina o tabaco. Estos productos incluyen cigarrillos, tabaco para Theatre manager y aparatos de vapeo, como los Administrator, Civil Service. Si necesita ayuda para dejar de fumar, consulte al  mdico. Aljese del humo de otros fumadores. Mantngase al da con todas las vacunas, incluso la vacuna anual (una vez al ao) contra la gripe. Concurra a todas las visitas de seguimiento. Esto es importante. Cmo evitar contagiar la  infeccin a otros Las IVRS pueden ser [employer]. Para evitar que la infeccin se propague, tome las siguientes medidas: Lvese las manos con agua y jabn durante al menos 20 segundos. Use desinfectante para manos si no dispone de [place] y [place]. Evite tocarse la boca, la cara, los ojos o la [place]. Tosa o estornude en un pauelo de papel o en su manga o codo en lugar de hacerlo en la mano o en el aire.  Comunquese con un mdico si: Empeora en lugar de mejorar. Tiene fiebre o escalofros. Tiene mucosidad marrn o roja. Tiene una secrecin amarilla o marrn de la Clinical cytogeneticist. Le duele la cara, especialmente al inclinarse hacia adelante. Tiene los ganglios del cuello hinchados. Siente dolor al tragar. Tiene zonas blancas en la parte de atrs de la garganta. Solicite ayuda de inmediato si: La falta de aire empeora. Tiene sntomas intensos o persistentes de: Dolor de [place]. Dolor de odo. Dolor sinusal. Dolor de pecho. Tiene enfermedad pulmonar crnica junto con cualquiera de estos sntomas: Emitir sonidos de silbidos agudos al respirar, ms a menudo al exhalar (sibilancias). Tos prolongada (ms de [address]). Tos con sangre. Cambio en la mucosidad habitual. Tiene rigidez en el cuello. Tiene cambios en: La visin. La audicin. El razonamiento. El [place] de nimo. Estos sntomas pueden Customer service manager. Solicite ayuda de inmediato. Llame al 911. No espere a ver si los sntomas desaparecen. No conduzca por sus propios medios [employer]. Resumen Una infeccin de las vas respiratorias superiores (IVRS) es una infeccin comn de la nariz, la garganta y las vas respiratorias superiores que conducen el aire a los pulmones. La causa es un virus. Las IVRS generalmente mejoran por s solas en un perodo de entre 7 y [address]. Los medicamentos no curan las IVRS, pero el mdico puede recomendarle ciertos medicamentos para ayudar a [employer]. Esta informacin no tiene Public house manager el consejo del mdico. Asegrese de hacerle al mdico cualquier pregunta que tenga. Document Revised: 08/28/2020 Document Reviewed: 08/28/2020 Elsevier Patient Education  2024 [employer].

## 2022-12-19 NOTE — Progress Notes (Signed)
Subjective:  Patient ID: Linda House, female    DOB: 10-Dec-1945  Age: 77 y.o. MRN: [id number]  CC: Medical Management of Chronic Issues   HPI Linda House is a 77 y.o. year old female with a history of type 2 diabetes mellitus (A1c 7.6), hypothyroidism, hyperlipidemia, hypertension.   Interval History: Discussed the use of AI scribe software for clinical note transcription with the patient, who gave verbal consent to proceed.   She presents with a cough and cold for the last 4 days. She denies fever and body aches. She has not taken any medications for these symptoms and denies history of sick contacts.  With regards to her diabetes she endorses adherence with her medications with no complaints of hypoglycemia.  Also doing well on her antihypertensive and her levothyroxine for hypothyroidism. She remains on statin.    Past Medical History:  Diagnosis Date   Diabetes mellitus    Diabetes mellitus without complication (HCC)    Hypertension     No past surgical history on file.  Family History  Problem Relation Age of Onset   Diabetes Father    Hypertension Father     Social History   Socioeconomic History   Marital status: Married    Spouse name: Not on file   Number of children: Not on file   Years of education: Not on file   Highest education level: Not on file  Occupational History   Not on file  Tobacco Use   Smoking status: Never   Smokeless tobacco: Never  Vaping Use   Vaping status: Never Used  Substance and Sexual Activity   Alcohol use: No   Drug use: No   Sexual activity: Never  Other Topics Concern   Not on file  Social History Narrative   ** Merged History Encounter **       Social Determinants of Health   Financial Resource Strain: Not on file  Food Insecurity: Not on file  Transportation Needs: Not on file  Physical Activity: Not on file  Stress: Not on file  Social Connections: Not on file    No Known  Allergies  Outpatient Medications Prior to Visit  Medication Sig Dispense Refill   aspirin EC 81 MG tablet Take 1 tablet (81 mg total) by mouth daily. 30 tablet 3   Blood Glucose Monitoring Suppl (TRUE METRIX METER) w/Device KIT Use to check blood sugar daily. E11.65 1 kit 0   clotrimazole (LOTRIMIN) 1 % cream Apply 1 Application topically 2 (two) times daily. 30 g 0   diclofenac Sodium (VOLTAREN) 1 % GEL Apply 4 g topically 4 (four) times daily. 150 g 1   fluticasone (FLONASE) 50 MCG/ACT nasal spray Place 2 sprays into both nostrils daily. 16 g 6   glucose blood test strip Use as instructed to check blood sugar daily. E11.65 100 each 11   levothyroxine (SYNTHROID) 125 MCG tablet Take 1 tablet (125 mcg total) by mouth daily before breakfast. 90 tablet 1   meloxicam (MOBIC) 7.5 MG tablet Take 1 tablet (7.5 mg total) by mouth daily. Prn pain 30 tablet 3   olopatadine (PATANOL) 0.1 % ophthalmic solution Place 1 drop into both eyes 2 (two) times daily. 5 mL 1   traMADol (ULTRAM) 50 MG tablet Take 1 tablet (50 mg total) by mouth at bedtime as needed. 30 tablet 1   traZODone (DESYREL) 50 MG tablet Take 0.5-1 tablets (25-50 mg total) by mouth at bedtime as needed for sleep. 30  tablet 3   TRUEplus Lancets 28G MISC Use to check blood sugar daily. E11.65 100 each 11   amLODipine (NORVASC) 10 MG tablet Take 1 tablet (10 mg total) by mouth daily. 90 tablet 1   benzonatate (TESSALON) 100 MG capsule Take 2 capsules (200 mg total) by mouth 3 (three) times daily as needed. 40 capsule 0   cyclobenzaprine (FLEXERIL) 10 MG tablet TAKE 1 TABLET BY MOUTH AT BEDTIME. 30 tablet 0   dapagliflozin propanediol (FARXIGA) 10 MG TABS tablet Take 1 tablet (10 mg total) by mouth daily before breakfast. 90 tablet 1   glipiZIDE (GLIPIZIDE XL) 10 MG 24 hr tablet Take 2 tablets (20 mg total) by mouth daily. 60 tablet 6   lisinopril-hydrochlorothiazide (ZESTORETIC) 20-25 MG tablet TAKE 1 TABLET BY MOUTH DAILY. 90 tablet 1    meclizine (ANTIVERT) 25 MG tablet Take 1 tablet (25 mg total) by mouth 3 (three) times daily as needed for dizziness. 30 tablet 0   metFORMIN (GLUCOPHAGE) 1000 MG tablet Take 1 tablet (1,000 mg total) by mouth 2 (two) times daily with a meal. 180 tablet 1   pioglitazone (ACTOS) 45 MG tablet Take 1 tablet (45 mg total) by mouth daily. 90 tablet 1   rosuvastatin (CRESTOR) 40 MG tablet Take 1 tablet (40 mg total) by mouth daily. 90 tablet 1   No facility-administered medications prior to visit.     ROS Review of Systems  Constitutional:  Negative for activity change and appetite change.  HENT:  Positive for congestion. Negative for sinus pressure and sore throat.   Respiratory:  Positive for cough. Negative for chest tightness, shortness of breath and wheezing.   Cardiovascular:  Negative for chest pain and palpitations.  Gastrointestinal:  Negative for abdominal distention, abdominal pain and constipation.  Genitourinary: Negative.   Musculoskeletal: Negative.   Psychiatric/Behavioral:  Negative for behavioral problems and dysphoric mood.     Objective:  BP 124/67   Pulse 76   Ht 4\' 11"  (1.499 m)   Wt 162 lb (73.5 kg)   SpO2 97%   BMI 32.72 kg/m      12/19/2022    9:44 AM 09/19/2022   10:15 AM 09/19/2022    9:54 AM  BP/Weight  Systolic BP 124 153 152  Diastolic BP 67 73 79  Wt. (Lbs) 162  166.2  BMI 32.72 kg/m2  33.57 kg/m2      Physical Exam Constitutional:      Appearance: She is well-developed.  HENT:     Right Ear: Tympanic membrane normal.     Left Ear: Tympanic membrane normal.     Mouth/Throat:     Mouth: Mucous membranes are moist.  Cardiovascular:     Rate and Rhythm: Normal rate.     Heart sounds: Normal heart sounds. No murmur heard. Pulmonary:     Effort: Pulmonary effort is normal.     Breath sounds: Normal breath sounds. No wheezing or rales.  Chest:     Chest wall: No tenderness.  Abdominal:     General: Bowel sounds are normal. There is no  distension.     Palpations: Abdomen is soft. There is no mass.     Tenderness: There is no abdominal tenderness.  Musculoskeletal:        General: Normal range of motion.     Right lower leg: No edema.     Left lower leg: No edema.  Neurological:     Mental Status: She is alert and oriented to person, place,  and time.  Psychiatric:        Mood and Affect: Mood normal.        Latest Ref Rng & Units 06/13/2022   10:18 AM 11/29/2021    9:20 AM 09/11/2021    3:53 PM  CMP  Glucose 70 - 99 mg/dL 92  94  952   BUN 8 - 27 mg/dL 10  7  17    Creatinine 0.57 - 1.00 mg/dL 8.41  3.24  4.01   Sodium 134 - 144 mmol/L 139  139  140   Potassium 3.5 - 5.2 mmol/L 4.8  4.7  4.3   Chloride 96 - 106 mmol/L 102  102  102   CO2 20 - 29 mmol/L 23  25  25    Calcium 8.7 - 10.3 mg/dL 9.3  9.0  9.4   Total Protein 6.0 - 8.5 g/dL 7.2  6.7    Total Bilirubin 0.0 - 1.2 mg/dL 0.3  0.2    Alkaline Phos 44 - 121 IU/L 148  143    AST 0 - 40 IU/L 13  17    ALT 0 - 32 IU/L 16  14      Lipid Panel     Component Value Date/Time   CHOL 302 (H) 06/13/2022 1018   TRIG 169 (H) 06/13/2022 1018   HDL 60 06/13/2022 1018   CHOLHDL 3.4 05/24/2020 0918   CHOLHDL 3.4 02/19/2016 1021   VLDL 25 08/29/2015 0849   LDLCALC 210 (H) 06/13/2022 1018    CBC    Component Value Date/Time   WBC 8.8 09/11/2021 1553   RBC 4.80 09/11/2021 1553   HGB 12.7 09/11/2021 1553   HGB 12.2 08/27/2016 0919   HCT 39.2 09/11/2021 1553   HCT 38.2 08/27/2016 0919   PLT 326 09/11/2021 1553   PLT 355 08/27/2016 0919   MCV 81.7 09/11/2021 1553   MCV 79 08/27/2016 0919   MCH 26.5 09/11/2021 1553   MCHC 32.4 09/11/2021 1553   RDW 14.4 09/11/2021 1553   RDW 15.2 08/27/2016 0919   LYMPHSABS 3.0 11/02/2020 2109   LYMPHSABS 2.5 08/27/2016 0919   MONOABS 0.5 11/02/2020 2109   EOSABS 0.2 11/02/2020 2109   EOSABS 0.2 08/27/2016 0919   BASOSABS 0.0 11/02/2020 2109   BASOSABS 0.0 08/27/2016 0919    Lab Results  Component Value Date    HGBA1C 7.6 (A) 12/19/2022    Assessment & Plan:      Upper Respiratory Infection Cough and congestion, no fever. Likely viral etiology. -Advise warm fluids and Tylenol for symptom management. -Prescribe Tessalon Perles  Hyperlipidemia Patient taking prescribed Rosuvastatin. -Reinforce importance of taking Rosuvastatin for cholesterol management. -Low-cholesterol diet   Type II Diabetes Mellitus A1c 7.6, controlled on current regimen. -Continue current antihyperglycemic medications. -Counseled on Diabetic diet, my plate method, 027 minutes of moderate intensity exercise/week Blood sugar logs with fasting goals of 80-120 mg/dl, random of less than 253 and in the event of sugars less than 60 mg/dl or greater than 664 mg/dl encouraged to notify the clinic. Advised on the need for annual eye exams, annual foot exams, Pneumonia vaccine.   Hypertension Blood pressure well controlled. -Continue current antihypertensive medications. -Counseled on blood pressure goal of less than 130/80, low-sodium, DASH diet, medication compliance, 150 minutes of moderate intensity exercise per week. Discussed medication compliance, adverse effects.   Hypothyroidism No changes reported. -Continue current thyroid replacement therapy. -Will check thyroid panel and adjust regimen if indicated  Medication Reconciliation Patient reported not taking  several medications listed in chart including those for arthritis and vertigo. -Remove unnecessary medications from chart per patient report.  General Health Maintenance -Order blood work to check cholesterol, kidney and liver function.          Meds ordered this encounter  Medications   benzonatate (TESSALON) 100 MG capsule    Sig: Take 1 capsule (100 mg total) by mouth 3 (three) times daily as needed.    Dispense:  30 capsule    Refill:  0   amLODipine (NORVASC) 10 MG tablet    Sig: Take 1 tablet (10 mg total) by mouth daily.    Dispense:  90  tablet    Refill:  1   dapagliflozin propanediol (FARXIGA) 10 MG TABS tablet    Sig: Take 1 tablet (10 mg total) by mouth daily before breakfast.    Dispense:  90 tablet    Refill:  1    Dose increase   glipiZIDE (GLIPIZIDE XL) 10 MG 24 hr tablet    Sig: Take 2 tablets (20 mg total) by mouth daily.    Dispense:  180 tablet    Refill:  1   lisinopril-hydrochlorothiazide (ZESTORETIC) 20-25 MG tablet    Sig: TAKE 1 TABLET BY MOUTH DAILY.    Dispense:  90 tablet    Refill:  1   pioglitazone (ACTOS) 45 MG tablet    Sig: Take 1 tablet (45 mg total) by mouth daily.    Dispense:  90 tablet    Refill:  1   rosuvastatin (CRESTOR) 40 MG tablet    Sig: Take 1 tablet (40 mg total) by mouth daily.    Dispense:  90 tablet    Refill:  1   metFORMIN (GLUCOPHAGE) 1000 MG tablet    Sig: Take 1 tablet (1,000 mg total) by mouth 2 (two) times daily with a meal.    Dispense:  180 tablet    Refill:  1    Follow-up: Return in about 6 months (around 06/19/2023) for Chronic medical conditions.       Hoy Register, MD, FAAFP. [facility] [place], [place] [phone]   12/19/2022, 3:02 PM

## 2022-12-20 ENCOUNTER — Other Ambulatory Visit: Payer: Self-pay | Admitting: Family Medicine

## 2022-12-20 ENCOUNTER — Other Ambulatory Visit: Payer: Self-pay

## 2022-12-20 DIAGNOSIS — E039 Hypothyroidism, unspecified: Secondary | ICD-10-CM

## 2022-12-20 DIAGNOSIS — E785 Hyperlipidemia, unspecified: Secondary | ICD-10-CM

## 2022-12-20 DIAGNOSIS — Z7984 Long term (current) use of oral hypoglycemic drugs: Secondary | ICD-10-CM

## 2022-12-20 LAB — CMP14+EGFR
ALT: 10 [IU]/L (ref 0–32)
AST: 13 [IU]/L (ref 0–40)
Albumin: 4.1 g/dL (ref 3.8–4.8)
Alkaline Phosphatase: 153 [IU]/L — ABNORMAL HIGH (ref 44–121)
BUN/Creatinine Ratio: 14 (ref 12–28)
BUN: 11 mg/dL (ref 8–27)
Bilirubin Total: 0.3 mg/dL (ref 0.0–1.2)
CO2: 26 mmol/L (ref 20–29)
Calcium: 9.7 mg/dL (ref 8.7–10.3)
Chloride: 101 mmol/L (ref 96–106)
Creatinine, Ser: 0.77 mg/dL (ref 0.57–1.00)
Globulin, Total: 3.1 g/dL (ref 1.5–4.5)
Glucose: 102 mg/dL — ABNORMAL HIGH (ref 70–99)
Potassium: 4.6 mmol/L (ref 3.5–5.2)
Sodium: 140 mmol/L (ref 134–144)
Total Protein: 7.2 g/dL (ref 6.0–8.5)
eGFR: 80 mL/min/{1.73_m2} (ref >59)

## 2022-12-20 LAB — LP+NON-HDL CHOLESTEROL
Cholesterol, Total: 261 mg/dL — ABNORMAL HIGH (ref 100–199)
HDL: 61 mg/dL (ref >39)
LDL Chol Calc (NIH): 174 mg/dL — ABNORMAL HIGH (ref 0–99)
Total Non-HDL-Chol (LDL+VLDL): 200 mg/dL — ABNORMAL HIGH (ref 0–129)
Triglycerides: 142 mg/dL (ref 0–149)
VLDL Cholesterol Cal: 26 mg/dL (ref 5–40)

## 2022-12-20 LAB — TSH: TSH: 2.03 u[IU]/mL (ref 0.450–4.500)

## 2022-12-20 LAB — T3: T3, Total: 115 ng/dL (ref 71–180)

## 2022-12-20 LAB — T4, FREE: Free T4: 1.65 ng/dL (ref 0.82–1.77)

## 2022-12-20 MED ORDER — LEVOTHYROXINE SODIUM 125 MCG PO TABS
125.0000 ug | ORAL_TABLET | Freq: Every day | ORAL | 1 refills | Status: DC
  Filled 2022-12-20 – 2023-03-31 (×2): qty 90, 90d supply, fill #0

## 2022-12-20 MED ORDER — EZETIMIBE 10 MG PO TABS
10.0000 mg | ORAL_TABLET | Freq: Every day | ORAL | 3 refills | Status: DC
  Filled 2022-12-20 – 2023-03-31 (×2): qty 90, 90d supply, fill #0

## 2022-12-27 ENCOUNTER — Other Ambulatory Visit: Payer: Self-pay

## 2022-12-31 ENCOUNTER — Other Ambulatory Visit: Payer: Self-pay

## 2023-01-02 ENCOUNTER — Telehealth: Payer: Self-pay | Admitting: *Deleted

## 2023-01-02 NOTE — Telephone Encounter (Signed)
  Chief Complaint: Results Symptoms: NA Frequency: NA Pertinent Negatives: Patient denies NA Disposition: [] ED /[] Urgent Care (no appt availability in office) / [] Appointment(In office/virtual)/ []  [facility]/ [] Home Care/ [] Refused Recommended Disposition /[] [facility]/ []  Follow-up with PCP Additional Notes:   Pt called for results, verbalizes understanding.  Assisted by Madelaine Etienne # [phone]   Please inform her that her kidney function is normal, thyroid labs are normal but cholesterol is elevated.  I added a prescription called Zetia to her medications and referred her to the cardiology lipid clinic for additional management.  Please encouraged to work on a low-cholesterol diet.  Other labs are stable.

## 2023-01-06 ENCOUNTER — Other Ambulatory Visit: Payer: Self-pay

## 2023-01-10 ENCOUNTER — Other Ambulatory Visit: Payer: Self-pay

## 2023-01-17 ENCOUNTER — Other Ambulatory Visit: Payer: Self-pay

## 2023-01-21 ENCOUNTER — Other Ambulatory Visit: Payer: Self-pay

## 2023-03-07 ENCOUNTER — Other Ambulatory Visit: Payer: Self-pay

## 2023-03-28 ENCOUNTER — Telehealth: Payer: Self-pay

## 2023-03-28 ENCOUNTER — Other Ambulatory Visit: Payer: Self-pay

## 2023-03-28 NOTE — Telephone Encounter (Signed)
 90 DAY SUPPLY OF FARXIGA 10MG  PAP SHIPPED 03/27/2023 BY [employer] TO [address], [place], [address]. TRACKING: [employer]  [phone]. PROGRAM # [phone].  SHIPMENT NOTIFICATION UPLOADED TO MEDIA.

## 2023-03-31 ENCOUNTER — Other Ambulatory Visit: Payer: Self-pay | Admitting: Family Medicine

## 2023-03-31 ENCOUNTER — Other Ambulatory Visit: Payer: Self-pay

## 2023-03-31 DIAGNOSIS — E785 Hyperlipidemia, unspecified: Secondary | ICD-10-CM

## 2023-03-31 MED ORDER — ROSUVASTATIN CALCIUM 40 MG PO TABS
40.0000 mg | ORAL_TABLET | Freq: Every day | ORAL | 1 refills | Status: DC
  Filled 2023-03-31 – 2023-06-12 (×2): qty 90, 90d supply, fill #0

## 2023-04-01 ENCOUNTER — Other Ambulatory Visit: Payer: Self-pay

## 2023-06-12 ENCOUNTER — Other Ambulatory Visit: Payer: Self-pay | Admitting: Family Medicine

## 2023-06-12 ENCOUNTER — Other Ambulatory Visit: Payer: Self-pay

## 2023-06-12 ENCOUNTER — Encounter: Payer: Self-pay | Admitting: Pharmacist

## 2023-06-12 DIAGNOSIS — I10 Essential (primary) hypertension: Secondary | ICD-10-CM

## 2023-06-12 DIAGNOSIS — E1165 Type 2 diabetes mellitus with hyperglycemia: Secondary | ICD-10-CM

## 2023-06-13 ENCOUNTER — Other Ambulatory Visit: Payer: Self-pay

## 2023-06-13 ENCOUNTER — Encounter (HOSPITAL_COMMUNITY): Payer: Self-pay

## 2023-06-13 ENCOUNTER — Ambulatory Visit (HOSPITAL_COMMUNITY): Payer: Self-pay | Admitting: Emergency Medicine

## 2023-06-13 ENCOUNTER — Ambulatory Visit (HOSPITAL_COMMUNITY)
Admission: EM | Admit: 2023-06-13 | Discharge: 2023-06-13 | Disposition: A | Discharge status: Home / Self Care | Payer: Self-pay | Attending: Emergency Medicine | Admitting: Emergency Medicine

## 2023-06-13 DIAGNOSIS — R200 Anesthesia of skin: Secondary | ICD-10-CM | POA: Insufficient documentation

## 2023-06-13 DIAGNOSIS — R202 Paresthesia of skin: Secondary | ICD-10-CM | POA: Insufficient documentation

## 2023-06-13 DIAGNOSIS — E1165 Type 2 diabetes mellitus with hyperglycemia: Secondary | ICD-10-CM | POA: Insufficient documentation

## 2023-06-13 DIAGNOSIS — R42 Dizziness and giddiness: Secondary | ICD-10-CM | POA: Insufficient documentation

## 2023-06-13 LAB — COMPREHENSIVE METABOLIC PANEL WITH GFR
ALT: 18 U/L (ref 0–44)
AST: 19 U/L (ref 15–41)
Albumin: 3.9 g/dL (ref 3.5–5.0)
Alkaline Phosphatase: 109 U/L (ref 38–126)
Anion gap: 12 (ref 5–15)
BUN: 17 mg/dL (ref 8–23)
CO2: 27 mmol/L (ref 22–32)
Calcium: 9.6 mg/dL (ref 8.9–10.3)
Chloride: 99 mmol/L (ref 98–111)
Creatinine, Ser: 1.14 mg/dL — ABNORMAL HIGH (ref 0.44–1.00)
GFR, Estimated: 50 mL/min — ABNORMAL LOW (ref >60)
Glucose, Bld: 154 mg/dL — ABNORMAL HIGH (ref 70–99)
Potassium: 4 mmol/L (ref 3.5–5.1)
Sodium: 138 mmol/L (ref 135–145)
Total Bilirubin: 0.4 mg/dL (ref 0.0–1.2)
Total Protein: 7.7 g/dL (ref 6.5–8.1)

## 2023-06-13 LAB — CBC WITH DIFFERENTIAL/PLATELET
Abs Immature Granulocytes: 0.02 10*3/uL (ref 0.00–0.07)
Basophils Absolute: 0.1 10*3/uL (ref 0.0–0.1)
Basophils Relative: 1 %
Eosinophils Absolute: 0.4 10*3/uL (ref 0.0–0.5)
Eosinophils Relative: 5 %
HCT: 45.6 % (ref 36.0–46.0)
Hemoglobin: 14.6 g/dL (ref 12.0–15.0)
Immature Granulocytes: 0 %
Lymphocytes Relative: 27 %
Lymphs Abs: 2.3 10*3/uL (ref 0.7–4.0)
MCH: 25.9 pg — ABNORMAL LOW (ref 26.0–34.0)
MCHC: 32 g/dL (ref 30.0–36.0)
MCV: 80.9 fL (ref 80.0–100.0)
Monocytes Absolute: 0.5 10*3/uL (ref 0.1–1.0)
Monocytes Relative: 6 %
Neutro Abs: 5.4 10*3/uL (ref 1.7–7.7)
Neutrophils Relative %: 61 %
Platelets: 361 10*3/uL (ref 150–400)
RBC: 5.64 MIL/uL — ABNORMAL HIGH (ref 3.87–5.11)
RDW: 14.6 % (ref 11.5–15.5)
WBC: 8.7 10*3/uL (ref 4.0–10.5)
nRBC: 0 % (ref 0.0–0.2)

## 2023-06-13 LAB — POCT FASTING CBG KUC MANUAL ENTRY: POCT Glucose (KUC): 152 mg/dL — AB (ref 70–99)

## 2023-06-13 LAB — VITAMIN B12: Vitamin B-12: 327 pg/mL (ref 180–914)

## 2023-06-13 MED ORDER — VITAMIN B-12 100 MCG PO TABS
100.0000 ug | ORAL_TABLET | Freq: Every day | ORAL | 0 refills | Status: AC
  Filled 2023-06-13: qty 30, 30d supply, fill #0

## 2023-06-13 NOTE — ED Provider Notes (Signed)
 [facility]    CSN: [id number] Arrival date & time: 06/13/23  1449      History   Chief Complaint No chief complaint on file.   HPI Linda House is a 78 y.o. female.   Video Spanish interpretor used for this encounter.   This has been ongoing for the past 20 days intermittently  Sometimes numbness in the face Sometimes will get dizzy   Denies chest pain, denies shortness of breath  A little facial numbness that is rare, changes sides  Left hand numbness then right hand (today tingling is in left hand) Not much tingling in arms, mostly the hands   Around 1 week ago she was grocery shopping with her daughter and complained of dizziness and not feeling well  Had to stop and get a snack, has been ongoing and intermittent   DM2 on oral medications, A1C in December 7.6. Has been on metformin  for around 20 years, does not recall Vitamin B levels being checked.   The history is provided by the patient and medical records. The history is limited by a language barrier. A language interpreter was used.    Past Medical History:  Diagnosis Date   Diabetes mellitus    Diabetes mellitus without complication (HCC)    Hypertension     Patient Active Problem List   Diagnosis Date Noted   Dizziness 09/11/2013   Nausea 09/11/2013   Primary osteoarthritis of left knee 07/26/2013   Diabetes mellitus without complication (HCC) 07/26/2013   Vaginal discharge 04/26/2013   Diabetes (HCC) 11/19/2012   Essential hypertension, benign 11/19/2012   Dyslipidemia 11/19/2012   Osteoporosis 11/19/2012   ACUTE GINGIVITIS PLAQUE INDUCED 10/16/2009   PLANTAR FASCIITIS, RIGHT 07/03/2009   ABDOMINAL BRUIT 03/28/2009   URINALYSIS, ABNORMAL 03/28/2009   MICROALBUMINURIA 03/21/2009   VARICOSE VEINS, LOWER EXTREMITIES 03/20/2009   ABSCESS, SKIN 03/20/2009   MUSCLE STRAIN, HAMSTRING MUSCLE 11/30/2007   Disorder of bone and cartilage 06/22/2007   Cystitis 01/26/2007    HEMATURIA UNSPECIFIED 01/26/2007   Hypothyroidism 07/23/2006   DIABETES MELLITUS, TYPE II 07/23/2006   Diabetic radiculopathy (HCC) 07/23/2006   HYPERLIPIDEMIA 07/23/2006   Essential hypertension 07/23/2006   CEREBROVASCULAR DISEASE 07/23/2006   MEDIAL EPICONDYLITIS, LEFT 07/23/2006   History of cardiovascular disorder 07/23/2006    History reviewed. No pertinent surgical history.  OB History   No obstetric history on file.      Home Medications    Prior to Admission medications   Medication Sig Start Date End Date Taking? Authorizing Provider  vitamin B-12 (CYANOCOBALAMIN) 100 MCG tablet Take 1 tablet (100 mcg total) by mouth daily. 06/13/23  Yes Aadarsh Cozort  N, FNP  amLODipine  (NORVASC ) 10 MG tablet Take 1 tablet (10 mg total) by mouth daily. 12/19/22   Newlin, Enobong, MD  aspirin  EC 81 MG tablet Take 1 tablet (81 mg total) by mouth daily. 08/11/15   Newlin, Enobong, MD  benzonatate  (TESSALON ) 100 MG capsule Take 1 capsule (100 mg total) by mouth 3 (three) times daily as needed. 12/19/22   Newlin, Enobong, MD  Blood Glucose Monitoring Suppl (TRUE METRIX METER) w/Device KIT Use to check blood sugar daily. E11.65 02/11/19   Newlin, Enobong, MD  clotrimazole  (LOTRIMIN ) 1 % cream Apply 1 Application topically 2 (two) times daily. 09/19/22   Newlin, Enobong, MD  dapagliflozin  propanediol (FARXIGA ) 10 MG TABS tablet Take 1 tablet (10 mg total) by mouth daily before breakfast. 12/19/22   Newlin, Enobong, MD  diclofenac  Sodium (VOLTAREN ) 1 %  GEL Apply 4 g topically 4 (four) times daily. 03/13/22   Hassie Lint, PA-C  ezetimibe  (ZETIA ) 10 MG tablet Take 1 tablet (10 mg total) by mouth daily. 12/20/22   Newlin, Enobong, MD  fluticasone  (FLONASE ) 50 MCG/ACT nasal spray Place 2 sprays into both nostrils daily. 03/13/22   Hassie Lint, PA-C  glipiZIDE  (GLIPIZIDE  XL) 10 MG 24 hr tablet Take 2 tablets (20 mg total) by mouth daily. 12/19/22   Newlin, Enobong, MD  glucose blood test strip Use  as instructed to check blood sugar daily. E11.65 02/11/19   Newlin, Enobong, MD  levothyroxine  (SYNTHROID ) 125 MCG tablet Take 1 tablet (125 mcg total) by mouth daily before breakfast. 12/20/22   Newlin, Enobong, MD  lisinopril -hydrochlorothiazide  (ZESTORETIC ) 20-25 MG tablet TAKE 1 TABLET BY MOUTH DAILY. 12/19/22   Newlin, Enobong, MD  meloxicam  (MOBIC ) 7.5 MG tablet Take 1 tablet (7.5 mg total) by mouth daily. Prn pain 03/13/22   Hassie Lint, PA-C  metFORMIN  (GLUCOPHAGE ) 1000 MG tablet Take 1 tablet (1,000 mg total) by mouth 2 (two) times daily with a meal. 12/19/22   Newlin, Lavelle Posey, MD  olopatadine  (PATANOL) 0.1 % ophthalmic solution Place 1 drop into both eyes 2 (two) times daily. 11/21/20   Newlin, Enobong, MD  pioglitazone  (ACTOS ) 45 MG tablet Take 1 tablet (45 mg total) by mouth daily. 12/19/22   Newlin, Enobong, MD  rosuvastatin  (CRESTOR ) 40 MG tablet Take 1 tablet (40 mg total) by mouth daily. 03/31/23   Newlin, Enobong, MD  traMADol  (ULTRAM ) 50 MG tablet Take 1 tablet (50 mg total) by mouth at bedtime as needed. 09/19/22   Newlin, Enobong, MD  traZODone  (DESYREL ) 50 MG tablet Take 0.5-1 tablets (25-50 mg total) by mouth at bedtime as needed for sleep. 03/13/22   Hassie Lint, PA-C  TRUEplus Lancets 28G MISC Use to check blood sugar daily. E11.65 02/11/19   Joaquin Mulberry, MD  Calcium  500-125 MG-UNIT TABS Take 1 tablet by mouth daily. Patient not taking: Reported on 12/31/2013 11/19/12 05/30/14  Jegede, Olugbemiga E, MD    Family History Family History  Problem Relation Age of Onset   Diabetes Father    Hypertension Father     Social History Social History   Tobacco Use   Smoking status: Never   Smokeless tobacco: Never  Vaping Use   Vaping status: Never Used  Substance Use Topics   Alcohol use: No   Drug use: No     Allergies   Patient has no known allergies.   Review of Systems Review of Systems  Per HPI  Physical Exam Triage Vital Signs ED Triage Vitals   Encounter Vitals Group     BP 06/13/23 1506 (!) 158/73     Systolic BP Percentile --      Diastolic BP Percentile --      Pulse Rate 06/13/23 1506 88     Resp 06/13/23 1506 18     Temp 06/13/23 1506 99 F (37.2 C)     Temp Source 06/13/23 1506 Oral     SpO2 06/13/23 1506 95 %     Weight --      Height --      Head Circumference --      Peak Flow --      Pain Score 06/13/23 1502 0     Pain Loc --      Pain Education --      Exclude from Growth Chart --    No data found.  Updated Vital Signs BP (!) 158/73 (BP Location: Right Arm)   Pulse 88   Temp 99 F (37.2 C) (Oral)   Resp 18   SpO2 95%   Visual Acuity Right Eye Distance:   Left Eye Distance:   Bilateral Distance:    Right Eye Near:   Left Eye Near:    Bilateral Near:     Physical Exam Vitals and nursing note reviewed.  Constitutional:      Appearance: Normal appearance.  HENT:     Head: Normocephalic and atraumatic.     Right Ear: External ear normal.     Left Ear: External ear normal.     Nose: Nose normal.     Mouth/Throat:     Mouth: Mucous membranes are moist.  Eyes:     Conjunctiva/sclera: Conjunctivae normal.  Cardiovascular:     Rate and Rhythm: Normal rate.  Pulmonary:     Effort: Pulmonary effort is normal. No respiratory distress.  Musculoskeletal:        General: Normal range of motion.  Skin:    General: Skin is warm and dry.  Neurological:     General: No focal deficit present.     Mental Status: She is alert and oriented to person, place, and time.     GCS: GCS eye subscore is 4. GCS verbal subscore is 5. GCS motor subscore is 6.     Cranial Nerves: Cranial nerves 2-12 are intact. No cranial nerve deficit or facial asymmetry.     Sensory: Sensation is intact.     Motor: Motor function is intact. No weakness.  Psychiatric:        Mood and Affect: Mood normal.        Behavior: Behavior is cooperative.      UC Treatments / Results  Labs (all labs ordered are listed, but only  abnormal results are displayed) Labs Reviewed  CBC WITH DIFFERENTIAL/PLATELET - Abnormal; Notable for the following components:      Result Value   RBC 5.64 (*)    MCH 25.9 (*)    All other components within normal limits  COMPREHENSIVE METABOLIC PANEL WITH GFR - Abnormal; Notable for the following components:   Glucose, Bld 154 (*)    Creatinine, Ser 1.14 (*)    GFR, Estimated 50 (*)    All other components within normal limits  POCT FASTING CBG [facility] MANUAL ENTRY - Abnormal; Notable for the following components:   POCT Glucose ([facility]) 152 (*)    All other components within normal limits  VITAMIN B12    EKG   Radiology No results found.  Procedures Procedures (including critical care time)  Medications Ordered in UC Medications - No data to display  Initial Impression / Assessment and Plan / UC Course  I have reviewed the triage vital signs and the nursing notes.  Pertinent labs & imaging results that were available during my care of the patient were reviewed by me and considered in my medical decision making (see chart for details).  Vitals in triage reviewed, patient is hemodynamically stable.  PERRLA.  Extraocular movements intact.  LVO negative.  Cranial nerves II through XII grossly intact, GCS 15.  Strength 5 out of 5 in upper and lower extremities.  EKG shows NSR, w/o STE or STD.   CBG 152.  Nonfasting.  Suspect intermittent numbness and tingling are not related to acute CVA or emergent pathology.  Will check B12 due to prolonged metformin  use.  This is within normal  limits.  Does have some abnormal kidney function, has follow-up with PCP in a few weeks.  Strict emergency precautions given if symptoms worsen or evolve.  Plan of care, follow-up care return precautions given, no questions at this time.     Final Clinical Impressions(s) / UC Diagnoses   Final diagnoses:  Numbness and tingling in both hands  Dizziness  Type 2 diabetes mellitus with hyperglycemia,  without long-term current use of insulin  [facility])     Discharge Instructions      Hoy en la clnica, su nivel de azcar en sangre se normaliz. Asegrese de que tenga galletas, pastillas o algn refrigerio cuando sienta que le baja el azcar.  Sospecho que el entumecimiento y el hormigueo se deben a niveles bajos de vitamina B12 por el uso crnico de metformina. Le he indicado un suplemento de vitamina B12 para empezar. Se le han realizado anlisis y el personal se pondr en contacto con usted si necesita seguimiento urgente.  No dude en buscar atencin inmediata en el servicio de urgencias ms cercano si presenta dificultad para hablar, cada facial, debilidad unilateral o nuevos sntomas preocupantes.  Her blood sugar was normal today in clinic.  Ensure that she has some crackers, lozenges or little snacks to eat whenever she feels like her blood sugar is dropping.  I am suspicious that the numbness and tingling is coming from low B12 levels from chronic metformin  use.  I have sent in a B12 supplement to start.  Labs have been drawn and staff will contact you if urgent follow-up is needed.  Do not hesitate to seek immediate care at the nearest emergency department if you develop slurred speech, facial drooping, one-sided weakness, or new concerning symptoms.    ED Prescriptions     Medication Sig Dispense Auth. Provider   vitamin B-12 (CYANOCOBALAMIN ) 100 MCG tablet Take 1 tablet (100 mcg total) by mouth daily. 30 tablet Harlow Lighter, Zamya Culhane  N, FNP      PDMP not reviewed this encounter.   Harlow Lighter, Marcelus Dubberly  N, FNP 06/13/23 [phone]

## 2023-06-13 NOTE — Discharge Instructions (Addendum)
 Hoy en la [place], su nivel de azcar en sangre se normaliz. Asegrese de que tenga galletas, pastillas o algn refrigerio cuando sienta que le baja el azcar.  Sospecho que el entumecimiento y el hormigueo se deben a niveles bajos de vitamina B12 por el uso crnico de metformina. Le he indicado un suplemento de vitamina B12 para empezar. Se le han realizado anlisis y el personal se pondr en contacto con usted si necesita seguimiento urgente.  No dude en buscar atencin inmediata en el servicio de urgencias ms cercano si presenta dificultad para hablar, cada facial, debilidad unilateral o nuevos sntomas preocupantes.  Her blood sugar was normal today in clinic.  Ensure that she has some crackers, lozenges or little snacks to eat whenever she feels like her blood sugar is dropping.  I am suspicious that the numbness and tingling is coming from low B12 levels from chronic metformin  use.  I have sent in a B12 supplement to start.  Labs have been drawn and staff will contact you if urgent follow-up is needed.  Do not hesitate to seek immediate care at the nearest emergency department if you develop slurred speech, facial drooping, one-sided weakness, or new concerning symptoms.

## 2023-06-13 NOTE — ED Triage Notes (Signed)
 Pt reports she feels dizzy and has left forearm to finger tingling x 1 day  "Head feels big" Denies chest pain . Reports her her chin area feels numb on and off

## 2023-06-18 ENCOUNTER — Other Ambulatory Visit: Payer: Self-pay

## 2023-06-24 ENCOUNTER — Other Ambulatory Visit: Payer: Self-pay

## 2023-06-24 ENCOUNTER — Other Ambulatory Visit: Payer: Self-pay | Admitting: Family Medicine

## 2023-06-24 DIAGNOSIS — Z794 Long term (current) use of insulin: Secondary | ICD-10-CM

## 2023-06-27 ENCOUNTER — Encounter: Payer: Self-pay | Admitting: Pharmacist

## 2023-06-27 ENCOUNTER — Other Ambulatory Visit: Payer: Self-pay

## 2023-07-01 ENCOUNTER — Ambulatory Visit: Payer: Self-pay | Attending: Family Medicine | Admitting: Family Medicine

## 2023-07-01 ENCOUNTER — Other Ambulatory Visit: Payer: Self-pay

## 2023-07-01 ENCOUNTER — Encounter: Payer: Self-pay | Admitting: Family Medicine

## 2023-07-01 VITALS — BP 119/67 | HR 81 | Temp 98.3°F | Ht <= 58 in | Wt 161.0 lb

## 2023-07-01 DIAGNOSIS — E1169 Type 2 diabetes mellitus with other specified complication: Secondary | ICD-10-CM

## 2023-07-01 DIAGNOSIS — I10 Essential (primary) hypertension: Secondary | ICD-10-CM

## 2023-07-01 DIAGNOSIS — E1149 Type 2 diabetes mellitus with other diabetic neurological complication: Secondary | ICD-10-CM

## 2023-07-01 DIAGNOSIS — E039 Hypothyroidism, unspecified: Secondary | ICD-10-CM

## 2023-07-01 DIAGNOSIS — E1165 Type 2 diabetes mellitus with hyperglycemia: Secondary | ICD-10-CM

## 2023-07-01 DIAGNOSIS — M1712 Unilateral primary osteoarthritis, left knee: Secondary | ICD-10-CM

## 2023-07-01 DIAGNOSIS — Z7984 Long term (current) use of oral hypoglycemic drugs: Secondary | ICD-10-CM

## 2023-07-01 DIAGNOSIS — E785 Hyperlipidemia, unspecified: Secondary | ICD-10-CM

## 2023-07-01 LAB — POCT GLYCOSYLATED HEMOGLOBIN (HGB A1C): Hemoglobin A1C: 8.1 % — AB (ref 4.0–5.6)

## 2023-07-01 MED ORDER — LISINOPRIL-HYDROCHLOROTHIAZIDE 20-25 MG PO TABS
1.0000 | ORAL_TABLET | Freq: Every day | ORAL | 1 refills | Status: AC
  Filled 2023-07-01: qty 90, 90d supply, fill #0
  Filled 2024-01-05: qty 90, 90d supply, fill #1

## 2023-07-01 MED ORDER — DAPAGLIFLOZIN PROPANEDIOL 10 MG PO TABS
10.0000 mg | ORAL_TABLET | Freq: Every day | ORAL | 1 refills | Status: AC
  Filled 2023-07-01: qty 90, 90d supply, fill #0

## 2023-07-01 MED ORDER — PIOGLITAZONE HCL 45 MG PO TABS
45.0000 mg | ORAL_TABLET | Freq: Every day | ORAL | 1 refills | Status: AC
  Filled 2023-07-01: qty 90, 90d supply, fill #0
  Filled 2024-01-05: qty 90, 90d supply, fill #1

## 2023-07-01 MED ORDER — GABAPENTIN 300 MG PO CAPS
300.0000 mg | ORAL_CAPSULE | Freq: Every day | ORAL | 3 refills | Status: AC
  Filled 2023-07-01: qty 30, 30d supply, fill #0
  Filled 2024-01-05: qty 30, 30d supply, fill #1

## 2023-07-01 MED ORDER — GLIPIZIDE ER 10 MG PO TB24
20.0000 mg | ORAL_TABLET | Freq: Every day | ORAL | 1 refills | Status: AC
  Filled 2023-07-01: qty 60, 30d supply, fill #0
  Filled 2024-01-05: qty 60, 30d supply, fill #1
  Filled 2024-01-19: qty 60, 30d supply, fill #2

## 2023-07-01 MED ORDER — EZETIMIBE 10 MG PO TABS
10.0000 mg | ORAL_TABLET | Freq: Every day | ORAL | 3 refills | Status: AC
  Filled 2023-07-01: qty 90, 90d supply, fill #0
  Filled 2024-01-05: qty 90, 90d supply, fill #1

## 2023-07-01 MED ORDER — AMLODIPINE BESYLATE 10 MG PO TABS
10.0000 mg | ORAL_TABLET | Freq: Every day | ORAL | 1 refills | Status: AC
  Filled 2023-07-01: qty 90, 90d supply, fill #0
  Filled 2024-01-05: qty 90, 90d supply, fill #1

## 2023-07-01 MED ORDER — METFORMIN HCL 1000 MG PO TABS
1000.0000 mg | ORAL_TABLET | Freq: Two times a day (BID) | ORAL | 1 refills | Status: AC
Start: 2023-07-01 — End: ?
  Filled 2023-07-01: qty 180, 90d supply, fill #0
  Filled 2024-01-05: qty 180, 90d supply, fill #1

## 2023-07-01 MED ORDER — ROSUVASTATIN CALCIUM 40 MG PO TABS
40.0000 mg | ORAL_TABLET | Freq: Every day | ORAL | 1 refills | Status: AC
  Filled 2023-07-01 – 2024-01-05 (×2): qty 90, 90d supply, fill #0

## 2023-07-01 MED ORDER — MELOXICAM 7.5 MG PO TABS
7.5000 mg | ORAL_TABLET | Freq: Every day | ORAL | 3 refills | Status: AC
  Filled 2023-07-01: qty 30, 30d supply, fill #0
  Filled 2024-01-05: qty 30, 30d supply, fill #1

## 2023-07-01 NOTE — Patient Instructions (Signed)
 Dao nervioso debido a la diabetes (neuropata diabtica): qu debe saber Nerve Damage From Diabetes (Diabetic Neuropathy): What to Know La neuropata diabtica ocurre cuando la diabetes causa daos en los nervios. Con [employer], las personas diabticas pueden sufrir un dao nervioso en todo el cuerpo.  Hay diferentes tipos de neuropata diabtica: Neuropata perifrica. Este es el tipo ms frecuente. Daa los nervios perifricos. Estos son los nervios que envan seales entre la mdula espinal y otras partes del cuerpo. Este tipo suele [employer], las piernas, las manos y [place]. Neuropata autnoma. Este tipo causa daos en los nervios autnomos. Estos nervios controlan las cosas que el cuerpo hace automticamente y que uno no controla. Por ejemplo, los latidos cardacos, la Arts development officer, la presin arterial, la accin de orinar, la digestin, la sudoracin y la funcin sexual. Tambin puede afectar la respuesta del cuerpo a los cambios en el azcar en la sangre (glucosa). Neuropata focal. Este tipo afecta una zona del cuerpo, como un brazo, una pierna o el rostro. Puede comprometer un nervio o un pequeo grupo de nervios. Puede ser dolorosa y difcil de predecir. Se produce con mayor frecuencia en los adultos de edad avanzada que tienen diabetes. Este tipo puede Education officer, community. Por lo general, mejora con el tiempo y no causa problemas a largo plazo. Neuropata proximal. Este tipo afecta los nervios de los muslos, las caderas, las nalgas o las piernas. Causa dolor muy intenso, debilidad y Soil scientist, generalmente en los msculos de los muslos. Es ms frecuente en los hombres mayores y las personas que tienen diabetes tipo 2. El tiempo que demora la recuperacin puede variar. Cules son las causas? Las neuropatas perifricas, autnomas y focales son consecuencia de la diabetes que no se controla correctamente con Pharmacist, community.  Se desconoce la causa de la neuropata  proximal. Puede estar relacionada con la irritacin y la hinchazn causadas por no tener bajo control los niveles de Banker. Cules son los signos o sntomas? Neuropata perifrica El dao nervioso se produce lentamente con el tiempo. Cuando los nervios de los pies y las piernas ya no funcionan, usted puede tener: Stage manager, sensacin pulstil o dolor en las piernas o los pies. Calambres en las piernas o los pies. Prdida de la sensibilidad (adormecimiento) e incapacidad de sentir presin o dolor en los pies. Estas pueden ser las consecuencias: Callosidades gruesas o llagas, tambin llamadas lceras, en las zonas de presin constante. Imposibilidad de sentir los cambios de [place]. Cambios en la forma de los pies. Debilidad muscular. Prdida del equilibrio o de la coordinacin. Neuropata autnoma Los sntomas varan segn qu nervios estn afectados. Los sntomas pueden incluir: Problemas digestivos, tales como: Vomitar o sentir ganas de vomitar. Falta de apetito. Distensin. Heces acuosas (diarrea) o dificultad para defecar (estreimiento). Dificultad para tragar. Prdida de peso involuntaria. Problemas con el corazn, la sangre y los pulmones, por ejemplo: Pensions consultant, especialmente al ponerse de pie. Desmayos. Falta de aire. Latidos cardacos irregulares. Problemas de la vejiga, por ejemplo: Dificultad para comenzar a Geographical information systems officer o dejar de Geographical information systems officer. Tener fugas de [place]. Problemas para vaciar la vejiga. Infecciones urinarias (IU). Problemas con otras funciones del cuerpo, por ejemplo: Transpiracin. Puede transpirar demasiado o muy poco. Temperatura. Se puede acalorar fcilmente. O puede sentir ms fro de lo habitual. La funcin sexual. Es posible que los hombres no puedan conseguir o Designer, fashion/clothing. Las mujeres pueden tener sequedad vaginal y problemas para excitarse. Neuropata focal Los sntomas afectan solamente una zona del cuerpo.  Los sntomas frecuentes  incluyen los siguientes: Adormecimiento u hormigueo. Dolor urente. Sensacin de pinchazos. Piel muy sensible. Debilidad. Incapacidad de moverse (parlisis). Espasmos musculares. Disminucin, o desgaste, de la masa muscular. Coordinacin deficiente. Visin doble o borrosa. Neuropata proximal Dolor muy intenso y repentino en la cadera, los muslos o las nalgas. Dolor que baja por la espalda hasta las piernas. Esto se denomina citica. Dolor y Physicist, medical los brazos y la piernas. Hormigueo. Prdida del control de la miccin y la defecacin. Debilidad y desgaste de los msculos del muslo. Problemas para levantarse desde la posicin de sentado. Hinchazn en el estmago. Prdida de peso involuntaria. Cmo se diagnostica? El diagnstico vara en funcin del tipo de neuropata que el mdico considera que usted tiene. Puede incluir lo siguiente: Un examen neurolgico. En este examen, se evala lo siguiente: Sus reflejos. Cmo se mueve. Qu puede sentir. Anlisis de [place]. Puncin lumbar. Con esta prueba, se analiza el lquido que rodea la mdula espinal. Pruebas de diagnstico por imgenes, por ejemplo: Una exploracin por tomografa computarizada (TC). Una resonancia magntica (RM). Pruebas de los nervios, tales como: Electromiograma (EMG). Este examina los nervios que controlan los msculos. Estudio de [employer]. Esta prueba revisa con qu velocidad las seales pasan por los nervios. Biopsia. En esta prueba, se extrae un pequeo trozo de nervio para Public librarian. En el caso de la neuropata [place], pueden hacerle estudios para controlar cosas como: La presin arterial. La frecuencia cardaca. La respiracin. La digestin. La vejiga. No hay una prueba especfica para la neuropata proximal. Pero es posible que le hagan estudios para descartar otras posibles causas de los sntomas. Cmo se trata? El objetivo del tratamiento es impedir que el dao nervioso empeore.  El tratamiento puede incluir: Seguir su plan de control de la diabetes. Esto lo ayudar a Metallurgist de azcar en la sangre y su nivel de A1C dentro de su rango objetivo. Este es el tratamiento ms importante. Tomar analgsicos. Siga estas instrucciones en su casa: Tratamiento de la diabetes Siga el plan de control de la diabetes como se lo haya indicado el mdico. Controle sus niveles de azcar en la sangre. Mantenga su azcar en la sangre dentro del rango objetivo. Hgase controlar el nivel de A1C al menos 2 veces al ao o con la frecuencia que le hayan indicado. Use los medicamentos nicamente segn las indicaciones. Esto incluye la insulina y los medicamentos para la diabetes. Estilo de vida  No fume, vapee ni consuma nicotina o tabaco. Haga actividad fsica todos los [place]. Incluya ejercicios de equilibrio y entrenamiento de Primary school teacher. Siga un plan de alimentacin saludable. Trabaje con su mdico para mantener la presin arterial bajo control. Instrucciones generales Pregntele al mdico si es Therapist, art o usar mquinas mientras toma el medicamento. Revise su piel y pies todos los [address]  [address] para detectar si tiene: Cortes. Moretones. Enrojecimiento. Ampollas. Llagas. Comunquese con un mdico si: Aparecen nuevos sntomas. Los sntomas empeoran. Los sntomas no mejoran con Scientist, research (medical). Solicite ayuda de inmediato si: Sbitamente, siente debilidad o pierde la coordinacin. Tiene dificultad para hablar. Siente dolor u opresin en el pecho. No puede mover una parte del cuerpo de repente. Estos sntomas pueden Customer service manager. Llame al 911 de inmediato. No espere a ver si los sntomas desaparecen. No conduzca por sus propios medios [employer]. Esta informacin no tiene Theme park manager el consejo del mdico. Asegrese de hacerle al mdico cualquier pregunta que tenga. Document Revised: 10/10/2022 Document Reviewed: 10/10/2022 Elsevier Patient Education  2025 [employer].

## 2023-07-01 NOTE — Progress Notes (Signed)
 Subjective:  Patient ID: Linda House, female    DOB: January 31, 1945  Age: 78 y.o. MRN: [id number]  CC: No chief complaint on file.     Discussed the use of AI scribe software for clinical note transcription with the patient, who gave verbal consent to proceed.  History of Present Illness Linda House is a 78 year old female with diabetes who presents with tingling in her hands.  She experiences intermittent tingling in her hands extending to her elbows for the past two months, affecting one hand at a time and lasting for seconds. The sensation does not disturb her sleep. She was seen at urgent care for this and B12 level was normal.  She was placed on vitamin B12 supplements. Her diabetes management is suboptimal as she ran out of Actos  45 mg, two weeks ago, resulting in an increase in her A1c from 7.6 to 8.1.  She endorses remaining on metformin , glipizide  and Farxiga .  She has not been taking meloxicam  for knee arthritis, leading to significant knee pain. She also experiences occasional soreness and inflammation in her shoulder joints and the dorsum of her left foot.  She endorses adherence with her antihypertensive and her statin.  Last lipid panel revealed evaded LDL and total cholesterol levels.  She consistently takes her thyroid  medication but has not taken trazodone  for sleep.     Past Medical History:  Diagnosis Date   Diabetes mellitus    Diabetes mellitus without complication (HCC)    Hypertension     No past surgical history on file.  Family History  Problem Relation Age of Onset   Diabetes Father    Hypertension Father     Social History   Socioeconomic History   Marital status: Married    Spouse name: Not on file   Number of children: Not on file   Years of education: Not on file   Highest education level: Not on file  Occupational History   Not on file  Tobacco Use   Smoking status: Never   Smokeless tobacco: Never  Vaping  Use   Vaping status: Never Used  Substance and Sexual Activity   Alcohol use: No   Drug use: No   Sexual activity: Never  Other Topics Concern   Not on file  Social History Narrative   ** Merged History Encounter **       Social Drivers of Corporate investment banker Strain: Not on file  Food Insecurity: Not on file  Transportation Needs: Not on file  Physical Activity: Not on file  Stress: Not on file  Social Connections: Not on file    No Known Allergies  Outpatient Medications Prior to Visit  Medication Sig Dispense Refill   aspirin  EC 81 MG tablet Take 1 tablet (81 mg total) by mouth daily. 30 tablet 3   benzonatate  (TESSALON ) 100 MG capsule Take 1 capsule (100 mg total) by mouth 3 (three) times daily as needed. 30 capsule 0   Blood Glucose Monitoring Suppl (TRUE METRIX METER) w/Device KIT Use to check blood sugar daily. E11.65 1 kit 0   clotrimazole  (LOTRIMIN ) 1 % cream Apply 1 Application topically 2 (two) times daily. 30 g 0   diclofenac  Sodium (VOLTAREN ) 1 % GEL Apply 4 g topically 4 (four) times daily. 150 g 1   fluticasone  (FLONASE ) 50 MCG/ACT nasal spray Place 2 sprays into both nostrils daily. 16 g 6   glucose blood test strip Use as instructed to check blood  sugar daily. E11.65 100 each 11   levothyroxine  (SYNTHROID ) 125 MCG tablet Take 1 tablet (125 mcg total) by mouth daily before breakfast. 90 tablet 1   olopatadine  (PATANOL) 0.1 % ophthalmic solution Place 1 drop into both eyes 2 (two) times daily. 5 mL 1   traMADol  (ULTRAM ) 50 MG tablet Take 1 tablet (50 mg total) by mouth at bedtime as needed. 30 tablet 1   TRUEplus Lancets 28G MISC Use to check blood sugar daily. E11.65 100 each 11   vitamin B-12 (CYANOCOBALAMIN ) 100 MCG tablet Take 1 tablet (100 mcg total) by mouth daily. 30 tablet 0   amLODipine  (NORVASC ) 10 MG tablet Take 1 tablet (10 mg total) by mouth daily. 90 tablet 1   dapagliflozin  propanediol (FARXIGA ) 10 MG TABS tablet Take 1 tablet (10 mg total)  by mouth daily before breakfast. 90 tablet 1   ezetimibe  (ZETIA ) 10 MG tablet Take 1 tablet (10 mg total) by mouth daily. 90 tablet 3   glipiZIDE  (GLIPIZIDE  XL) 10 MG 24 hr tablet Take 2 tablets (20 mg total) by mouth daily. 180 tablet 1   lisinopril -hydrochlorothiazide  (ZESTORETIC ) 20-25 MG tablet TAKE 1 TABLET BY MOUTH DAILY. 90 tablet 1   meloxicam  (MOBIC ) 7.5 MG tablet Take 1 tablet (7.5 mg total) by mouth daily. Prn pain 30 tablet 3   metFORMIN  (GLUCOPHAGE ) 1000 MG tablet Take 1 tablet (1,000 mg total) by mouth 2 (two) times daily with a meal. 180 tablet 1   pioglitazone  (ACTOS ) 45 MG tablet Take 1 tablet (45 mg total) by mouth daily. 90 tablet 1   rosuvastatin  (CRESTOR ) 40 MG tablet Take 1 tablet (40 mg total) by mouth daily. 90 tablet 1   traZODone  (DESYREL ) 50 MG tablet Take 0.5-1 tablets (25-50 mg total) by mouth at bedtime as needed for sleep. 30 tablet 3   No facility-administered medications prior to visit.     ROS Review of Systems  Constitutional:  Negative for activity change and appetite change.  HENT:  Negative for sinus pressure and sore throat.   Respiratory:  Negative for chest tightness, shortness of breath and wheezing.   Cardiovascular:  Negative for chest pain and palpitations.  Gastrointestinal:  Negative for abdominal distention, abdominal pain and constipation.  Genitourinary: Negative.   Musculoskeletal:        See HPI  Neurological:  Positive for numbness.  Psychiatric/Behavioral:  Negative for behavioral problems and dysphoric mood.     Objective:  BP 119/67 (BP Location: Right Arm, Patient Position: Sitting, Cuff Size: Normal)   Pulse 81   Temp 98.3 F (36.8 C) (Oral)   Ht 4' 10 (1.473 m)   Wt 161 lb (73 kg)   SpO2 95%   BMI 33.65 kg/m      07/01/2023    8:55 AM 06/13/2023    3:06 PM 12/19/2022    9:44 AM  BP/Weight  Systolic BP 119 158 124  Diastolic BP 67 73 67  Wt. (Lbs) 161  162  BMI 33.65 kg/m2  32.72 kg/m2      Physical  Exam Constitutional:      Appearance: She is well-developed.   Cardiovascular:     Rate and Rhythm: Normal rate.     Heart sounds: Normal heart sounds. No murmur heard. Pulmonary:     Effort: Pulmonary effort is normal.     Breath sounds: Normal breath sounds. No wheezing or rales.  Chest:     Chest wall: No tenderness.  Abdominal:     General:  Bowel sounds are normal. There is no distension.     Palpations: Abdomen is soft. There is no mass.     Tenderness: There is no abdominal tenderness.   Musculoskeletal:        General: Normal range of motion.     Right lower leg: No edema.     Left lower leg: No edema.     Comments: Negative Tinel and Phalen signs   Neurological:     Mental Status: She is alert and oriented to person, place, and time.   Psychiatric:        Mood and Affect: Mood normal.        Latest Ref Rng & Units 06/13/2023    3:35 PM 12/19/2022   10:40 AM 06/13/2022   10:18 AM  CMP  Glucose 70 - 99 mg/dL 191  478  92   BUN 8 - 23 mg/dL 17  11  10    Creatinine 0.44 - 1.00 mg/dL 2.95  6.21  3.08   Sodium 135 - 145 mmol/L 138  140  139   Potassium 3.5 - 5.1 mmol/L 4.0  4.6  4.8   Chloride 98 - 111 mmol/L 99  101  102   CO2 22 - 32 mmol/L 27  26  23    Calcium  8.9 - 10.3 mg/dL 9.6  9.7  9.3   Total Protein 6.5 - 8.1 g/dL 7.7  7.2  7.2   Total Bilirubin 0.0 - 1.2 mg/dL 0.4  0.3  0.3   Alkaline Phos 38 - 126 U/L 109  153  148   AST 15 - 41 U/L 19  13  13    ALT 0 - 44 U/L 18  10  16      Lipid Panel     Component Value Date/Time   CHOL 261 (H) 12/19/2022 1040   TRIG 142 12/19/2022 1040   HDL 61 12/19/2022 1040   CHOLHDL 3.4 05/24/2020 0918   CHOLHDL 3.4 02/19/2016 1021   VLDL 25 08/29/2015 0849   LDLCALC 174 (H) 12/19/2022 1040    CBC    Component Value Date/Time   WBC 8.7 06/13/2023 1535   RBC 5.64 (H) 06/13/2023 1535   HGB 14.6 06/13/2023 1535   HGB 12.2 08/27/2016 0919   HCT 45.6 06/13/2023 1535   HCT 38.2 08/27/2016 0919   PLT 361 06/13/2023  1535   PLT 355 08/27/2016 0919   MCV 80.9 06/13/2023 1535   MCV 79 08/27/2016 0919   MCH 25.9 (L) 06/13/2023 1535   MCHC 32.0 06/13/2023 1535   RDW 14.6 06/13/2023 1535   RDW 15.2 08/27/2016 0919   LYMPHSABS 2.3 06/13/2023 1535   LYMPHSABS 2.5 08/27/2016 0919   MONOABS 0.5 06/13/2023 1535   EOSABS 0.4 06/13/2023 1535   EOSABS 0.2 08/27/2016 0919   BASOSABS 0.1 06/13/2023 1535   BASOSABS 0.0 08/27/2016 0919    Lab Results  Component Value Date   HGBA1C 8.1 (A) 07/01/2023    Lab Results  Component Value Date   HGBA1C 8.1 (A) 07/01/2023   HGBA1C 7.6 (A) 12/19/2022   HGBA1C 7.5 (A) 09/19/2022    Lab Results  Component Value Date   TSH 2.030 12/19/2022      1. Type 2 diabetes mellitus with hyperglycemia, with long-term current use of insulin  (HCC) (Primary) Uncontrolled with A1c of 8.1 up from 7.6 previously, goal of less than 7.0 Running out of Actos  contributory Will refill of her chronic medications and reassess in 3 months Counseled on Diabetic  diet, my plate method, 409 minutes of moderate intensity exercise/week Blood sugar logs with fasting goals of 80-120 mg/dl, random of less than 811 and in the event of sugars less than 60 mg/dl or greater than 914 mg/dl encouraged to notify the clinic. Advised on the need for annual eye exams, annual foot exams, Pneumonia vaccine. - POCT glycosylated hemoglobin (Hb A1C) - Microalbumin/Creatinine Ratio, Urine - pioglitazone  (ACTOS ) 45 MG tablet; Take 1 tablet (45 mg total) by mouth daily.  Dispense: 90 tablet; Refill: 1 - dapagliflozin  propanediol (FARXIGA ) 10 MG TABS tablet; Take 1 tablet (10 mg total) by mouth daily before breakfast.  Dispense: 90 tablet; Refill: 1 - glipiZIDE  (GLIPIZIDE  XL) 10 MG 24 hr tablet; Take 2 tablets (20 mg total) by mouth daily.  Dispense: 180 tablet; Refill: 1 - metFORMIN  (GLUCOPHAGE ) 1000 MG tablet; Take 1 tablet (1,000 mg total) by mouth 2 (two) times daily with a meal.  Dispense: 180 tablet; Refill:  1  2. Essential hypertension, benign Controlled Counseled on blood pressure goal of less than 130/80, low-sodium, DASH diet, medication compliance, 150 minutes of moderate intensity exercise per week. Discussed medication compliance, adverse effects. - amLODipine  (NORVASC ) 10 MG tablet; Take 1 tablet (10 mg total) by mouth daily.  Dispense: 90 tablet; Refill: 1 - lisinopril -hydrochlorothiazide  (ZESTORETIC ) 20-25 MG tablet; TAKE 1 TABLET BY MOUTH DAILY.  Dispense: 90 tablet; Refill: 1  3. Primary osteoarthritis of left knee Uncontrolled due to running out of meloxicam  Meloxicam  refilled - meloxicam  (MOBIC ) 7.5 MG tablet; Take 1 tablet (7.5 mg total) by mouth daily. Prn pain  Dispense: 30 tablet; Refill: 3  4. Hypothyroidism, unspecified type Controlled Will check thyroid  labs and adjust regimen accordingly - T4, free - TSH - T3  5. Hyperlipidemia associated with type 2 diabetes mellitus (HCC) Uncontrolled Will check lipid panel and if LDL is still above goal of less than 100 will refer to the lipid clinic - LP+Non-HDL Cholesterol - ezetimibe  (ZETIA ) 10 MG tablet; Take 1 tablet (10 mg total) by mouth daily.  Dispense: 90 tablet; Refill: 3 - rosuvastatin  (CRESTOR ) 40 MG tablet; Take 1 tablet (40 mg total) by mouth daily.  Dispense: 90 tablet; Refill: 1  6. Type 2 diabetes mellitus with other neurologic complication, without long-term current use of insulin  (HCC) Could explain her forearm paresthesia Initiate gabapentin  If symptoms persist at next visit, consider referral for nerve conduction studies - gabapentin  (NEURONTIN ) 300 MG capsule; Take 1 capsule (300 mg total) by mouth at bedtime.  Dispense: 30 capsule; Refill: 3 - Ambulatory referral to Ophthalmology   Meds ordered this encounter  Medications   gabapentin  (NEURONTIN ) 300 MG capsule    Sig: Take 1 capsule (300 mg total) by mouth at bedtime.    Dispense:  30 capsule    Refill:  3   pioglitazone  (ACTOS ) 45 MG tablet     Sig: Take 1 tablet (45 mg total) by mouth daily.    Dispense:  90 tablet    Refill:  1   amLODipine  (NORVASC ) 10 MG tablet    Sig: Take 1 tablet (10 mg total) by mouth daily.    Dispense:  90 tablet    Refill:  1   dapagliflozin  propanediol (FARXIGA ) 10 MG TABS tablet    Sig: Take 1 tablet (10 mg total) by mouth daily before breakfast.    Dispense:  90 tablet    Refill:  1    Dose increase   ezetimibe  (ZETIA ) 10 MG tablet    Sig: Take  1 tablet (10 mg total) by mouth daily.    Dispense:  90 tablet    Refill:  3   glipiZIDE  (GLIPIZIDE  XL) 10 MG 24 hr tablet    Sig: Take 2 tablets (20 mg total) by mouth daily.    Dispense:  180 tablet    Refill:  1   lisinopril -hydrochlorothiazide  (ZESTORETIC ) 20-25 MG tablet    Sig: TAKE 1 TABLET BY MOUTH DAILY.    Dispense:  90 tablet    Refill:  1   metFORMIN  (GLUCOPHAGE ) 1000 MG tablet    Sig: Take 1 tablet (1,000 mg total) by mouth 2 (two) times daily with a meal.    Dispense:  180 tablet    Refill:  1   rosuvastatin  (CRESTOR ) 40 MG tablet    Sig: Take 1 tablet (40 mg total) by mouth daily.    Dispense:  90 tablet    Refill:  1   meloxicam  (MOBIC ) 7.5 MG tablet    Sig: Take 1 tablet (7.5 mg total) by mouth daily. Prn pain    Dispense:  30 tablet    Refill:  3    Follow-up: Return in about 3 months (around 10/01/2023) for Chronic medical conditions.       Joaquin Mulberry, MD, FAAFP. [facility] [place], [place] [phone]   07/01/2023, 9:53 AM

## 2023-07-03 LAB — LP+NON-HDL CHOLESTEROL
Cholesterol, Total: 220 mg/dL — ABNORMAL HIGH (ref 100–199)
HDL: 67 mg/dL (ref >39)
LDL Chol Calc (NIH): 123 mg/dL — ABNORMAL HIGH (ref 0–99)
Total Non-HDL-Chol (LDL+VLDL): 153 mg/dL — ABNORMAL HIGH (ref 0–129)
Triglycerides: 171 mg/dL — ABNORMAL HIGH (ref 0–149)
VLDL Cholesterol Cal: 30 mg/dL (ref 5–40)

## 2023-07-03 LAB — TSH: TSH: 5.29 u[IU]/mL — ABNORMAL HIGH (ref 0.450–4.500)

## 2023-07-03 LAB — MICROALBUMIN / CREATININE URINE RATIO
Creatinine, Urine: 90.6 mg/dL
Microalb/Creat Ratio: 48 mg/g{creat} — ABNORMAL HIGH (ref 0–29)
Microalbumin, Urine: 43.4 ug/mL

## 2023-07-03 LAB — T3: T3, Total: 122 ng/dL (ref 71–180)

## 2023-07-03 LAB — T4, FREE: Free T4: 1.49 ng/dL (ref 0.82–1.77)

## 2023-07-04 ENCOUNTER — Ambulatory Visit: Payer: Self-pay | Admitting: Family Medicine

## 2023-07-04 ENCOUNTER — Other Ambulatory Visit: Payer: Self-pay

## 2023-07-04 DIAGNOSIS — E039 Hypothyroidism, unspecified: Secondary | ICD-10-CM

## 2023-07-04 MED ORDER — LEVOTHYROXINE SODIUM 137 MCG PO TABS
125.0000 ug | ORAL_TABLET | Freq: Every day | ORAL | 1 refills | Status: DC
  Filled 2023-07-04 – 2023-09-08 (×2): qty 90, 90d supply, fill #0

## 2023-07-24 NOTE — Telephone Encounter (Signed)
 Copied from CRM [id number]. Topic: Referral - Status >> Jul 24, 2023  2:42 PM Tiffini S wrote: Reason for CRM: Patient daughter Deloria Mans [phone] (please leave detail message as she may be working) called stating that they found a eye doctor from the list   [facility] [address] [place], [place], [place] [address]  Phone [phone] Fax [phone]

## 2023-09-08 ENCOUNTER — Other Ambulatory Visit: Payer: Self-pay

## 2023-10-02 ENCOUNTER — Encounter: Payer: Self-pay | Admitting: Family Medicine

## 2023-10-02 ENCOUNTER — Ambulatory Visit (HOSPITAL_COMMUNITY)
Admission: RE | Admit: 2023-10-02 | Discharge: 2023-10-02 | Disposition: A | Discharge status: Home / Self Care | Payer: Self-pay | Source: Ambulatory Visit | Attending: Family Medicine | Admitting: Family Medicine

## 2023-10-02 ENCOUNTER — Ambulatory Visit: Payer: Self-pay | Attending: Family Medicine | Admitting: Family Medicine

## 2023-10-02 ENCOUNTER — Other Ambulatory Visit: Payer: Self-pay

## 2023-10-02 VITALS — BP 112/69 | HR 70 | Ht <= 58 in | Wt 161.2 lb

## 2023-10-02 DIAGNOSIS — E1169 Type 2 diabetes mellitus with other specified complication: Secondary | ICD-10-CM

## 2023-10-02 DIAGNOSIS — E039 Hypothyroidism, unspecified: Secondary | ICD-10-CM

## 2023-10-02 DIAGNOSIS — Z79899 Other long term (current) drug therapy: Secondary | ICD-10-CM

## 2023-10-02 DIAGNOSIS — Z794 Long term (current) use of insulin: Secondary | ICD-10-CM

## 2023-10-02 DIAGNOSIS — M170 Bilateral primary osteoarthritis of knee: Secondary | ICD-10-CM | POA: Insufficient documentation

## 2023-10-02 DIAGNOSIS — Z7982 Long term (current) use of aspirin: Secondary | ICD-10-CM

## 2023-10-02 DIAGNOSIS — E1149 Type 2 diabetes mellitus with other diabetic neurological complication: Secondary | ICD-10-CM

## 2023-10-02 DIAGNOSIS — E785 Hyperlipidemia, unspecified: Secondary | ICD-10-CM

## 2023-10-02 DIAGNOSIS — H539 Unspecified visual disturbance: Secondary | ICD-10-CM

## 2023-10-02 DIAGNOSIS — Z79890 Hormone replacement therapy (postmenopausal): Secondary | ICD-10-CM

## 2023-10-02 DIAGNOSIS — I10 Essential (primary) hypertension: Secondary | ICD-10-CM

## 2023-10-02 DIAGNOSIS — Z7984 Long term (current) use of oral hypoglycemic drugs: Secondary | ICD-10-CM

## 2023-10-02 LAB — POCT GLYCOSYLATED HEMOGLOBIN (HGB A1C): HbA1c, POC (controlled diabetic range): 7.2 % — AB (ref 0.0–7.0)

## 2023-10-02 MED ORDER — TRAMADOL HCL 50 MG PO TABS
50.0000 mg | ORAL_TABLET | Freq: Every evening | ORAL | 1 refills | Status: AC | PRN
  Filled 2023-10-02: qty 30, 30d supply, fill #0

## 2023-10-02 NOTE — Progress Notes (Signed)
 Subjective:  Patient ID: Linda House, female    DOB: 1945-06-24  Age: 78 y.o. MRN: [id number]  CC: Medical Management of Chronic Issues     Discussed the use of AI scribe software for clinical note transcription with the patient, who gave verbal consent to proceed.  History of Present Illness Linda House is a 78 year old female with a history of type 2 diabetes mellitus , hypothyroidism, hyperlipidemia, hypertension who presents for follow-up and medication management.  Cholesterol levels were previously high, and she is here for re-evaluation. Her thyroid  medication was recently increased to 137 mcg due to abnormal thyroid  levels, and she has enough refills for all medications except this one.  She experiences persistent bilateral knee pain extending to her feet, attributed to arthritis. Meloxicam  7.5 mg provides minimal relief, and cold weather worsens the pain.  She requires a referral to a vision clinic for ongoing vision issues.  I had placed a referral for her in 06/2023 to ophthalmology.  Her A1c level has improved from 8.1 to 7.2. No new symptoms apart from those discussed.    Past Medical History:  Diagnosis Date   Diabetes mellitus    Diabetes mellitus without complication (HCC)    Hypertension     No past surgical history on file.  Family History  Problem Relation Age of Onset   Diabetes Father    Hypertension Father     Social History   Socioeconomic History   Marital status: Married    Spouse name: Not on file   Number of children: Not on file   Years of education: Not on file   Highest education level: Not on file  Occupational History   Not on file  Tobacco Use   Smoking status: Never   Smokeless tobacco: Never  Vaping Use   Vaping status: Never Used  Substance and Sexual Activity   Alcohol use: No   Drug use: No   Sexual activity: Never  Other Topics Concern   Not on file  Social History Narrative   ** Merged  History Encounter **       Social Drivers of Corporate investment banker Strain: Not on file  Food Insecurity: Not on file  Transportation Needs: Not on file  Physical Activity: Not on file  Stress: Not on file  Social Connections: Not on file    No Known Allergies  Outpatient Medications Prior to Visit  Medication Sig Dispense Refill   amLODipine  (NORVASC ) 10 MG tablet Take 1 tablet (10 mg total) by mouth daily. 90 tablet 1   aspirin  EC 81 MG tablet Take 1 tablet (81 mg total) by mouth daily. 30 tablet 3   Blood Glucose Monitoring Suppl (TRUE METRIX METER) w/Device KIT Use to check blood sugar daily. E11.65 1 kit 0   clotrimazole  (LOTRIMIN ) 1 % cream Apply 1 Application topically 2 (two) times daily. 30 g 0   dapagliflozin  propanediol (FARXIGA ) 10 MG TABS tablet Take 1 tablet (10 mg total) by mouth daily before breakfast. 90 tablet 1   diclofenac  Sodium (VOLTAREN ) 1 % GEL Apply 4 g topically 4 (four) times daily. 150 g 1   ezetimibe  (ZETIA ) 10 MG tablet Take 1 tablet (10 mg total) by mouth daily. 90 tablet 3   fluticasone  (FLONASE ) 50 MCG/ACT nasal spray Place 2 sprays into both nostrils daily. 16 g 6   gabapentin  (NEURONTIN ) 300 MG capsule Take 1 capsule (300 mg total) by mouth at bedtime. 30 capsule 3  glipiZIDE  (GLIPIZIDE  XL) 10 MG 24 hr tablet Take 2 tablets (20 mg total) by mouth daily. 180 tablet 1   glucose blood test strip Use as instructed to check blood sugar daily. E11.65 100 each 11   levothyroxine  (SYNTHROID ) 137 MCG tablet Take 1 tablet (137 mcg total) by mouth daily before breakfast. 90 tablet 1   lisinopril -hydrochlorothiazide  (ZESTORETIC ) 20-25 MG tablet TAKE 1 TABLET BY MOUTH DAILY. 90 tablet 1   meloxicam  (MOBIC ) 7.5 MG tablet Take 1 tablet (7.5 mg total) by mouth daily as needed for pain. 30 tablet 3   metFORMIN  (GLUCOPHAGE ) 1000 MG tablet Take 1 tablet (1,000 mg total) by mouth 2 (two) times daily with a meal. 180 tablet 1   olopatadine  (PATANOL) 0.1 %  ophthalmic solution Place 1 drop into both eyes 2 (two) times daily. 5 mL 1   pioglitazone  (ACTOS ) 45 MG tablet Take 1 tablet (45 mg total) by mouth daily. 90 tablet 1   rosuvastatin  (CRESTOR ) 40 MG tablet Take 1 tablet (40 mg total) by mouth daily. 90 tablet 1   TRUEplus Lancets 28G MISC Use to check blood sugar daily. E11.65 100 each 11   vitamin B-12 (CYANOCOBALAMIN ) 100 MCG tablet Take 1 tablet (100 mcg total) by mouth daily. 30 tablet 0   traMADol  (ULTRAM ) 50 MG tablet Take 1 tablet (50 mg total) by mouth at bedtime as needed. 30 tablet 1   benzonatate  (TESSALON ) 100 MG capsule Take 1 capsule (100 mg total) by mouth 3 (three) times daily as needed. (Patient not taking: Reported on 10/02/2023) 30 capsule 0   Calcium  500-125 MG-UNIT TABS Take 1 tablet by mouth daily. (Patient not taking: Reported on 12/31/2013) 90 each 3   No facility-administered medications prior to visit.     ROS Review of Systems  Constitutional:  Negative for activity change and appetite change.  HENT:  Negative for sinus pressure and sore throat.   Eyes:  Positive for visual disturbance.  Respiratory:  Negative for chest tightness, shortness of breath and wheezing.   Cardiovascular:  Negative for chest pain and palpitations.  Gastrointestinal:  Negative for abdominal distention, abdominal pain and constipation.  Genitourinary: Negative.   Musculoskeletal:        See HPI  Psychiatric/Behavioral:  Negative for behavioral problems and dysphoric mood.     Objective:  BP 112/69   Pulse 70   Ht 4' 10 (1.473 m)   Wt 161 lb 3.2 oz (73.1 kg)   SpO2 98%   BMI 33.69 kg/m      10/02/2023    9:06 AM 07/01/2023    8:55 AM 06/13/2023    3:06 PM  BP/Weight  Systolic BP 112 119 158  Diastolic BP 69 67 73  Wt. (Lbs) 161.2 161   BMI 33.69 kg/m2 33.65 kg/m2       Physical Exam Constitutional:      Appearance: She is well-developed.  Cardiovascular:     Rate and Rhythm: Normal rate.     Heart sounds: Normal  heart sounds. No murmur heard. Pulmonary:     Effort: Pulmonary effort is normal.     Breath sounds: Normal breath sounds. No wheezing or rales.  Chest:     Chest wall: No tenderness.  Abdominal:     General: Bowel sounds are normal. There is no distension.     Palpations: Abdomen is soft. There is no mass.     Tenderness: There is no abdominal tenderness.  Musculoskeletal:  General: Swelling (medial aspect of b/l knees) and tenderness (L knee on ROM) present.     Right lower leg: No edema.     Left lower leg: No edema.  Neurological:     Mental Status: She is alert and oriented to person, place, and time.  Psychiatric:        Mood and Affect: Mood normal.        Latest Ref Rng & Units 06/13/2023    3:35 PM 12/19/2022   10:40 AM 06/13/2022   10:18 AM  CMP  Glucose 70 - 99 mg/dL 845  897  92   BUN 8 - 23 mg/dL 17  11  10    Creatinine 0.44 - 1.00 mg/dL 8.85  9.22  9.29   Sodium 135 - 145 mmol/L 138  140  139   Potassium 3.5 - 5.1 mmol/L 4.0  4.6  4.8   Chloride 98 - 111 mmol/L 99  101  102   CO2 22 - 32 mmol/L 27  26  23    Calcium  8.9 - 10.3 mg/dL 9.6  9.7  9.3   Total Protein 6.5 - 8.1 g/dL 7.7  7.2  7.2   Total Bilirubin 0.0 - 1.2 mg/dL 0.4  0.3  0.3   Alkaline Phos 38 - 126 U/L 109  153  148   AST 15 - 41 U/L 19  13  13    ALT 0 - 44 U/L 18  10  16      Lipid Panel     Component Value Date/Time   CHOL 220 (H) 07/01/2023 0935   TRIG 171 (H) 07/01/2023 0935   HDL 67 07/01/2023 0935   CHOLHDL 3.4 05/24/2020 0918   CHOLHDL 3.4 02/19/2016 1021   VLDL 25 08/29/2015 0849   LDLCALC 123 (H) 07/01/2023 0935    CBC    Component Value Date/Time   WBC 8.7 06/13/2023 1535   RBC 5.64 (H) 06/13/2023 1535   HGB 14.6 06/13/2023 1535   HGB 12.2 08/27/2016 0919   HCT 45.6 06/13/2023 1535   HCT 38.2 08/27/2016 0919   PLT 361 06/13/2023 1535   PLT 355 08/27/2016 0919   MCV 80.9 06/13/2023 1535   MCV 79 08/27/2016 0919   MCH 25.9 (L) 06/13/2023 1535   MCHC 32.0  06/13/2023 1535   RDW 14.6 06/13/2023 1535   RDW 15.2 08/27/2016 0919   LYMPHSABS 2.3 06/13/2023 1535   LYMPHSABS 2.5 08/27/2016 0919   MONOABS 0.5 06/13/2023 1535   EOSABS 0.4 06/13/2023 1535   EOSABS 0.2 08/27/2016 0919   BASOSABS 0.1 06/13/2023 1535   BASOSABS 0.0 08/27/2016 0919    Lab Results  Component Value Date   HGBA1C 7.2 (A) 10/02/2023    Lab Results  Component Value Date   TSH 5.290 (H) 07/01/2023       Assessment & Plan Type 2 diabetes mellitus with other specified complications A1c improved to 7.2 from 8.1, indicating better glycemic control. - Continue current diabetes management regimen. - Monitor blood glucose levels regularly. -Counseled on Diabetic diet, the healthy plate, 849 minutes of moderate intensity exercise/week Blood sugar logs with fasting goals of 80-120 mg/dl, random of less than 819 and in the event of sugars less than 60 mg/dl or greater than 599 mg/dl encouraged to notify the clinic. Advised on the need for annual eye exams, annual foot exams, Pneumonia vaccine.   Primary osteoarthritis of both knees Chronic knee pain extending to the feet, exacerbated by cold weather. Current treatment with meloxicam   provides minimal relief. Declined referral to knee specialist for injection. - Prescribe tramadol  for additional pain relief at bedtime. - Advise use of warm compresses and massage for symptomatic relief. - Use knee braces  - Will order knee x-ray  Hypothyroidism Previous thyroid  levels were abnormal, leading to an increase in levothyroxine  dosage to 137 mcg. - Check thyroid  levels with today's test. - Adjust levothyroxine  dosage based on test results.  Hyperlipidemia associated with type 2 diabetes mellitus Previous cholesterol levels were high, necessitating re-evaluation. - Recheck cholesterol levels. - Continue statin   Hypertension associated with type 2 diabetes mellitus -Normal - Continue current regimen -Counseled on blood  pressure goal of less than 130/80, low-sodium, DASH diet, medication compliance, 150 minutes of moderate intensity exercise per week. Discussed medication compliance, adverse effects.  Abnormal vision Requires referral to a vision clinic. Coordination with referral coordinator is necessary to facilitate appointment scheduling. - Coordinate with referral coordinator to contact her daughter for vision clinic referral. - Daughter confirmed that patient is needing ophthalmology evaluation for cataract extraction.  Referral has been faxed to [facility]      Meds ordered this encounter  Medications   traMADol  (ULTRAM ) 50 MG tablet    Sig: Take 1 tablet (50 mg total) by mouth at bedtime as needed.    Dispense:  30 tablet    Refill:  1    Follow-up: Return in about 6 months (around 03/31/2024) for Chronic medical conditions.       Corrina Sabin, MD, FAAFP. [facility] [place], [place] [phone]   10/02/2023, 12:37 PM

## 2023-10-02 NOTE — Patient Instructions (Signed)
Artrosis Osteoarthritis  La artrosis es un tipo de artritis. Esta afeccin produce dolor o enfermedad en las articulaciones. La artrosis afecta al tejido que cubre los extremos de los huesos en las articulaciones (cartlago). El cartlago acta como amortiguador [employer] y los ayuda a moverse con suavidad. La artrosis se presenta cuando el cartlago de las articulaciones se gasta. A veces, la artrosis se denomina artritis "por uso y desgaste". La artrosis es la forma ms frecuente de artritis. A menudo, afecta a las [employer]. Es una afeccin que empeora con el [place]. Las articulaciones afectadas con mayor frecuencia por esta afeccin se encuentran en los dedos de [employer], los dedos de [employer], las caderas, las rodillas y la columna vertebral, incluyendo el cuello y la parte inferior de la espalda. Cules son las causas? Esta afeccin es causada por el desgaste del cartlago que cubre los extremos de [place]. Qu incrementa el riesgo? Los siguientes factores pueden hacer que sea ms propenso a Aeronautical engineer afeccin: Ser mayor de 50 aos. Obesidad. Uso excesivo de Nurse, learning disability. Lesin pasada de Risk analyst. Ciruga pasada en una articulacin. Antecedentes familiares de artrosis. Cules son los signos o sntomas? Los principales sntomas de esta enfermedad son dolor, hinchazn y Geophysical data processor. Otros sntomas pueden incluir: Agrandamiento de Nurse, learning disability. Aumento del dolor y dao adicional causado por pequeos trozos de [employer] o TEFL teacher que se desprenden y flotan dentro de Nurse, learning disability. Formacin de pequeos depsitos de hueso (osteofitos) en los extremos de Nurse, learning disability. Una sensacin de chirrido o raspado dentro de la articulacin al moverla. Sonidos de chasquido o crujido al [employer]. Dificultad para caminar o hacer ejercicio. Incapacidad para agarrar objetos, girar la mano o controlar los movimientos de las manos y los dedos. Cmo  se diagnostica? Esta afeccin se puede diagnosticar en funcin de lo siguiente: Sus antecedentes mdicos. Un examen fsico. Los sntomas. Radiografas de las articulaciones afectadas. Anlisis de sangre para descartar otros tipos de artritis. Cmo se trata? No hay cura para esta enfermedad, pero el tratamiento puede ayudar a Human resources officer y Scientist, clinical (histocompatibility and immunogenetics) el funcionamiento de Nurse, learning disability. El tratamiento puede incluir una combinacin de terapias, [place] las siguientes: Tcnicas de alivio del dolor, como: Aplicacin de calor y fro en la articulacin. Masajes. Una forma de psicoterapia llamada terapia cognitivo conductual (TCC). Esta terapia le ayuda a Consulting civil engineer y a Education officer, environmental un seguimiento de los cambios que hace. Analgsicos y antiinflamatorios. Los medicamentos pueden tomarse por boca o [employer]. Incluyen los siguientes: Antiinflamatorios no esteroideos (AINE), como el ibuprofeno. Medicamentos recetados. Antiinflamatorios fuertes (corticoesteroides). Ciertos suplementos nutricionales. Un programa de ejercicios recomendado. Puede trabajar con un fisioterapeuta. Dispositivos de [place], como un dispositivo ortopdico, una frula, un guante especial o un bastn. Un plan de control del peso. [place], como: Carolin Sicks. Se hace para volver a posicionar los [employer]e y Engineer, materials o para Oceanographer los trozos sueltos de hueso y TEFL teacher. Ciruga de reemplazo articular. Es posible que necesite esta ciruga si tiene una artrosis [place]. Siga estas indicaciones en su casa: Actividad Descanse las articulaciones afectadas como se lo haya indicado el mdico. Haga actividad fsica como se lo haya indicado el mdico. El mdico puede recomendar tipos especficos de ejercicios, por ejemplo: Ejercicios de fortalecimiento. Se realizan para fortalecer los msculos que sostienen las articulaciones afectadas por la artritis. Ejercicios aerbicos. Son ejercicios, como caminar a  paso ligero o hacer gimnasia [place] acutica, que aumentan la frecuencia cardaca. Actividades de  amplitud de movimientos. Estos ayudan a que las articulaciones se muevan con ms facilidad. Ejercicios de equilibrio y [place]. Control del dolor, la rigidez y la hinchazn     Si se lo indican, aplique calor en la zona afectada con la frecuencia que le haya dicho el mdico. Use la fuente de calor que el mdico le recomiende, como una compresa de calor hmedo o una almohadilla trmica. Si tiene un dispositivo de [employer] se puede quitar, [employer] segn lo indicado por su mdico. Coloque una toalla entre la piel y la fuente de Airline pilot. Si el mdico le indica que no se quite el dispositivo de USAA se Tax inspector, coloque una toalla entre el dispositivo de [place] y la fuente de Airline pilot. Aplique calor durante 20 a 30 minutos. Si se lo indican, aplique hielo en la zona afectada. Si tiene un dispositivo de ayuda que se puede quitar, [employer] segn lo indicado por su mdico. Ponga el hielo en una bolsa plstica. Coloque una toalla entre la piel y Copy. Si el mdico le indica que no se quite el dispositivo de USAA se aplica hielo, coloque una toalla entre el dispositivo de [place] y la bolsa de hielo. Aplique el hielo durante 20 minutos, 2 o 3 veces por da. Si la piel se le pone de color rojo brillante, retire el hielo o Company secretary de inmediato para evitar daos en la piel. El [place] de dao es mayor si no puede sentir dolor, Airline pilot o fro. Mueva los dedos de las manos o de los pies con frecuencia para reducir la rigidez y la hinchazn. Cuando est sentado o acostado, levante (eleve) la zona afectada por encima del nivel del corazn. Indicaciones generales Use los medicamentos de venta libre y los recetados solamente como se lo haya indicado el mdico. Mantenga un peso saludable. Siga las instrucciones del mdico con respecto al control del [place]. No consuma ningn producto que  contenga nicotina o tabaco. Estos productos incluyen cigarrillos, tabaco para Theatre manager y aparatos de vapeo, como los Administrator, Civil Service. Si necesita ayuda para dejar de consumir estos productos, consulte al American Express. Use los dispositivos de ayuda como se lo haya indicado el mdico. Dnde buscar ms informacin [employer] Mills of Arthritis and Musculoskeletal and Skin Diseases (Instituto [employer] de Artritis y [place] Musculoesquelticas y Arboriculturist): niams.http://www.myers.net/ [employer] Mills on Aging Lawyer sobre el Envejecimiento): BaseRingTones.pl Celanese Corporation of Rheumatology (Instituto Estadounidense de Reumatologa): rheumatology.org Comunquese con un mdico si: Tiene enrojecimiento, hinchazn o sensacin de calor que empeora en una articulacin. Tiene fiebre y siente dolor en la articulacin o el msculo. Presenta una erupcin cutnea. Tiene dificultad para [employer] cotidianas. Siente dolor que empeora y no se alivia con los analgsicos. Esta informacin no tiene Theme park manager el consejo del mdico. Asegrese de hacerle al mdico cualquier pregunta que tenga. Document Revised: 10/02/2021 Document Reviewed: 10/02/2021 Elsevier Patient Education  2024 [employer].

## 2023-10-03 ENCOUNTER — Ambulatory Visit: Payer: Self-pay | Admitting: Family Medicine

## 2023-10-03 ENCOUNTER — Other Ambulatory Visit: Payer: Self-pay

## 2023-10-03 DIAGNOSIS — E039 Hypothyroidism, unspecified: Secondary | ICD-10-CM

## 2023-10-03 LAB — LP+NON-HDL CHOLESTEROL
Cholesterol, Total: 193 mg/dL (ref 100–199)
HDL: 56 mg/dL (ref >39)
LDL Chol Calc (NIH): 109 mg/dL — ABNORMAL HIGH (ref 0–99)
Total Non-HDL-Chol (LDL+VLDL): 137 mg/dL — ABNORMAL HIGH (ref 0–129)
Triglycerides: 161 mg/dL — ABNORMAL HIGH (ref 0–149)
VLDL Cholesterol Cal: 28 mg/dL (ref 5–40)

## 2023-10-03 LAB — T3: T3, Total: 143 ng/dL (ref 71–180)

## 2023-10-03 LAB — CMP14+EGFR
ALT: 16 IU/L (ref 0–32)
AST: 18 IU/L (ref 0–40)
Albumin: 4.3 g/dL (ref 3.8–4.8)
Alkaline Phosphatase: 127 IU/L (ref 49–135)
BUN/Creatinine Ratio: 28 (ref 12–28)
BUN: 23 mg/dL (ref 8–27)
Bilirubin Total: 0.2 mg/dL (ref 0.0–1.2)
CO2: 23 mmol/L (ref 20–29)
Calcium: 9.5 mg/dL (ref 8.7–10.3)
Chloride: 102 mmol/L (ref 96–106)
Creatinine, Ser: 0.81 mg/dL (ref 0.57–1.00)
Globulin, Total: 3 g/dL (ref 1.5–4.5)
Glucose: 79 mg/dL (ref 70–99)
Potassium: 4.6 mmol/L (ref 3.5–5.2)
Sodium: 140 mmol/L (ref 134–144)
Total Protein: 7.3 g/dL (ref 6.0–8.5)
eGFR: 75 mL/min/1.73 (ref >59)

## 2023-10-03 LAB — T4, FREE: Free T4: 1.82 ng/dL — ABNORMAL HIGH (ref 0.82–1.77)

## 2023-10-03 LAB — TSH: TSH: 0.423 u[IU]/mL — ABNORMAL LOW (ref 0.450–4.500)

## 2023-10-03 MED ORDER — LEVOTHYROXINE SODIUM 137 MCG PO TABS
125.0000 ug | ORAL_TABLET | Freq: Every day | ORAL | 1 refills | Status: DC
  Filled 2023-10-03: qty 90, 90d supply, fill #0

## 2023-10-03 MED ORDER — LEVOTHYROXINE SODIUM 125 MCG PO TABS
125.0000 ug | ORAL_TABLET | Freq: Every day | ORAL | 1 refills | Status: AC
  Filled 2023-10-03: qty 90, 90d supply, fill #0
  Filled 2024-01-05: qty 90, 90d supply, fill #1

## 2023-12-29 ENCOUNTER — Other Ambulatory Visit: Payer: Self-pay

## 2024-01-05 ENCOUNTER — Other Ambulatory Visit: Payer: Self-pay

## 2024-01-05 ENCOUNTER — Other Ambulatory Visit (HOSPITAL_COMMUNITY): Payer: Self-pay

## 2024-01-06 ENCOUNTER — Other Ambulatory Visit: Payer: Self-pay

## 2024-01-07 ENCOUNTER — Other Ambulatory Visit: Payer: Self-pay

## 2024-01-13 ENCOUNTER — Other Ambulatory Visit: Payer: Self-pay

## 2024-01-19 ENCOUNTER — Other Ambulatory Visit: Payer: Self-pay

## 2024-04-01 ENCOUNTER — Ambulatory Visit: Payer: Self-pay | Admitting: Family Medicine
